# Patient Record
Sex: Female | Born: 1985 | Race: Black or African American | Hispanic: No | Marital: Single | Smoking: Never smoker
Health system: Southern US, Community
[De-identification: ages and names within clinical notes are randomized; demographics above are authoritative.]

## PROBLEM LIST (undated history)

## (undated) ENCOUNTER — Inpatient Hospital Stay (HOSPITAL_COMMUNITY): Payer: Self-pay

## (undated) DIAGNOSIS — N390 Urinary tract infection, site not specified: Secondary | ICD-10-CM

## (undated) DIAGNOSIS — B379 Candidiasis, unspecified: Secondary | ICD-10-CM

## (undated) DIAGNOSIS — B9689 Other specified bacterial agents as the cause of diseases classified elsewhere: Secondary | ICD-10-CM

## (undated) DIAGNOSIS — N760 Acute vaginitis: Secondary | ICD-10-CM

## (undated) DIAGNOSIS — A599 Trichomoniasis, unspecified: Secondary | ICD-10-CM

## (undated) DIAGNOSIS — R51 Headache: Secondary | ICD-10-CM

## (undated) DIAGNOSIS — A749 Chlamydial infection, unspecified: Secondary | ICD-10-CM

## (undated) DIAGNOSIS — A549 Gonococcal infection, unspecified: Secondary | ICD-10-CM

## (undated) HISTORY — PX: DILATION AND CURETTAGE OF UTERUS: SHX78

## (undated) HISTORY — PX: INDUCED ABORTION: SHX677

---
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
## 2005-04-19 ENCOUNTER — Inpatient Hospital Stay (HOSPITAL_COMMUNITY): Admission: AD | Admit: 2005-04-19 | Discharge: 2005-04-20 | Payer: Self-pay | Admitting: Obstetrics and Gynecology

## 2005-05-04 ENCOUNTER — Emergency Department (HOSPITAL_COMMUNITY): Admission: EM | Admit: 2005-05-04 | Discharge: 2005-05-04 | Payer: Self-pay | Admitting: Emergency Medicine

## 2005-05-22 ENCOUNTER — Inpatient Hospital Stay (HOSPITAL_COMMUNITY): Admission: AD | Admit: 2005-05-22 | Discharge: 2005-05-22 | Payer: Self-pay | Admitting: Obstetrics and Gynecology

## 2005-06-10 ENCOUNTER — Other Ambulatory Visit: Admission: RE | Admit: 2005-06-10 | Discharge: 2005-06-10 | Payer: Self-pay | Admitting: Obstetrics and Gynecology

## 2005-08-17 ENCOUNTER — Emergency Department (HOSPITAL_COMMUNITY): Admission: EM | Admit: 2005-08-17 | Discharge: 2005-08-17 | Payer: Self-pay | Admitting: Emergency Medicine

## 2005-08-23 ENCOUNTER — Emergency Department (HOSPITAL_COMMUNITY): Admission: EM | Admit: 2005-08-23 | Discharge: 2005-08-23 | Payer: Self-pay | Admitting: Emergency Medicine

## 2006-11-07 ENCOUNTER — Inpatient Hospital Stay (HOSPITAL_COMMUNITY): Admission: AD | Admit: 2006-11-07 | Discharge: 2006-11-07 | Payer: Self-pay | Admitting: Family Medicine

## 2007-01-16 ENCOUNTER — Inpatient Hospital Stay (HOSPITAL_COMMUNITY): Admission: AD | Admit: 2007-01-16 | Discharge: 2007-01-16 | Payer: Self-pay | Admitting: Gynecology

## 2007-06-18 ENCOUNTER — Inpatient Hospital Stay (HOSPITAL_COMMUNITY): Admission: AD | Admit: 2007-06-18 | Discharge: 2007-06-18 | Payer: Self-pay | Admitting: Obstetrics and Gynecology

## 2007-06-25 ENCOUNTER — Inpatient Hospital Stay (HOSPITAL_COMMUNITY): Admission: AD | Admit: 2007-06-25 | Discharge: 2007-06-26 | Payer: Self-pay | Admitting: Obstetrics & Gynecology

## 2007-06-30 ENCOUNTER — Inpatient Hospital Stay (HOSPITAL_COMMUNITY): Admission: AD | Admit: 2007-06-30 | Discharge: 2007-06-30 | Payer: Self-pay | Admitting: Obstetrics and Gynecology

## 2007-07-04 ENCOUNTER — Inpatient Hospital Stay (HOSPITAL_COMMUNITY): Admission: AD | Admit: 2007-07-04 | Discharge: 2007-07-06 | Payer: Self-pay | Admitting: Obstetrics and Gynecology

## 2007-08-08 ENCOUNTER — Emergency Department (HOSPITAL_COMMUNITY): Admission: EM | Admit: 2007-08-08 | Discharge: 2007-08-08 | Payer: Self-pay | Admitting: Emergency Medicine

## 2007-10-06 ENCOUNTER — Inpatient Hospital Stay (HOSPITAL_COMMUNITY): Admission: AD | Admit: 2007-10-06 | Discharge: 2007-10-06 | Payer: Self-pay | Admitting: Obstetrics & Gynecology

## 2007-11-17 ENCOUNTER — Inpatient Hospital Stay (HOSPITAL_COMMUNITY): Admission: AD | Admit: 2007-11-17 | Discharge: 2007-11-17 | Payer: Self-pay | Admitting: Obstetrics & Gynecology

## 2008-05-18 ENCOUNTER — Inpatient Hospital Stay (HOSPITAL_COMMUNITY): Admission: AD | Admit: 2008-05-18 | Discharge: 2008-05-18 | Payer: Self-pay | Admitting: Obstetrics & Gynecology

## 2009-01-13 ENCOUNTER — Ambulatory Visit: Payer: Self-pay | Admitting: Physician Assistant

## 2009-01-13 ENCOUNTER — Inpatient Hospital Stay (HOSPITAL_COMMUNITY): Admission: AD | Admit: 2009-01-13 | Discharge: 2009-01-13 | Payer: Self-pay | Admitting: Obstetrics & Gynecology

## 2009-01-15 ENCOUNTER — Inpatient Hospital Stay (HOSPITAL_COMMUNITY): Admission: AD | Admit: 2009-01-15 | Discharge: 2009-01-15 | Payer: Self-pay | Admitting: Obstetrics & Gynecology

## 2009-04-06 ENCOUNTER — Inpatient Hospital Stay (HOSPITAL_COMMUNITY): Admission: AD | Admit: 2009-04-06 | Discharge: 2009-04-06 | Payer: Self-pay | Admitting: Family Medicine

## 2009-07-01 ENCOUNTER — Emergency Department (HOSPITAL_COMMUNITY): Admission: EM | Admit: 2009-07-01 | Discharge: 2009-07-01 | Payer: Self-pay | Admitting: Emergency Medicine

## 2009-08-07 ENCOUNTER — Inpatient Hospital Stay (HOSPITAL_COMMUNITY): Admission: AD | Admit: 2009-08-07 | Discharge: 2009-08-07 | Payer: Self-pay | Admitting: Obstetrics & Gynecology

## 2009-08-19 ENCOUNTER — Inpatient Hospital Stay (HOSPITAL_COMMUNITY): Admission: AD | Admit: 2009-08-19 | Discharge: 2009-08-19 | Payer: Self-pay | Admitting: Obstetrics & Gynecology

## 2009-11-27 ENCOUNTER — Emergency Department (HOSPITAL_COMMUNITY): Admission: EM | Admit: 2009-11-27 | Discharge: 2009-11-27 | Payer: Self-pay | Admitting: Family Medicine

## 2010-01-15 ENCOUNTER — Inpatient Hospital Stay (HOSPITAL_COMMUNITY): Admission: AD | Admit: 2010-01-15 | Discharge: 2010-01-15 | Payer: Self-pay | Admitting: Family Medicine

## 2010-04-22 ENCOUNTER — Emergency Department (HOSPITAL_COMMUNITY): Admission: EM | Admit: 2010-04-22 | Discharge: 2010-04-22 | Payer: Self-pay | Admitting: Emergency Medicine

## 2010-06-26 ENCOUNTER — Emergency Department (HOSPITAL_COMMUNITY): Admission: EM | Admit: 2010-06-26 | Discharge: 2010-06-26 | Payer: Self-pay | Admitting: Emergency Medicine

## 2010-08-08 ENCOUNTER — Inpatient Hospital Stay (HOSPITAL_COMMUNITY)
Admission: AD | Admit: 2010-08-08 | Discharge: 2010-08-08 | Payer: Self-pay | Source: Home / Self Care | Attending: Obstetrics and Gynecology | Admitting: Obstetrics and Gynecology

## 2010-09-25 ENCOUNTER — Inpatient Hospital Stay (INDEPENDENT_AMBULATORY_CARE_PROVIDER_SITE_OTHER)
Admission: RE | Admit: 2010-09-25 | Discharge: 2010-09-25 | Disposition: A | Discharge status: Home / Self Care | Payer: Self-pay | Source: Ambulatory Visit | Attending: Family Medicine | Admitting: Family Medicine

## 2010-09-25 LAB — WET PREP, GENITAL
Trich, Wet Prep: NONE SEEN
Yeast Wet Prep HPF POC: NONE SEEN

## 2010-09-25 LAB — POCT URINALYSIS DIPSTICK
Hgb urine dipstick: NEGATIVE
Ketones, ur: NEGATIVE mg/dL
Nitrite: NEGATIVE
Protein, ur: 30 mg/dL — AB
Specific Gravity, Urine: 1.02 (ref 1.005–1.030)
Urine Glucose, Fasting: NEGATIVE mg/dL
Urobilinogen, UA: 2 mg/dL — ABNORMAL HIGH (ref 0.0–1.0)
pH: 7 (ref 5.0–8.0)

## 2010-09-25 LAB — POCT PREGNANCY, URINE: Preg Test, Ur: NEGATIVE

## 2010-09-26 LAB — URINE CULTURE
Colony Count: 5000
Culture  Setup Time: 201202141838

## 2010-09-26 LAB — GC/CHLAMYDIA PROBE AMP, GENITAL
Chlamydia, DNA Probe: NEGATIVE
GC Probe Amp, Genital: NEGATIVE

## 2010-10-22 LAB — URINALYSIS, ROUTINE W REFLEX MICROSCOPIC
Bilirubin Urine: NEGATIVE
Glucose, UA: NEGATIVE mg/dL
Hgb urine dipstick: NEGATIVE
Ketones, ur: NEGATIVE mg/dL
Nitrite: NEGATIVE
Protein, ur: NEGATIVE mg/dL
Specific Gravity, Urine: 1.025 (ref 1.005–1.030)
Urobilinogen, UA: 0.2 mg/dL (ref 0.0–1.0)
pH: 6 (ref 5.0–8.0)

## 2010-10-22 LAB — POCT PREGNANCY, URINE: Preg Test, Ur: NEGATIVE

## 2010-10-23 LAB — POCT URINALYSIS DIPSTICK
Bilirubin Urine: NEGATIVE
Glucose, UA: NEGATIVE mg/dL
Hgb urine dipstick: NEGATIVE
Ketones, ur: NEGATIVE mg/dL
Nitrite: NEGATIVE
Protein, ur: NEGATIVE mg/dL
Specific Gravity, Urine: 1.015 (ref 1.005–1.030)
Urobilinogen, UA: 0.2 mg/dL (ref 0.0–1.0)
pH: 6 (ref 5.0–8.0)

## 2010-10-23 LAB — WET PREP, GENITAL
Trich, Wet Prep: NONE SEEN
Yeast Wet Prep HPF POC: NONE SEEN

## 2010-10-23 LAB — GC/CHLAMYDIA PROBE AMP, GENITAL
Chlamydia, DNA Probe: NEGATIVE
GC Probe Amp, Genital: NEGATIVE

## 2010-10-23 LAB — POCT PREGNANCY, URINE: Preg Test, Ur: NEGATIVE

## 2010-10-25 LAB — URINE CULTURE
Colony Count: 30000
Culture  Setup Time: 201109112212

## 2010-10-25 LAB — POCT PREGNANCY, URINE: Preg Test, Ur: NEGATIVE

## 2010-10-25 LAB — GC/CHLAMYDIA PROBE AMP, GENITAL
Chlamydia, DNA Probe: NEGATIVE
GC Probe Amp, Genital: NEGATIVE

## 2010-10-25 LAB — POCT URINALYSIS DIPSTICK
Bilirubin Urine: NEGATIVE
Glucose, UA: NEGATIVE mg/dL
Hgb urine dipstick: NEGATIVE
Ketones, ur: NEGATIVE mg/dL
Nitrite: NEGATIVE
Protein, ur: NEGATIVE mg/dL
Specific Gravity, Urine: 1.02 (ref 1.005–1.030)
Urobilinogen, UA: 1 mg/dL (ref 0.0–1.0)
pH: 6 (ref 5.0–8.0)

## 2010-10-25 LAB — WET PREP, GENITAL: Trich, Wet Prep: NONE SEEN

## 2010-10-28 LAB — URINALYSIS, ROUTINE W REFLEX MICROSCOPIC
Bilirubin Urine: NEGATIVE
Glucose, UA: NEGATIVE mg/dL
Hgb urine dipstick: NEGATIVE
Ketones, ur: NEGATIVE mg/dL
Nitrite: NEGATIVE
Protein, ur: NEGATIVE mg/dL
Specific Gravity, Urine: <1.005 — ABNORMAL LOW (ref 1.005–1.030)
Urobilinogen, UA: 0.2 mg/dL (ref 0.0–1.0)
pH: 5 (ref 5.0–8.0)

## 2010-10-28 LAB — HERPES SIMPLEX VIRUS CULTURE: Culture: NOT DETECTED

## 2010-10-28 LAB — URINE CULTURE

## 2010-10-28 LAB — WET PREP, GENITAL
Trich, Wet Prep: NONE SEEN
Yeast Wet Prep HPF POC: NONE SEEN

## 2010-10-28 LAB — URINE MICROSCOPIC-ADD ON

## 2010-10-28 LAB — GC/CHLAMYDIA PROBE AMP, GENITAL
Chlamydia, DNA Probe: POSITIVE — AB
GC Probe Amp, Genital: NEGATIVE

## 2010-10-29 LAB — URINE MICROSCOPIC-ADD ON

## 2010-10-29 LAB — URINALYSIS, ROUTINE W REFLEX MICROSCOPIC
Bilirubin Urine: NEGATIVE
Glucose, UA: NEGATIVE mg/dL
Ketones, ur: NEGATIVE mg/dL
Nitrite: NEGATIVE
Protein, ur: NEGATIVE mg/dL
Specific Gravity, Urine: 1.02 (ref 1.005–1.030)
Urobilinogen, UA: 0.2 mg/dL (ref 0.0–1.0)
pH: 6 (ref 5.0–8.0)

## 2010-10-29 LAB — GC/CHLAMYDIA PROBE AMP, GENITAL
Chlamydia, DNA Probe: NEGATIVE
GC Probe Amp, Genital: NEGATIVE

## 2010-10-29 LAB — WET PREP, GENITAL
Trich, Wet Prep: NONE SEEN
Yeast Wet Prep HPF POC: NONE SEEN

## 2010-10-29 LAB — POCT PREGNANCY, URINE: Preg Test, Ur: NEGATIVE

## 2010-10-30 LAB — POCT PREGNANCY, URINE: Preg Test, Ur: NEGATIVE

## 2010-10-30 LAB — GC/CHLAMYDIA PROBE AMP, GENITAL
Chlamydia, DNA Probe: NEGATIVE
GC Probe Amp, Genital: NEGATIVE

## 2010-10-30 LAB — POCT URINALYSIS DIP (DEVICE)
Bilirubin Urine: NEGATIVE
Glucose, UA: NEGATIVE mg/dL
Ketones, ur: NEGATIVE mg/dL
Nitrite: NEGATIVE
Protein, ur: NEGATIVE mg/dL
Specific Gravity, Urine: <=1.005 (ref 1.005–1.030)
Urobilinogen, UA: 0.2 mg/dL (ref 0.0–1.0)
pH: 5.5 (ref 5.0–8.0)

## 2010-11-12 LAB — URINALYSIS, ROUTINE W REFLEX MICROSCOPIC
Bilirubin Urine: NEGATIVE
Glucose, UA: NEGATIVE mg/dL
Hgb urine dipstick: NEGATIVE
Ketones, ur: NEGATIVE mg/dL
Nitrite: NEGATIVE
Protein, ur: NEGATIVE mg/dL
Specific Gravity, Urine: >1.03 — ABNORMAL HIGH (ref 1.005–1.030)
Urobilinogen, UA: 0.2 mg/dL (ref 0.0–1.0)
pH: 6 (ref 5.0–8.0)

## 2010-11-12 LAB — COMPREHENSIVE METABOLIC PANEL
ALT: 16 U/L (ref 0–35)
AST: 20 U/L (ref 0–37)
Albumin: 4 g/dL (ref 3.5–5.2)
Alkaline Phosphatase: 35 U/L — ABNORMAL LOW (ref 39–117)
BUN: 10 mg/dL (ref 6–23)
CO2: 26 mEq/L (ref 19–32)
Calcium: 8.9 mg/dL (ref 8.4–10.5)
Chloride: 105 mEq/L (ref 96–112)
Creatinine, Ser: 0.81 mg/dL (ref 0.4–1.2)
GFR calc Af Amer: >60 mL/min (ref >60)
GFR calc non Af Amer: >60 mL/min (ref >60)
Glucose, Bld: 82 mg/dL (ref 70–99)
Potassium: 4.1 mEq/L (ref 3.5–5.1)
Sodium: 135 mEq/L (ref 135–145)
Total Bilirubin: 1 mg/dL (ref 0.3–1.2)
Total Protein: 7.3 g/dL (ref 6.0–8.3)

## 2010-11-12 LAB — CBC
HCT: 38.6 % (ref 36.0–46.0)
Hemoglobin: 12.2 g/dL (ref 12.0–15.0)
MCHC: 31.7 g/dL (ref 30.0–36.0)
MCV: 79.6 fL (ref 78.0–100.0)
Platelets: 305 10*3/uL (ref 150–400)
RBC: 4.85 MIL/uL (ref 3.87–5.11)
RDW: 14.8 % (ref 11.5–15.5)
WBC: 7.9 10*3/uL (ref 4.0–10.5)

## 2010-11-12 LAB — POCT PREGNANCY, URINE: Preg Test, Ur: POSITIVE

## 2010-11-14 LAB — POCT URINALYSIS DIP (DEVICE)
Bilirubin Urine: NEGATIVE
Glucose, UA: NEGATIVE mg/dL
Hgb urine dipstick: NEGATIVE
Ketones, ur: NEGATIVE mg/dL
Nitrite: NEGATIVE
Protein, ur: NEGATIVE mg/dL
Specific Gravity, Urine: 1.02 (ref 1.005–1.030)
Urobilinogen, UA: 1 mg/dL (ref 0.0–1.0)
pH: 5.5 (ref 5.0–8.0)

## 2010-11-14 LAB — GC/CHLAMYDIA PROBE AMP, GENITAL
Chlamydia, DNA Probe: NEGATIVE
GC Probe Amp, Genital: NEGATIVE

## 2010-11-14 LAB — WET PREP, GENITAL
Clue Cells Wet Prep HPF POC: NONE SEEN
Trich, Wet Prep: NONE SEEN
Yeast Wet Prep HPF POC: NONE SEEN

## 2010-11-14 LAB — POCT PREGNANCY, URINE: Preg Test, Ur: NEGATIVE

## 2010-11-17 LAB — CBC
HCT: 37.7 % (ref 36.0–46.0)
Hemoglobin: 12.1 g/dL (ref 12.0–15.0)
MCHC: 32 g/dL (ref 30.0–36.0)
MCV: 77.3 fL — ABNORMAL LOW (ref 78.0–100.0)
Platelets: 289 10*3/uL (ref 150–400)
RBC: 4.88 MIL/uL (ref 3.87–5.11)
RDW: 16.4 % — ABNORMAL HIGH (ref 11.5–15.5)
WBC: 8 10*3/uL (ref 4.0–10.5)

## 2010-11-17 LAB — WET PREP, GENITAL
Trich, Wet Prep: NONE SEEN
Yeast Wet Prep HPF POC: NONE SEEN

## 2010-11-17 LAB — URINALYSIS, ROUTINE W REFLEX MICROSCOPIC
Bilirubin Urine: NEGATIVE
Glucose, UA: NEGATIVE mg/dL
Ketones, ur: 15 mg/dL — AB
Nitrite: NEGATIVE
Protein, ur: NEGATIVE mg/dL
Specific Gravity, Urine: >1.03 — ABNORMAL HIGH (ref 1.005–1.030)
Urobilinogen, UA: 2 mg/dL — ABNORMAL HIGH (ref 0.0–1.0)
pH: 6 (ref 5.0–8.0)

## 2010-11-17 LAB — ABO/RH: ABO/RH(D): A POS

## 2010-11-17 LAB — POCT PREGNANCY, URINE: Preg Test, Ur: POSITIVE

## 2010-11-17 LAB — GC/CHLAMYDIA PROBE AMP, GENITAL
Chlamydia, DNA Probe: NEGATIVE
GC Probe Amp, Genital: NEGATIVE

## 2010-11-17 LAB — URINE MICROSCOPIC-ADD ON

## 2010-11-17 LAB — HCG, QUANTITATIVE, PREGNANCY: hCG, Beta Chain, Quant, S: 52968 m[IU]/mL — ABNORMAL HIGH (ref <5)

## 2010-11-19 LAB — URINE MICROSCOPIC-ADD ON: RBC / HPF: NONE SEEN RBC/hpf (ref <3)

## 2010-11-19 LAB — URINALYSIS, ROUTINE W REFLEX MICROSCOPIC
Bilirubin Urine: NEGATIVE
Glucose, UA: NEGATIVE mg/dL
Hgb urine dipstick: NEGATIVE
Ketones, ur: NEGATIVE mg/dL
Nitrite: NEGATIVE
Protein, ur: NEGATIVE mg/dL
Specific Gravity, Urine: 1.015 (ref 1.005–1.030)
Urobilinogen, UA: 0.2 mg/dL (ref 0.0–1.0)
pH: 6 (ref 5.0–8.0)

## 2010-11-19 LAB — WET PREP, GENITAL
Clue Cells Wet Prep HPF POC: NONE SEEN
Trich, Wet Prep: NONE SEEN
Yeast Wet Prep HPF POC: NONE SEEN

## 2010-11-19 LAB — GC/CHLAMYDIA PROBE AMP, GENITAL
Chlamydia, DNA Probe: POSITIVE — AB
GC Probe Amp, Genital: POSITIVE — AB

## 2010-12-01 ENCOUNTER — Inpatient Hospital Stay (HOSPITAL_COMMUNITY)
Admission: RE | Admit: 2010-12-01 | Discharge: 2010-12-01 | Disposition: A | Discharge status: Home / Self Care | Payer: Self-pay | Source: Ambulatory Visit | Attending: Obstetrics and Gynecology | Admitting: Obstetrics and Gynecology

## 2010-12-01 DIAGNOSIS — A499 Bacterial infection, unspecified: Secondary | ICD-10-CM | POA: Insufficient documentation

## 2010-12-01 DIAGNOSIS — B9689 Other specified bacterial agents as the cause of diseases classified elsewhere: Secondary | ICD-10-CM | POA: Insufficient documentation

## 2010-12-01 DIAGNOSIS — R109 Unspecified abdominal pain: Secondary | ICD-10-CM

## 2010-12-01 DIAGNOSIS — N390 Urinary tract infection, site not specified: Secondary | ICD-10-CM | POA: Insufficient documentation

## 2010-12-01 DIAGNOSIS — N760 Acute vaginitis: Secondary | ICD-10-CM | POA: Insufficient documentation

## 2010-12-01 LAB — WET PREP, GENITAL
Trich, Wet Prep: NONE SEEN
Yeast Wet Prep HPF POC: NONE SEEN

## 2010-12-01 LAB — URINALYSIS, ROUTINE W REFLEX MICROSCOPIC
Bilirubin Urine: NEGATIVE
Glucose, UA: NEGATIVE mg/dL
Hgb urine dipstick: NEGATIVE
Ketones, ur: 15 mg/dL — AB
Nitrite: NEGATIVE
Protein, ur: NEGATIVE mg/dL
Specific Gravity, Urine: 1.025 (ref 1.005–1.030)
Urobilinogen, UA: 0.2 mg/dL (ref 0.0–1.0)
pH: 6 (ref 5.0–8.0)

## 2010-12-01 LAB — URINE MICROSCOPIC-ADD ON

## 2010-12-01 LAB — POCT PREGNANCY, URINE: Preg Test, Ur: NEGATIVE

## 2010-12-02 LAB — URINE CULTURE
Colony Count: 45000
Culture  Setup Time: 201204211141

## 2010-12-03 LAB — GC/CHLAMYDIA PROBE AMP, GENITAL
Chlamydia, DNA Probe: NEGATIVE
GC Probe Amp, Genital: NEGATIVE

## 2011-01-23 ENCOUNTER — Inpatient Hospital Stay (INDEPENDENT_AMBULATORY_CARE_PROVIDER_SITE_OTHER)
Admission: RE | Admit: 2011-01-23 | Discharge: 2011-01-23 | Disposition: A | Discharge status: Home / Self Care | Payer: Self-pay | Source: Ambulatory Visit | Attending: Family Medicine | Admitting: Family Medicine

## 2011-01-23 DIAGNOSIS — L03319 Cellulitis of trunk, unspecified: Secondary | ICD-10-CM

## 2011-01-23 DIAGNOSIS — L02219 Cutaneous abscess of trunk, unspecified: Secondary | ICD-10-CM

## 2011-01-23 LAB — POCT URINALYSIS DIP (DEVICE)
Bilirubin Urine: NEGATIVE
Glucose, UA: NEGATIVE mg/dL
Hgb urine dipstick: NEGATIVE
Ketones, ur: NEGATIVE mg/dL
Nitrite: NEGATIVE
Protein, ur: NEGATIVE mg/dL
Specific Gravity, Urine: 1.02 (ref 1.005–1.030)
Urobilinogen, UA: 1 mg/dL (ref 0.0–1.0)
pH: 7.5 (ref 5.0–8.0)

## 2011-01-23 LAB — POCT PREGNANCY, URINE: Preg Test, Ur: NEGATIVE

## 2011-01-26 LAB — CULTURE, ROUTINE-ABSCESS

## 2011-02-03 ENCOUNTER — Inpatient Hospital Stay (INDEPENDENT_AMBULATORY_CARE_PROVIDER_SITE_OTHER)
Admission: RE | Admit: 2011-02-03 | Discharge: 2011-02-03 | Disposition: A | Discharge status: Home / Self Care | Payer: Self-pay | Source: Ambulatory Visit | Attending: Emergency Medicine | Admitting: Emergency Medicine

## 2011-02-03 DIAGNOSIS — L03319 Cellulitis of trunk, unspecified: Secondary | ICD-10-CM

## 2011-02-03 DIAGNOSIS — L02219 Cutaneous abscess of trunk, unspecified: Secondary | ICD-10-CM

## 2011-02-03 DIAGNOSIS — N390 Urinary tract infection, site not specified: Secondary | ICD-10-CM

## 2011-02-03 LAB — POCT PREGNANCY, URINE: Preg Test, Ur: NEGATIVE

## 2011-02-03 LAB — POCT URINALYSIS DIP (DEVICE)
Glucose, UA: NEGATIVE mg/dL
Ketones, ur: NEGATIVE mg/dL
Nitrite: POSITIVE — AB
Protein, ur: 30 mg/dL — AB
Specific Gravity, Urine: >=1.03 (ref 1.005–1.030)
Urobilinogen, UA: 1 mg/dL (ref 0.0–1.0)
pH: 5.5 (ref 5.0–8.0)

## 2011-03-08 ENCOUNTER — Inpatient Hospital Stay (HOSPITAL_COMMUNITY)
Admission: AD | Admit: 2011-03-08 | Discharge: 2011-03-08 | Disposition: A | Discharge status: Home / Self Care | Payer: Self-pay | Source: Ambulatory Visit | Attending: Obstetrics & Gynecology | Admitting: Obstetrics & Gynecology

## 2011-03-08 ENCOUNTER — Inpatient Hospital Stay (HOSPITAL_COMMUNITY): Payer: Self-pay

## 2011-03-08 ENCOUNTER — Encounter (HOSPITAL_COMMUNITY): Payer: Self-pay | Admitting: *Deleted

## 2011-03-08 ENCOUNTER — Inpatient Hospital Stay (INDEPENDENT_AMBULATORY_CARE_PROVIDER_SITE_OTHER)
Admission: RE | Admit: 2011-03-08 | Discharge: 2011-03-08 | Disposition: A | Discharge status: Home / Self Care | Payer: Self-pay | Source: Ambulatory Visit | Attending: Family Medicine | Admitting: Family Medicine

## 2011-03-08 DIAGNOSIS — R1032 Left lower quadrant pain: Secondary | ICD-10-CM

## 2011-03-08 DIAGNOSIS — O9989 Other specified diseases and conditions complicating pregnancy, childbirth and the puerperium: Secondary | ICD-10-CM | POA: Insufficient documentation

## 2011-03-08 DIAGNOSIS — N949 Unspecified condition associated with female genital organs and menstrual cycle: Secondary | ICD-10-CM

## 2011-03-08 LAB — POCT URINALYSIS DIP (DEVICE)
Glucose, UA: NEGATIVE mg/dL
Nitrite: NEGATIVE
Protein, ur: 30 mg/dL — AB
Specific Gravity, Urine: >=1.03 (ref 1.005–1.030)
Urobilinogen, UA: 2 mg/dL — ABNORMAL HIGH (ref 0.0–1.0)
pH: 6 (ref 5.0–8.0)

## 2011-03-08 LAB — WET PREP, GENITAL
Trich, Wet Prep: NONE SEEN
Yeast Wet Prep HPF POC: NONE SEEN

## 2011-03-08 LAB — POCT PREGNANCY, URINE: Preg Test, Ur: NEGATIVE

## 2011-03-08 NOTE — Progress Notes (Signed)
Vag discharge x 1-2 weeks, no burning or itching.  Took OTC BV treatment - had intense burning & itching, then started bleeding.  Seen at [facility] today, dx'd with UTI, given prescriptions for Bactrim & Flagyl, sent here for U/S.

## 2011-03-08 NOTE — ED Notes (Signed)
Pt states she has no appetite & has not eaten in 2 days.  Denies N&V.  Has not had BM since Monday.

## 2011-03-08 NOTE — Progress Notes (Signed)
Pt states, " I was just seen a Urgent care and they sent me here for an [place]. They said they called ahead."

## 2011-03-08 NOTE — ED Provider Notes (Signed)
History   Pt was sent from [facility] for pelvic US. She was seen for vag dc and was diagnosed with a UTI and BV. However, on exam she was tender to palpation in left adnexa. There was some concern of ovarian torsion so she presents for evaluation. She denies pain at this time and states the pain is mainly when she had her exam.  Chief Complaint  Patient presents with  . Abdominal Pain   HPI  OB History    Grav Para Term Preterm Abortions TAB SAB Ect Mult Living   3 1 1  2 1 1   1       Past Medical History  Diagnosis Date  . Asthma     Past Surgical History  Procedure Date  . No past surgeries     No family history on file.  History  Substance Use Topics  . Smoking status: Never Smoker   . Smokeless tobacco: Not on file  . Alcohol Use: Yes     occasional alcohol    Allergies:  Allergies  Allergen Reactions  . Latex Itching    Burning   . Tylenol (Acetaminophen) Hives    Prescriptions prior to admission  Medication Sig Dispense Refill  . cephALEXin (KEFLEX) 500 MG capsule Take 500 mg by mouth 2 (two) times daily.        Marland Kitchen OVER THE COUNTER MEDICATION Place 1 suppository vaginally once. Used one time       . Tioconazole (MONISTAT 1 VA) Place 1 suppository vaginally once. Used once 2 weeks ago for yeast infection         Review of Systems  Constitutional: Negative for fever.  Cardiovascular: Negative for chest pain and palpitations.  Gastrointestinal: Positive for abdominal pain. Negative for nausea, vomiting, diarrhea and constipation.  Genitourinary: Negative for dysuria, urgency, frequency, hematuria and flank pain.  Neurological: Negative for dizziness and headaches.  Psychiatric/Behavioral: Negative for depression and suicidal ideas.   Physical Exam   Blood pressure 112/69, pulse 83, temperature 99.1 F (37.3 C), temperature source Oral, height 5' 1.5" (1.562 m), weight 149 lb 5 oz (67.728 kg), last menstrual period 03/05/2011.  Physical Exam    Constitutional: She is oriented to person, place, and time. She appears well-developed and well-nourished. No distress.  HENT:  Head: Normocephalic and atraumatic.  Eyes: EOM are normal. Pupils are equal, round, and reactive to light.  GI: Soft. She exhibits no distension and no mass. There is no tenderness. There is no rebound and no guarding.  Neurological: She is alert and oriented to person, place, and time.  Skin: Skin is warm and dry. She is not diaphoretic.  Psychiatric: She has a normal mood and affect. Her behavior is normal. Judgment and thought content normal.    MAU Course  Procedures  Results for orders placed during the hospital encounter of 03/08/11 (from the past 24 hour(s))  POCT URINALYSIS DIP (DEVICE)     Status: Abnormal   Collection Time   03/08/11  2:02 PM      Component Value Range   Glucose, UA NEGATIVE  NEGATIVE (mg/dL)   Bilirubin Urine MODERATE (*) NEGATIVE    Ketones, ur TRACE (*) NEGATIVE (mg/dL)   Specific Gravity, Urine >=1.030  1.005 - 1.030    Hgb urine dipstick LARGE (*) NEGATIVE    pH 6.0  5.0 - 8.0    Protein, ur 30 (*) NEGATIVE (mg/dL)   Urobilinogen, UA 2.0 (*) 0.0 - 1.0 (mg/dL)   Nitrite NEGATIVE  NEGATIVE    Leukocytes, UA MODERATE (*) NEGATIVE   POCT PREGNANCY, URINE     Status: Normal   Collection Time   03/08/11  2:10 PM      Component Value Range   Preg Test, Ur NEGATIVE    WET PREP, GENITAL     Status: Abnormal   Collection Time   03/08/11  2:34 PM      Component Value Range   Yeast, Wet Prep NONE SEEN  NONE SEEN    Trich, Wet Prep NONE SEEN  NONE SEEN    Clue Cells, Wet Prep FEW (*) NONE SEEN    WBC, Wet Prep HPF POC TOO NUMEROUS TO COUNT (*) NONE SEEN    US Transvaginal Non-ob  03/08/2011  *RADIOLOGY REPORT*  Clinical Data: Pelvic pain  TRANSABDOMINAL AND TRANSVAGINAL ULTRASOUND OF PELVIS  Technique:  Both transabdominal and transvaginal ultrasound examinations of the pelvis were performed.  Transabdominal technique was  performed for global imaging of the pelvis including uterus, ovaries, adnexal regions, and pelvic cul-de-sac.  It was necessary to proceed with endovaginal exam following the transabdominal exam to visualize the endometrium.  Comparison:  04/06/2009  Findings: Uterus:  7.5 x 3.7 x 4.4 cm.  No fibroids or other uterine masses identified.  Endometrium:  Measures 5.4 mm.  Normal in appearance.  Right ovary:  Measures 3.1 x 2.  2 x 210 0 cm.  Normal appearance/no adnexal mass.  Left ovary:  Measures 2.6 x 1.7 x 1.5 cm.  Normal appearance/no adnexal mass.  Other findings:  No free fluid.  IMPRESSION: Normal study.  No evidence of pelvic mass or other significant abnormality.  Original Report Authenticated By: Rosealee Albee, M.D.   US Pelvis Complete  03/08/2011  *RADIOLOGY REPORT*  Clinical Data: Pelvic pain  TRANSABDOMINAL AND TRANSVAGINAL ULTRASOUND OF PELVIS  Technique:  Both transabdominal and transvaginal ultrasound examinations of the pelvis were performed.  Transabdominal technique was performed for global imaging of the pelvis including uterus, ovaries, adnexal regions, and pelvic cul-de-sac.  It was necessary to proceed with endovaginal exam following the transabdominal exam to visualize the endometrium.  Comparison:  04/06/2009  Findings: Uterus:  7.5 x 3.7 x 4.4 cm.  No fibroids or other uterine masses identified.  Endometrium:  Measures 5.4 mm.  Normal in appearance.  Right ovary:  Measures 3.1 x 2.  2 x 210 0 cm.  Normal appearance/no adnexal mass.  Left ovary:  Measures 2.6 x 1.7 x 1.5 cm.  Normal appearance/no adnexal mass.  Other findings:  No free fluid.  IMPRESSION: Normal study.  No evidence of pelvic mass or other significant abnormality.  Original Report Authenticated By: Rosealee Albee, M.D.    Assessment and Plan  UTI/BV: pt was already given Rx for Flagyl and Bactrim. She will take these as directed. She will f/u with her PCP. Discussed diet, activity, risks, and precautions.  Clinton Gallant. Lachina Salsberry III, DrHSc, MPAS, PA-C  03/08/2011, 6:57 PM   Henrietta Hoover, PA 03/08/11 1901

## 2011-03-09 LAB — URINE CULTURE
Colony Count: 75000
Culture  Setup Time: 201207271518

## 2011-03-09 LAB — GC/CHLAMYDIA PROBE AMP, GENITAL
Chlamydia, DNA Probe: NEGATIVE
GC Probe Amp, Genital: NEGATIVE

## 2011-04-07 ENCOUNTER — Encounter (HOSPITAL_COMMUNITY): Payer: Self-pay | Admitting: *Deleted

## 2011-04-07 ENCOUNTER — Inpatient Hospital Stay (HOSPITAL_COMMUNITY)
Admission: AD | Admit: 2011-04-07 | Discharge: 2011-04-07 | Disposition: A | Discharge status: Home / Self Care | Payer: Self-pay | Source: Ambulatory Visit | Attending: Obstetrics and Gynecology | Admitting: Obstetrics and Gynecology

## 2011-04-07 DIAGNOSIS — O219 Vomiting of pregnancy, unspecified: Secondary | ICD-10-CM | POA: Insufficient documentation

## 2011-04-07 DIAGNOSIS — Z3201 Encounter for pregnancy test, result positive: Secondary | ICD-10-CM | POA: Insufficient documentation

## 2011-04-07 LAB — URINALYSIS, ROUTINE W REFLEX MICROSCOPIC
Bilirubin Urine: NEGATIVE
Glucose, UA: NEGATIVE mg/dL
Hgb urine dipstick: NEGATIVE
Ketones, ur: NEGATIVE mg/dL
Leukocytes, UA: NEGATIVE
Nitrite: NEGATIVE
Protein, ur: NEGATIVE mg/dL
Specific Gravity, Urine: 1.025 (ref 1.005–1.030)
Urobilinogen, UA: 4 mg/dL — ABNORMAL HIGH (ref 0.0–1.0)
pH: 6 (ref 5.0–8.0)

## 2011-04-07 LAB — POCT PREGNANCY, URINE: Preg Test, Ur: POSITIVE

## 2011-04-07 LAB — WET PREP, GENITAL
Trich, Wet Prep: NONE SEEN
Yeast Wet Prep HPF POC: NONE SEEN

## 2011-04-07 MED ORDER — ONDANSETRON 4 MG PO TBDP: 4.0000 mg | ORAL_TABLET | Freq: Three times a day (TID) | ORAL | Status: AC | PRN

## 2011-04-07 MED ORDER — PRENATAL RX 60-1 MG PO TABS: 1.0000 | ORAL_TABLET | Freq: Every day | ORAL | Status: DC

## 2011-04-07 NOTE — Progress Notes (Signed)
Pt complains of nausea x 2 weeks with only 2 epsiodes of vomiting in 2 weeks-states had a neg UPT at [facility]

## 2011-04-07 NOTE — ED Provider Notes (Signed)
History     Chief Complaint  Patient presents with  . Dysmenorrhea  . Nausea   HPI  Pt is seen for confirmation of pregnancy.  Lmp is 03/05/2011 ,had unprotected sex 8/3, and 8/10. Had negative home preg test this am. Noted nausea and chose to come in for confirmation of suspected pregnancy. Denies bleeding or pain. +nausea.  Pertinent Gynecological History: Menses: usually lasting 3 to 5 days Bleeding:  Contraception: none DES exposure: denies Blood transfusions: none Sexually transmitted diseases: unknown Previous GYN Procedures: DNC  Last mammogram:  Date:  Last pap:  Date:    Past Medical History  Diagnosis Date  . Asthma     Past Surgical History  Procedure Date  . No past surgeries     No family history on file.  History  Substance Use Topics  . Smoking status: Never Smoker   . Smokeless tobacco: Not on file  . Alcohol Use: Yes     occasional alcohol    Allergies:  Allergies  Allergen Reactions  . Latex Itching    Burning   . Tylenol (Acetaminophen) Hives    Prescriptions prior to admission  Medication Sig Dispense Refill  . cephALEXin (KEFLEX) 500 MG capsule Take 500 mg by mouth 2 (two) times daily.        . MetroNIDAZOLE (FLAGYL PO) Take 2 tablets by mouth 2 (two) times daily. For seven days       . OVER THE COUNTER MEDICATION Place 1 suppository vaginally once. Used one time for bacterial infection      . Sulfamethoxazole-Trimethoprim (BACTRIM PO) Take 1 tablet by mouth 2 (two) times daily.        . Tioconazole (MONISTAT 1 VA) Place 1 suppository vaginally once. Used once 2 weeks ago for yeast infection         ROSnegative for gyn concerns at present Physical Exam Physical Examination: Mental status - alert, oriented to person, place, and time Abdomen - soft, nontender, nondistended, no masses or organomegaly Pelvic - normal external genitalia, vulva, vagina, cervix, uterus and adnexa ,         VAGINA: normal appearing vagina with normal color  and discharge, no lesions,               CERVIX      normal appearing cervix without discharge or lesions, cervical motion tenderness absent, multiparous os ,        UTERUS: uterus is normal size, shape, consistency and nontender, no enlargement ,          ADNEXA: normal adnexa in size, nontender and no masses    Blood pressure 119/83, pulse 66, temperature 98.6 F (37 C), temperature source Oral, resp. rate 16, height 5\' 2"  (1.575 m), weight 68.221 kg (150 lb 6.4 oz), last menstrual period 03/05/2011.  Physical Exam as above  MAU Course  Procedures gc/ chl  And wet prep collected.  MDM   Assessment and Plan  Early pregnancy without problems, EGA [redacted]wk 5 days [facility]  December 10, 2011 Plan 1. Confirm pregnancy, give pt note for Medicaid.         2.  Rx Prenatal vits          3. Rx Zofran  Khallid Pasillas V 04/07/2011, 9:52 PM

## 2011-04-07 NOTE — Progress Notes (Signed)
Pt LMP 03/05/2011, UPT at home was faintly pos.  Pt having nausea and sore breast.  Pt G3 P1.

## 2011-04-08 LAB — GC/CHLAMYDIA PROBE AMP, GENITAL
Chlamydia, DNA Probe: NEGATIVE
GC Probe Amp, Genital: NEGATIVE

## 2011-05-03 LAB — CBC
HCT: 36.8
Hemoglobin: 11.7 — ABNORMAL LOW
MCHC: 31.9
MCV: 75.7 — ABNORMAL LOW
Platelets: 322
RBC: 4.86
RDW: 14.6
WBC: 7.7

## 2011-05-03 LAB — GC/CHLAMYDIA PROBE AMP, GENITAL
Chlamydia, DNA Probe: NEGATIVE
GC Probe Amp, Genital: NEGATIVE

## 2011-05-03 LAB — WET PREP, GENITAL
Clue Cells Wet Prep HPF POC: NONE SEEN
Trich, Wet Prep: NONE SEEN
Yeast Wet Prep HPF POC: NONE SEEN

## 2011-05-03 LAB — POCT PREGNANCY, URINE
Operator id: 27524
Preg Test, Ur: NEGATIVE

## 2011-05-07 LAB — WET PREP, GENITAL
Clue Cells Wet Prep HPF POC: NONE SEEN
Trich, Wet Prep: NONE SEEN
Yeast Wet Prep HPF POC: NONE SEEN

## 2011-05-07 LAB — URINALYSIS, ROUTINE W REFLEX MICROSCOPIC
Bilirubin Urine: NEGATIVE
Glucose, UA: NEGATIVE
Ketones, ur: NEGATIVE
Leukocytes, UA: NEGATIVE
Nitrite: NEGATIVE
Protein, ur: NEGATIVE
Specific Gravity, Urine: 1.015
Urobilinogen, UA: 0.2
pH: 7

## 2011-05-07 LAB — HEMOGLOBIN AND HEMATOCRIT, BLOOD
HCT: 37.4
Hemoglobin: 12.2

## 2011-05-07 LAB — HCG, SERUM, QUALITATIVE: Preg, Serum: NEGATIVE

## 2011-05-07 LAB — POCT PREGNANCY, URINE
Operator id: 12056
Preg Test, Ur: NEGATIVE

## 2011-05-07 LAB — URINE MICROSCOPIC-ADD ON

## 2011-05-07 LAB — GC/CHLAMYDIA PROBE AMP, GENITAL
Chlamydia, DNA Probe: NEGATIVE
GC Probe Amp, Genital: NEGATIVE

## 2011-05-13 LAB — WET PREP, GENITAL
Trich, Wet Prep: NONE SEEN
Yeast Wet Prep HPF POC: NONE SEEN

## 2011-05-13 LAB — URINALYSIS, ROUTINE W REFLEX MICROSCOPIC
Bilirubin Urine: NEGATIVE
Glucose, UA: NEGATIVE
Ketones, ur: NEGATIVE
Nitrite: NEGATIVE
Protein, ur: NEGATIVE
Specific Gravity, Urine: 1.025
Urobilinogen, UA: 1
pH: 6

## 2011-05-13 LAB — CBC
HCT: 40.8
Hemoglobin: 12.9
MCHC: 31.6
MCV: 76.9 — ABNORMAL LOW
Platelets: 325
RBC: 5.31 — ABNORMAL HIGH
RDW: 14.8
WBC: 7

## 2011-05-13 LAB — URINE MICROSCOPIC-ADD ON

## 2011-05-13 LAB — POCT PREGNANCY, URINE: Preg Test, Ur: NEGATIVE

## 2011-05-13 LAB — GC/CHLAMYDIA PROBE AMP, GENITAL
Chlamydia, DNA Probe: NEGATIVE
GC Probe Amp, Genital: NEGATIVE

## 2011-05-21 LAB — CBC
HCT: 28.1 — ABNORMAL LOW
HCT: 34.8 — ABNORMAL LOW
Hemoglobin: 11.3 — ABNORMAL LOW
Hemoglobin: 9.2 — ABNORMAL LOW
MCHC: 32.5
MCHC: 32.8
MCV: 76.7 — ABNORMAL LOW
MCV: 76.7 — ABNORMAL LOW
Platelets: 246
Platelets: 292
RBC: 3.66 — ABNORMAL LOW
RBC: 4.53
RDW: 15.6 — ABNORMAL HIGH
RDW: 16 — ABNORMAL HIGH
WBC: 11.6 — ABNORMAL HIGH
WBC: 17.5 — ABNORMAL HIGH

## 2011-05-21 LAB — RPR: RPR Ser Ql: NONREACTIVE

## 2011-05-26 ENCOUNTER — Inpatient Hospital Stay (INDEPENDENT_AMBULATORY_CARE_PROVIDER_SITE_OTHER)
Admission: RE | Admit: 2011-05-26 | Discharge: 2011-05-26 | Disposition: A | Discharge status: Home / Self Care | Payer: Self-pay | Source: Ambulatory Visit | Attending: Family Medicine | Admitting: Family Medicine

## 2011-05-26 DIAGNOSIS — K089 Disorder of teeth and supporting structures, unspecified: Secondary | ICD-10-CM

## 2011-05-26 LAB — POCT PREGNANCY, URINE: Preg Test, Ur: NEGATIVE

## 2011-05-30 LAB — URINALYSIS, ROUTINE W REFLEX MICROSCOPIC
Glucose, UA: NEGATIVE
Hgb urine dipstick: NEGATIVE
Ketones, ur: >80 — AB
Nitrite: NEGATIVE
Protein, ur: 30 — AB
Specific Gravity, Urine: >1.03 — ABNORMAL HIGH
Urobilinogen, UA: 1
pH: 6

## 2011-05-30 LAB — URINE MICROSCOPIC-ADD ON

## 2011-06-07 ENCOUNTER — Inpatient Hospital Stay (HOSPITAL_COMMUNITY): Payer: Self-pay

## 2011-06-07 ENCOUNTER — Inpatient Hospital Stay (HOSPITAL_COMMUNITY)
Admission: AD | Admit: 2011-06-07 | Discharge: 2011-06-07 | Disposition: A | Discharge status: Home / Self Care | Payer: Self-pay | Source: Ambulatory Visit | Attending: Obstetrics & Gynecology | Admitting: Obstetrics & Gynecology

## 2011-06-07 ENCOUNTER — Encounter (HOSPITAL_COMMUNITY): Payer: Self-pay

## 2011-06-07 DIAGNOSIS — Z331 Pregnant state, incidental: Secondary | ICD-10-CM

## 2011-06-07 DIAGNOSIS — Z3202 Encounter for pregnancy test, result negative: Secondary | ICD-10-CM | POA: Insufficient documentation

## 2011-06-07 DIAGNOSIS — O26899 Other specified pregnancy related conditions, unspecified trimester: Secondary | ICD-10-CM

## 2011-06-07 DIAGNOSIS — R109 Unspecified abdominal pain: Secondary | ICD-10-CM

## 2011-06-07 DIAGNOSIS — R300 Dysuria: Secondary | ICD-10-CM | POA: Insufficient documentation

## 2011-06-07 DIAGNOSIS — B9689 Other specified bacterial agents as the cause of diseases classified elsewhere: Secondary | ICD-10-CM

## 2011-06-07 DIAGNOSIS — A499 Bacterial infection, unspecified: Secondary | ICD-10-CM

## 2011-06-07 DIAGNOSIS — N760 Acute vaginitis: Secondary | ICD-10-CM

## 2011-06-07 LAB — URINALYSIS, ROUTINE W REFLEX MICROSCOPIC
Bilirubin Urine: NEGATIVE
Glucose, UA: NEGATIVE mg/dL
Hgb urine dipstick: NEGATIVE
Ketones, ur: NEGATIVE mg/dL
Nitrite: NEGATIVE
Protein, ur: NEGATIVE mg/dL
Specific Gravity, Urine: <1.005 — ABNORMAL LOW (ref 1.005–1.030)
Urobilinogen, UA: 0.2 mg/dL (ref 0.0–1.0)
pH: 5.5 (ref 5.0–8.0)

## 2011-06-07 LAB — URINE MICROSCOPIC-ADD ON

## 2011-06-07 LAB — HCG, QUANTITATIVE, PREGNANCY: hCG, Beta Chain, Quant, S: 191 m[IU]/mL — ABNORMAL HIGH (ref <5)

## 2011-06-07 LAB — POCT PREGNANCY, URINE: Preg Test, Ur: POSITIVE

## 2011-06-07 LAB — WET PREP, GENITAL
Trich, Wet Prep: NONE SEEN
Yeast Wet Prep HPF POC: NONE SEEN

## 2011-06-07 MED ORDER — METRONIDAZOLE 500 MG PO TABS: 500.0000 mg | ORAL_TABLET | Freq: Two times a day (BID) | ORAL | Status: AC

## 2011-06-07 NOTE — Progress Notes (Signed)
Had abortion Sept. 12th, positive pregnancy test today, negative two weeks ago at Urgent Care, urinary frequency

## 2011-06-07 NOTE — ED Provider Notes (Signed)
History     Chief Complaint  Patient presents with  . Dysuria  . Possible Pregnancy   The history is provided by the patient.    Pt is s/p TAB 9/12 and had a negative UPT 1 1/2 weeks ago with unprotected sex 9/21 and 9/22, 10/12.  She has had frequency of urination for 1 1/2 weeks and had a negative UPT at [facility].  She was seen at that time for a tooth ache and was given lidocaine.  She takes Aleve.  She also has a vaginal discharge with an odor.  She used Monistat 1 without relief of symptoms.  She has not had any itching or burning.  She denies abdominal pain or cramping.  She had RCM prior to her TAB.  Past Medical History  Diagnosis Date  . Asthma     Past Surgical History  Procedure Date  . No past surgeries     No family history on file.  History  Substance Use Topics  . Smoking status: Never Smoker   . Smokeless tobacco: Not on file  . Alcohol Use: Yes     occasional alcohol    Allergies:  Allergies  Allergen Reactions  . Latex Itching    Burning   . Tylenol (Acetaminophen) Hives    Prescriptions prior to admission  Medication Sig Dispense Refill  . cephALEXin (KEFLEX) 500 MG capsule Take 500 mg by mouth 2 (two) times daily.        . MetroNIDAZOLE (FLAGYL PO) Take 2 tablets by mouth 2 (two) times daily. For seven days       . OVER THE COUNTER MEDICATION Place 1 suppository vaginally once. Used one time for bacterial infection      . Prenatal Vit-Fe Fumarate-FA (PRENATAL MULTIVITAMIN) 60-1 MG tablet Take 1 tablet by mouth daily.  30 tablet  6  . Sulfamethoxazole-Trimethoprim (BACTRIM PO) Take 1 tablet by mouth 2 (two) times daily.          Review of Systems  Constitutional: Negative for fever and chills.  Respiratory: Negative for cough.   Gastrointestinal: Negative for nausea, vomiting and abdominal pain.  Genitourinary: Positive for frequency. Negative for dysuria and urgency.   Physical Exam   Blood pressure 125/80, pulse 72, temperature  97.1 F (36.2 C), temperature source Oral, resp. rate 16, height 5\' 1"  (1.549 m), weight 154 lb (69.854 kg), last menstrual period 03/05/2011, unknown if currently breastfeeding.  Physical Exam  Constitutional: She is oriented to person, place, and time. She appears well-developed and well-nourished.  HENT:  Head: Normocephalic.  Eyes: Pupils are equal, round, and reactive to light.  Neck: Normal range of motion. Neck supple.  Cardiovascular: Normal rate.   Respiratory: Effort normal.  GI: Soft. She exhibits no distension and no mass. There is no tenderness. There is no rebound and no guarding.  Genitourinary:       Small amount of foul smelling frothy vaginal discharge in vault; cervix clean NT; uterus NSSC NT adnexal without palpable enlargement - slightly tender right adnexa without rebound or guarding  Musculoskeletal: Normal range of motion.  Neurological: She is alert and oriented to person, place, and time.  Skin: Skin is warm and dry.  Psychiatric: She has a normal mood and affect.    MAU Course  Procedures UPT-positive HCG-191 Wet prep- mod amount of clue cells; few WBCs GC/chlamydia- (neg/neg) U/A- neg except for small leukocytes Ultrasound: no GS/YS/FP/embryo; area of focal thickening in fundus(1.5x.9cm)suspicious for retained products.  Small  right CLC   Assessment and Plan  Pregnancy s/pTAB 9/12 Ectopic precautions given BV- Flagyl 500mg  BID for 7 days F/u 10/28 for repeat HCG - sooner if increase in pain or bleeding  LINEBERRY,SUSAN 06/07/2011, 1:18 PM

## 2011-06-08 LAB — GC/CHLAMYDIA PROBE AMP, GENITAL
Chlamydia, DNA Probe: NEGATIVE
GC Probe Amp, Genital: NEGATIVE

## 2011-06-09 ENCOUNTER — Inpatient Hospital Stay (HOSPITAL_COMMUNITY)
Admission: AD | Admit: 2011-06-09 | Discharge: 2011-06-09 | Disposition: A | Discharge status: Home / Self Care | Payer: Self-pay | Source: Ambulatory Visit | Attending: Obstetrics & Gynecology | Admitting: Obstetrics & Gynecology

## 2011-06-09 DIAGNOSIS — O99891 Other specified diseases and conditions complicating pregnancy: Secondary | ICD-10-CM | POA: Insufficient documentation

## 2011-06-09 DIAGNOSIS — Z3480 Encounter for supervision of other normal pregnancy, unspecified trimester: Secondary | ICD-10-CM

## 2011-06-09 LAB — HCG, QUANTITATIVE, PREGNANCY: hCG, Beta Chain, Quant, S: 538 m[IU]/mL — ABNORMAL HIGH (ref <5)

## 2011-06-09 NOTE — ED Provider Notes (Signed)
Pt not here for pain or bleeding.  Agree with above note.  Shelly Gibbs H. 06/09/2011 8:34 PM

## 2011-06-09 NOTE — ED Provider Notes (Signed)
History   Chief Complaint:  Labs Only   Shelly Gibbs is  25 y.o. [id number] Patient's last menstrual period was 03/05/2011.Marland Kitchen Patient is here for follow up of quantitative HCG and ongoing surveillance of pregnancy status.   She had an elective termination in April 24, 2011, neg UPT at Urgent Care 2 weeks ago followed by pos UPT 06/07/11  She came to MAU 2 days ago for pos UPT and reported unprotected IC late September and October 12.    Since her last visit, the patient is without new complaint.   The patient reports bleeding as none now.   General ROS:  negative  Her previous Quantitative HCG values are: HCG, QUANTITATIVE, PREGNANCY   Collection Time   06/07/11  1:01 PM      Component Value Range   hCG, Beta Chain, Quant, S 191 (*) <5 (mIU/mL)   Results for orders placed during the hospital encounter of 06/09/11 (from the past 24 hour(s))  HCG, QUANTITATIVE, PREGNANCY     Status: Abnormal   Collection Time   06/09/11 11:22 AM      Component Value Range   hCG, Beta Chain, Quant, S 538 (*) <5 (mIU/mL)    Physical Exam   Blood pressure 120/79, pulse 76, temperature 98.6 F (37 C), temperature source Oral, resp. rate 18, height 5\' 2"  (1.575 m), weight 69.4 kg (153 lb), last menstrual period 03/05/2011.  Focused Gynecological Exam: examination not indicated  Assessment: 1. Early pregnancy, appropriately rising quants  Plan: 1. Start PNC 2. F/U in MAU for dating [place] in 2 weeks  Keitha Kolk 06/09/2011, 11:03 AM

## 2011-06-09 NOTE — ED Notes (Signed)
V.Smith,CNM  Discussed lab results and  F/u POC. P t verbalized understanding.

## 2011-06-09 NOTE — Progress Notes (Signed)
Pt was told to come back today for f/u labs.Had TAB in Sept. Hormone level still elevated. Unsure if this is new pregnancy.

## 2011-06-20 ENCOUNTER — Inpatient Hospital Stay (HOSPITAL_COMMUNITY): Payer: Self-pay

## 2011-06-20 ENCOUNTER — Encounter (HOSPITAL_COMMUNITY): Payer: Self-pay

## 2011-06-20 ENCOUNTER — Inpatient Hospital Stay (HOSPITAL_COMMUNITY)
Admission: AD | Admit: 2011-06-20 | Discharge: 2011-06-20 | Disposition: A | Discharge status: Home / Self Care | Payer: Self-pay | Source: Ambulatory Visit | Attending: Obstetrics & Gynecology | Admitting: Obstetrics & Gynecology

## 2011-06-20 DIAGNOSIS — R109 Unspecified abdominal pain: Secondary | ICD-10-CM | POA: Insufficient documentation

## 2011-06-20 DIAGNOSIS — O99891 Other specified diseases and conditions complicating pregnancy: Secondary | ICD-10-CM | POA: Insufficient documentation

## 2011-06-20 DIAGNOSIS — O26899 Other specified pregnancy related conditions, unspecified trimester: Secondary | ICD-10-CM

## 2011-06-20 DIAGNOSIS — K1370 Unspecified lesions of oral mucosa: Secondary | ICD-10-CM | POA: Insufficient documentation

## 2011-06-20 DIAGNOSIS — K029 Dental caries, unspecified: Secondary | ICD-10-CM

## 2011-06-20 HISTORY — DX: Chlamydial infection, unspecified: A74.9

## 2011-06-20 LAB — URINALYSIS, ROUTINE W REFLEX MICROSCOPIC
Bilirubin Urine: NEGATIVE
Glucose, UA: NEGATIVE mg/dL
Ketones, ur: NEGATIVE mg/dL
Nitrite: NEGATIVE
Protein, ur: NEGATIVE mg/dL
Specific Gravity, Urine: >1.03 — ABNORMAL HIGH (ref 1.005–1.030)
Urobilinogen, UA: 2 mg/dL — ABNORMAL HIGH (ref 0.0–1.0)
pH: 6 (ref 5.0–8.0)

## 2011-06-20 LAB — URINE MICROSCOPIC-ADD ON

## 2011-06-20 MED ORDER — ONDANSETRON 4 MG PO TBDP
4.0000 mg | ORAL_TABLET | Freq: Once | ORAL | Status: AC
  Administered 2011-06-20: 4 mg via ORAL
  Filled 2011-06-20: qty 1

## 2011-06-20 NOTE — Progress Notes (Signed)
Patient states that she terminated a pregnancy on 9-12. Did not have a period and found out she was pregnant in October. Now having a foul smelling brown discharge and lower abdominal pain. States the left side of her face and mouth started hurting when she found out about the pregnancy and she is unable to sleep due to the pain. Patient wants to have an ultrasound to find out how far she is because she plans to terminate this pregnancy.

## 2011-06-20 NOTE — ED Provider Notes (Signed)
Agree with above note.  Shelly Gibbs H. 06/20/2011 4:03 PM

## 2011-06-20 NOTE — ED Provider Notes (Signed)
History   Pt presents today c/o lower abd pain and mouth pain. She states the pain has increased over the past several days. She is planning to terminate the preg. She also states she has been having left sided mouth and jaw pain that began when she discovered she was preg. She reports some spotting yesterday but none today. She denies fever or any other sx at this time.  Chief Complaint  Patient presents with  . Vaginal Discharge   HPI  OB History    Grav Para Term Preterm Abortions TAB SAB Ect Mult Living   6 1 1  0 3 2 1  0 0 1      Past Medical History  Diagnosis Date  . Asthma   . Chlamydia     Past Surgical History  Procedure Date  . No past surgeries   . Induced abortion     x 2    Family History  Problem Relation Age of Onset  . Diabetes Maternal Aunt     History  Substance Use Topics  . Smoking status: Never Smoker   . Smokeless tobacco: Not on file  . Alcohol Use: Yes     occasional alcohol    Allergies:  Allergies  Allergen Reactions  . Latex Itching    Burning   . Tylenol (Acetaminophen) Hives    Prescriptions prior to admission  Medication Sig Dispense Refill  . metroNIDAZOLE (FLAGYL) 500 MG tablet Take 500 mg by mouth every 12 (twelve) hours. Pt started 06/09/11 ended 06/16/11. To take for 7 days       . prenatal vitamin w/FE, FA (PRENATAL 1 + 1) 27-1 MG TABS Take 1 tablet by mouth daily. Pt has not started yet       . DISCONTD: Prenatal Vit-Fe Fumarate-FA (PRENATAL MULTIVITAMIN) 60-1 MG tablet Take 1 tablet by mouth daily.  30 tablet  6    Review of Systems  Constitutional: Negative for fever.  Cardiovascular: Negative for chest pain.  Gastrointestinal: Positive for nausea and abdominal pain. Negative for vomiting, diarrhea and constipation.  Genitourinary: Negative for dysuria, urgency, frequency and hematuria.  Neurological: Negative for dizziness and headaches.  Psychiatric/Behavioral: Negative for depression and suicidal ideas.    Physical Exam   Blood pressure 114/80, pulse 93, temperature 99.3 F (37.4 C), resp. rate 16, height 5' 2.5" (1.588 m), weight 149 lb (67.586 kg), SpO2 99.00%.  Physical Exam  Nursing note and vitals reviewed. Constitutional: She is oriented to person, place, and time. She appears well-developed and well-nourished. No distress.  HENT:  Head: Normocephalic and atraumatic.  Mouth/Throat: No oral lesions. Dental caries present. No dental abscesses or uvula swelling.    Eyes: EOM are normal. Pupils are equal, round, and reactive to light.  GI: Soft. She exhibits no distension. There is no tenderness. There is no rebound and no guarding.  Neurological: She is alert and oriented to person, place, and time.  Skin: Skin is warm and dry. She is not diaphoretic.  Psychiatric: She has a normal mood and affect. Her behavior is normal. Thought content normal.    MAU Course  Procedures  Results for orders placed during the hospital encounter of 06/20/11 (from the past 24 hour(s))  URINALYSIS, ROUTINE W REFLEX MICROSCOPIC     Status: Abnormal   Collection Time   06/20/11  9:26 AM      Component Value Range   Color, Urine YELLOW  YELLOW    Appearance CLEAR  CLEAR    Specific Gravity,  Urine >1.030 (*) 1.005 - 1.030    pH 6.0  5.0 - 8.0    Glucose, UA NEGATIVE  NEGATIVE (mg/dL)   Hgb urine dipstick SMALL (*) NEGATIVE    Bilirubin Urine NEGATIVE  NEGATIVE    Ketones, ur NEGATIVE  NEGATIVE (mg/dL)   Protein, ur NEGATIVE  NEGATIVE (mg/dL)   Urobilinogen, UA 2.0 (*) 0.0 - 1.0 (mg/dL)   Nitrite NEGATIVE  NEGATIVE    Leukocytes, UA TRACE (*) NEGATIVE   URINE MICROSCOPIC-ADD ON     Status: Abnormal   Collection Time   06/20/11  9:26 AM      Component Value Range   Squamous Epithelial / LPF MANY (*) RARE    WBC, UA 3-6  <3 (WBC/hpf)   RBC / HPF 0-2  <3 (RBC/hpf)   Bacteria, UA RARE  RARE    Urine-Other MUCOUS PRESENT     US shows single living IUP at 5.6wks with an EDC of 02/14/12.  Urine  sent for culture.  Assessment and Plan  Abd pain in preg: discussed with pt at length. She has a living IUP. Discussed diet, activity, risks, and precautions.  Mouth pain: advised pt to f/u with dentist ASAP. Discussed diet, activity, risks, and precautions.  Clinton Gallant. Ayyub Krall III, DrHSc, MPAS, PA-C   Shelly Gibbs 06/20/2011, 10:33 AM   Shelly Hoover, PA 06/20/11 1109

## 2011-06-21 ENCOUNTER — Telehealth (HOSPITAL_COMMUNITY): Payer: Self-pay | Admitting: *Deleted

## 2011-06-24 ENCOUNTER — Ambulatory Visit (HOSPITAL_COMMUNITY): Payer: Self-pay

## 2011-11-01 ENCOUNTER — Inpatient Hospital Stay (HOSPITAL_COMMUNITY)
Admission: AD | Admit: 2011-11-01 | Discharge: 2011-11-01 | Disposition: A | Discharge status: Home / Self Care | Payer: Self-pay | Source: Ambulatory Visit | Attending: Obstetrics and Gynecology | Admitting: Obstetrics and Gynecology

## 2011-11-01 ENCOUNTER — Encounter (HOSPITAL_COMMUNITY): Payer: Self-pay

## 2011-11-01 DIAGNOSIS — O210 Mild hyperemesis gravidarum: Secondary | ICD-10-CM | POA: Insufficient documentation

## 2011-11-01 DIAGNOSIS — N926 Irregular menstruation, unspecified: Secondary | ICD-10-CM

## 2011-11-01 DIAGNOSIS — O219 Vomiting of pregnancy, unspecified: Secondary | ICD-10-CM

## 2011-11-01 LAB — POCT PREGNANCY, URINE: Preg Test, Ur: POSITIVE — AB

## 2011-11-01 MED ORDER — PROMETHAZINE HCL 12.5 MG PO TABS
12.5000 mg | ORAL_TABLET | Freq: Four times a day (QID) | ORAL | Status: DC | PRN
Start: 2011-11-01 — End: 2011-12-30

## 2011-11-01 NOTE — MAU Provider Note (Signed)
  History     CSN: [id number]  Arrival date and time: 11/01/11 1147   First Provider Initiated Contact with Patient 11/01/11 1228      Chief Complaint  Patient presents with  . Nausea   HPI Shelly Gibbs is 26 y.o. [id number] [redacted]w[redacted]d weeks presenting with missed period.  Took a home pregnancy test that was faintly positive.  Denies vaginal bleeding or discharge or abdominal pain.  LMP 2/16 ?  Spotting at first and flow on the 19th.  Not using contraception.  Unplanned but wanted pregnancy.  Plans care at [facility]. They delivered her last child in 2008.   Needs confirmation letter.  Mild nausea and without vomiting.    Past Medical History  Diagnosis Date  . Asthma   . Chlamydia     Past Surgical History  Procedure Date  . Induced abortion     x 2  . Dilation and curettage of uterus     Family History  Problem Relation Age of Onset  . Diabetes Maternal Aunt   . Anesthesia problems Neg Hx     History  Substance Use Topics  . Smoking status: Never Smoker   . Smokeless tobacco: Never Used  . Alcohol Use: Yes     occasional alcohol    Allergies:  Allergies  Allergen Reactions  . Latex Itching    Burning   . Tylenol (Acetaminophen) Hives    No prescriptions prior to admission    Review of Systems  Constitutional: Negative.   Gastrointestinal: Positive for nausea. Negative for vomiting and abdominal pain.  Genitourinary:       Negative for vaginal bleeding and discharge   Physical Exam   Blood pressure 124/91, pulse 94, temperature 98.2 F (36.8 C), temperature source Oral, resp. rate 16, height 5\' 2"  (1.575 m), weight 68.765 kg (151 lb 9.6 oz), last menstrual period 08/28/2011, unknown if currently breastfeeding.  Physical Exam  Constitutional: She is oriented to person, place, and time. She appears well-developed and well-nourished. No distress.  HENT:  Head: Normocephalic.  Neck: Normal range of motion.  Cardiovascular: Normal rate.     Respiratory: Effort normal.  Neurological: She is alert and oriented to person, place, and time.  Skin: Skin is warm and dry.  Psychiatric: She has a normal mood and affect. Her behavior is normal. Judgment and thought content normal.     Results for orders placed during the hospital encounter of 11/01/11 (from the past 24 hour(s))  POCT PREGNANCY, URINE     Status: Abnormal   Collection Time   11/01/11 12:26 PM      Component Value Range   Preg Test, Ur POSITIVE (*) NEGATIVE    MAU Course  Procedures  MDM  Assessment and Plan  A:  Missed period      + UPT       Mild nausea in pregnancy  P:  Keep plans for prenatal care with [facility]      Prenatal vitamins, OTC, daily     Rx for Phenergan 12.5mg      Verification letter as requested by the patient   Abdirizak Richison,EVE M 11/01/2011, 12:30 PM

## 2011-11-01 NOTE — MAU Note (Signed)
Pt states LMP-09/28/2011 estimated. Had +home upt over 1 week ago. Denies vaginal d/c changes. Denies pain at present.

## 2011-11-01 NOTE — MAU Note (Signed)
Patient reports having a period in January between 1/16 and 1/19, had brown bleeding in February, had positive pregnancy test at home light lines, nausea.

## 2011-11-01 NOTE — Discharge Instructions (Signed)
Morning Sickness Morning sickness is when you feel sick to your stomach (nauseous) during pregnancy. You may feel sick to your stomach and throw up (vomit). You may feel sick in the morning, but you can feel this way any time of day. Some women feel very sick to their stomach and cannot stop throwing up (hyperemesis gravidarum). HOME CARE  Take multivitamins as told by your doctor. Taking multivitamins before getting pregnant can stop or lessen the harshness of morning sickness.   Eat dry toast or unsalted crackers before getting out of bed.   Eat 5 to 6 small meals a day.   Eat dry and bland foods like rice and baked potatoes.   Do not drink liquids with meals. Drink between meals.   Do not eat greasy, fatty, or spicy foods.   Have someone cook for you if the smell of food causes you to feel sick or throw up.   Do not take vitamins with iron, or as told by your doctor.   Eat protein when you need a snack (nuts, yogurt, cheese).   Eat unsweetened gelatins for dessert.   Wear a bracelet used for sea sickness (acupressure wristband).   Go to a doctor that puts thin needles into certain body points (acupuncture) to improve how you feel.   Do not smoke.   Use a humidifier to keep the air in your house free of odors.  GET HELP RIGHT AWAY IF:   You feel very sick to your stomach and cannot stop throwing up.   You pass out (faint).   You have a fever.   You need medicine to feel better.   You feel dizzy or lightheaded.   You are losing weight.   You need help knowing what to eat and what not to eat.  MAKE SURE YOU:   Understand these instructions.   Will watch your condition.   Will get help right away if you are not doing well or get worse.  Document Released: 09/05/2004 Document Revised: 07/18/2011 Document Reviewed: 10/26/2009 [facility] Patient Information 2012 [place], [place].ABCs of Pregnancy A Antepartum care is very important. Be sure you see your doctor and get  prenatal care as soon as you think you are pregnant. At this time, you will be tested for infection, genetic abnormalities and potential problems with you and the pregnancy. This is the time to discuss diet, exercise, work, medications, labor, pain medication during labor and the possibility of a cesarean delivery. Ask any questions that may concern you. It is important to see your doctor regularly throughout your pregnancy. Avoid exposure to toxic substances and chemicals - such as cleaning solvents, lead and mercury, some insecticides, and paint. Pregnant women should avoid exposure to paint fumes, and fumes that cause you to feel ill, dizzy or faint. When possible, it is a good idea to have a pre-pregnancy consultation with your caregiver to begin some important recommendations your caregiver suggests such as, taking folic acid, exercising, quitting smoking, avoiding alcoholic beverages, etc. B Breastfeeding is the healthiest choice for both you and your baby. It has many nutritional benefits for the baby and health benefits for the mother. It also creates a very tight and loving bond between the baby and mother. Talk to your doctor, your family and friends, and your employer about how you choose to feed your baby and how they can support you in your decision. Not all birth defects can be prevented, but a woman can take actions that may increase her  chance of having a healthy baby. Many birth defects happen very early in pregnancy, sometimes before a woman even knows she is pregnant. Birth defects or abnormalities of any child in your or the father's family should be discussed with your caregiver. Get a good support bra as your breast size changes. Wear it especially when you exercise and when nursing.  C Celebrate the news of your pregnancy with the your spouse/father and family. Childbirth classes are helpful to take for you and the spouse/father because it helps to understand what happens during the  pregnancy, labor and delivery. Cesarean delivery should be discussed with your doctor so you are prepared for that possibility. The pros and cons of circumcision if it is a boy, should be discussed with your pediatrician. Cigarette smoking during pregnancy can result in low birth weight babies. It has been associated with infertility, miscarriages, tubal pregnancies, infant death (mortality) and poor health (morbidity) in childhood. Additionally, cigarette smoking may cause long-term learning disabilities. If you smoke, you should try to quit before getting pregnant and not smoke during the pregnancy. Secondary smoke may also harm a mother and her developing baby. It is a good idea to ask people to stop smoking around you during your pregnancy and after the baby is born. Extra calcium is necessary when you are pregnant and is found in your prenatal vitamin, in dairy products, green leafy vegetables and in calcium supplements. D A healthy diet according to your current weight and height, along with vitamins and mineral supplements should be discussed with your caregiver. Domestic abuse or violence should be made known to your doctor right away to get the situation corrected. Drink more water when you exercise to keep hydrated. Discomfort of your back and legs usually develops and progresses from the middle of the second trimester through to delivery of the baby. This is because of the enlarging baby and uterus, which may also affect your balance. Do not take illegal drugs. Illegal drugs can seriously harm the baby and you. Drink extra fluids (water is best) throughout pregnancy to help your body keep up with the increases in your blood volume. Drink at least 6 to 8 glasses of water, fruit juice, or milk each day. A good way to know you are drinking enough fluid is when your urine looks almost like clear water or is very light yellow.  E Eat healthy to get the nutrients you and your unborn baby need. Your meals  should include the five basic food groups. Exercise (30 minutes of light to moderate exercise a day) is important and encouraged during pregnancy, if there are no medical problems or problems with the pregnancy. Exercise that causes discomfort or dizziness should be stopped and reported to your caregiver. Emotions during pregnancy can change from being ecstatic to depression and should be understood by you, your partner and your family. F Fetal screening with ultrasound, amniocentesis and monitoring during pregnancy and labor is common and sometimes necessary. Take 400 micrograms of folic acid daily both before, when possible, and during the first few months of pregnancy to reduce the risk of birth defects of the brain and spine. All women who could possibly become pregnant should take a vitamin with folic acid, every day. It is also important to eat a healthy diet with fortified foods (enriched grain products, including cereals, rice, breads, and pastas) and foods with natural sources of folate (orange juice, green leafy vegetables, beans, peanuts, broccoli, asparagus, peas, and lentils). The father should be  involved with all aspects of the pregnancy including, the prenatal care, childbirth classes, labor, delivery, and postpartum time. Fathers may also have emotional concerns about being a father, financial needs, and raising a family. G Genetic testing should be done appropriately. It is important to know your family and the father's history. If there have been problems with pregnancies or birth defects in your family, report these to your doctor. Also, genetic counselors can talk with you about the information you might need in making decisions about having a family. You can call a major medical center in your area for help in finding a board-certified genetic counselor. Genetic testing and counseling should be done before pregnancy when possible, especially if there is a history of problems in the mother's  or father's family. Certain ethnic backgrounds are more at risk for genetic defects. H Get familiar with the hospital where you will be having your baby. Get to know how long it takes to get there, the labor and delivery area, and the hospital procedures. Be sure your medical insurance is accepted there. Get your home ready for the baby including, clothes, the baby's room (when possible), furniture and car seat. Hand washing is important throughout the day, especially after handling raw meat and poultry, changing the baby's diaper or using the bathroom. This can help prevent the spread of many bacteria and viruses that cause infection. Your hair may become dry and thinner, but will return to normal a few weeks after the baby is born. Heartburn is a common problem that can be treated by taking antacids recommended by your caregiver, eating smaller meals 5 or 6 times a day, not drinking liquids when eating, drinking between meals and raising the head of your bed 2 to 3 inches. I Insurance to cover you, the baby, doctor and hospital should be reviewed so that you will be prepared to pay any costs not covered by your insurance plan. If you do not have medical insurance, there are usually clinics and services available for you in your community. Take 30 milligrams of iron during your pregnancy as prescribed by your doctor to reduce the risk of low red blood cells (anemia) later in pregnancy. All women of childbearing age should eat a diet rich in iron. J There should be a joint effort for the mother, father and any other children to adapt to the pregnancy financially, emotionally, and psychologically during the pregnancy. Join a support group for moms-to-be. Or, join a class on parenting or childbirth. Have the family participate when possible. K Know your limits. Let your caregiver know if you experience any of the following:   Pain of any kind.   Strong cramps.   You develop a lot of weight in a short  period of time (5 pounds in 3 to 5 days).   Vaginal bleeding, leaking of amniotic fluid.   Headache, vision problems.   Dizziness, fainting, shortness of breath.   Chest pain.   Fever of 102 F (38.9 C) or higher.   Gush of clear fluid from your vagina.   Painful urination.   Domestic violence.   Irregular heartbeat (palpitations).   Rapid beating of the heart (tachycardia).   Constant feeling sick to your stomach (nauseous) and vomiting.   Trouble walking, fluid retention (edema).   Muscle weakness.   If your baby has decreased activity.   Persistent diarrhea.   Abnormal vaginal discharge.   Uterine contractions at 20-minute intervals.   Back pain that travels down your  leg.  L Learn and practice that what you eat and drink should be in moderation and healthy for you and your baby. Legal drugs such as alcohol and caffeine are important issues for pregnant women. There is no safe amount of alcohol a woman can drink while pregnant. Fetal alcohol syndrome, a disorder characterized by growth retardation, facial abnormalities, and central nervous system dysfunction, is caused by a woman's use of alcohol during pregnancy. Caffeine, found in tea, coffee, soft drinks and chocolate, should also be limited. Be sure to read labels when trying to cut down on caffeine during pregnancy. More than 200 foods, beverages, and over-the-counter medications contain caffeine and have a high salt content! There are coffees and teas that do not contain caffeine. M Medical conditions such as diabetes, epilepsy, and high blood pressure should be treated and kept under control before pregnancy when possible, but especially during pregnancy. Ask your caregiver about any medications that may need to be changed or adjusted during pregnancy. If you are currently taking any medications, ask your caregiver if it is safe to take them while you are pregnant or before getting pregnant when possible. Also, be  sure to discuss any herbs or vitamins you are taking. They are medicines, too! Discuss with your doctor all medications, prescribed and over-the-counter, that you are taking. During your prenatal visit, discuss the medications your doctor may give you during labor and delivery. N Never be afraid to ask your doctor or caregiver questions about your health, the progress of the pregnancy, family problems, stressful situations, and recommendation for a pediatrician, if you do not have one. It is better to take all precautions and discuss any questions or concerns you may have during your office visits. It is a good idea to write down your questions before you visit the doctor. O Over-the-counter cough and cold remedies may contain alcohol or other ingredients that should be avoided during pregnancy. Ask your caregiver about prescription, herbs or over-the-counter medications that you are taking or may consider taking while pregnant.  P Physical activity during pregnancy can benefit both you and your baby by lessening discomfort and fatigue, providing a sense of well-being, and increasing the likelihood of early recovery after delivery. Light to moderate exercise during pregnancy strengthens the belly (abdominal) and back muscles. This helps improve posture. Practicing yoga, walking, swimming, and cycling on a stationary bicycle are usually safe exercises for pregnant women. Avoid scuba diving, exercise at high altitudes (over 3000 feet), skiing, horseback riding, contact sports, etc. Always check with your doctor before beginning any kind of exercise, especially during pregnancy and especially if you did not exercise before getting pregnant. Q Queasiness, stomach upset and morning sickness are common during pregnancy. Eating a couple of crackers or dry toast before getting out of bed. Foods that you normally love may make you feel sick to your stomach. You may need to substitute other nutritious foods. Eating 5  or 6 small meals a day instead of 3 large ones may make you feel better. Do not drink with your meals, drink between meals. Questions that you have should be written down and asked during your prenatal visits. R Read about and make plans to baby-proof your home. There are important tips for making your home a safer environment for your baby. Review the tips and make your home safer for you and your baby. Read food labels regarding calories, salt and fat content in the food. S Saunas, hot tubs, and steam rooms should be  avoided while you are pregnant. Excessive high heat may be harmful during your pregnancy. Your caregiver will screen and examine you for sexually transmitted diseases and genetic disorders during your prenatal visits. Learn the signs of labor. Sexual relations while pregnant is safe unless there is a medical or pregnancy problem and your caregiver advises against it. T Traveling long distances should be avoided especially in the third trimester of your pregnancy. If you do have to travel out of state, be sure to take a copy of your medical records and medical insurance plan with you. You should not travel long distances without seeing your doctor first. Most airlines will not allow you to travel after 36 weeks of pregnancy. Toxoplasmosis is an infection caused by a parasite that can seriously harm an unborn baby. Avoid eating undercooked meat and handling cat litter. Be sure to wear gloves when gardening. Tingling of the hands and fingers is not unusual and is due to fluid retention. This will go away after the baby is born. U Womb (uterus) size increases during the first trimester. Your kidneys will begin to function more efficiently. This may cause you to feel the need to urinate more often. You may also leak urine when sneezing, coughing or laughing. This is due to the growing uterus pressing against your bladder, which lies directly in front of and slightly under the uterus during the  first few months of pregnancy. If you experience burning along with frequency of urination or bloody urine, be sure to tell your doctor. The size of your uterus in the third trimester may cause a problem with your balance. It is advisable to maintain good posture and avoid wearing high heels during this time. An ultrasound of your baby may be necessary during your pregnancy and is safe for you and your baby. V Vaccinations are an important concern for pregnant women. Get needed vaccines before pregnancy. Center for Disease Control ([employer]) has clear guidelines for the use of vaccines during pregnancy. Review the list, be sure to discuss it with your doctor. Prenatal vitamins are helpful and healthy for you and the baby. Do not take extra vitamins except what is recommended. Taking too much of certain vitamins can cause overdose problems. Continuous vomiting should be reported to your caregiver. Varicose veins may appear especially if there is a family history of varicose veins. They should subside after the delivery of the baby. Support hose helps if there is leg discomfort. W Being overweight or underweight during pregnancy may cause problems. Try to get within 15 pounds of your ideal weight before pregnancy. Remember, pregnancy is not a time to be dieting! Do not stop eating or start skipping meals as your weight increases. Both you and your baby need the calories and nutrition you receive from a healthy diet. Be sure to consult with your doctor about your diet. There is a formula and diet plan available depending on whether you are overweight or underweight. Your caregiver or nutritionist can help and advise you if necessary. X Avoid X-rays. If you must have dental work or diagnostic tests, tell your dentist or physician that you are pregnant so that extra care can be taken. X-rays should only be taken when the risks of not taking them outweigh the risk of taking them. If needed, only the minimum  amount of radiation should be used. When X-rays are necessary, protective lead shields should be used to cover areas of the body that are not being X-rayed. Y Your baby loves  you. Breastfeeding your baby creates a loving and very close bond between the two of you. Give your baby a healthy environment to live in while you are pregnant. Infants and children require constant care and guidance. Their health and safety should be carefully watched at all times. After the baby is born, rest or take a nap when the baby is sleeping. Z Get your ZZZs. Be sure to get plenty of rest. Resting on your side as often as possible, especially on your left side is advised. It provides the best circulation to your baby and helps reduce swelling. Try taking a nap for 30 to 45 minutes in the afternoon when possible. After the baby is born rest or take a nap when the baby is sleeping. Try elevating your feet for that amount of time when possible. It helps the circulation in your legs and helps reduce swelling.  Most information courtesy of the CDC. Document Released: 07/29/2005 Document Revised: 07/18/2011 Document Reviewed: 04/12/2009 [facility] Patient Information 2012 [place], [place].

## 2011-11-02 NOTE — MAU Provider Note (Signed)
Agree with above note.  Shelly Gibbs 11/02/2011 7:10 AM

## 2011-11-18 ENCOUNTER — Inpatient Hospital Stay (HOSPITAL_COMMUNITY)
Admission: AD | Admit: 2011-11-18 | Discharge: 2011-11-18 | Disposition: A | Discharge status: Home / Self Care | Payer: Self-pay | Source: Ambulatory Visit | Attending: Obstetrics and Gynecology | Admitting: Obstetrics and Gynecology

## 2011-11-18 ENCOUNTER — Encounter (HOSPITAL_COMMUNITY): Payer: Self-pay | Admitting: *Deleted

## 2011-11-18 DIAGNOSIS — A499 Bacterial infection, unspecified: Secondary | ICD-10-CM | POA: Insufficient documentation

## 2011-11-18 DIAGNOSIS — N949 Unspecified condition associated with female genital organs and menstrual cycle: Secondary | ICD-10-CM | POA: Insufficient documentation

## 2011-11-18 DIAGNOSIS — N760 Acute vaginitis: Secondary | ICD-10-CM

## 2011-11-18 DIAGNOSIS — O2390 Unspecified genitourinary tract infection in pregnancy, unspecified trimester: Secondary | ICD-10-CM | POA: Insufficient documentation

## 2011-11-18 DIAGNOSIS — B9689 Other specified bacterial agents as the cause of diseases classified elsewhere: Secondary | ICD-10-CM

## 2011-11-18 LAB — WET PREP, GENITAL
Trich, Wet Prep: NONE SEEN
Yeast Wet Prep HPF POC: NONE SEEN

## 2011-11-18 LAB — URINALYSIS, ROUTINE W REFLEX MICROSCOPIC
Bilirubin Urine: NEGATIVE
Glucose, UA: NEGATIVE mg/dL
Hgb urine dipstick: NEGATIVE
Ketones, ur: NEGATIVE mg/dL
Nitrite: NEGATIVE
Protein, ur: NEGATIVE mg/dL
Specific Gravity, Urine: 1.015 (ref 1.005–1.030)
Urobilinogen, UA: 1 mg/dL (ref 0.0–1.0)
pH: 6.5 (ref 5.0–8.0)

## 2011-11-18 LAB — URINE MICROSCOPIC-ADD ON

## 2011-11-18 MED ORDER — PROMETHAZINE HCL 25 MG PO TABS: 25.0000 mg | ORAL_TABLET | Freq: Four times a day (QID) | ORAL | Status: DC | PRN

## 2011-11-18 MED ORDER — ONDANSETRON 8 MG PO TBDP
8.0000 mg | ORAL_TABLET | Freq: Once | ORAL | Status: AC
  Administered 2011-11-18: 8 mg via ORAL
  Filled 2011-11-18: qty 1

## 2011-11-18 MED ORDER — METOCLOPRAMIDE HCL 10 MG PO TABS: 10.0000 mg | ORAL_TABLET | Freq: Four times a day (QID) | ORAL | Status: DC

## 2011-11-18 MED ORDER — METRONIDAZOLE 500 MG PO TABS: 500.0000 mg | ORAL_TABLET | Freq: Two times a day (BID) | ORAL | Status: AC

## 2011-11-18 NOTE — MAU Note (Signed)
Patient is here with white, thick, odorous vaginal discharge. She states that she is awaiting her medicaid to start prenatal care at [facility]. She denies bleeding and pain

## 2011-11-18 NOTE — MAU Provider Note (Signed)
History     CSN: [id number]  Arrival date and time: 11/18/11 1058   First Provider Initiated Contact with Patient 11/18/11 1146      Chief Complaint  Patient presents with  . Vaginal Discharge   HPI 26 y.o. [id number] at [redacted]w[redacted]d with thick, white, odorous vaginal discharge. Awaiting medicaid to begin [facility] with [facility]. No bleeding or pain.    Past Medical History  Diagnosis Date  . Asthma   . Chlamydia     Past Surgical History  Procedure Date  . Induced abortion     x 2  . Dilation and curettage of uterus     Family History  Problem Relation Age of Onset  . Diabetes Maternal Aunt   . Anesthesia problems Neg Hx     History  Substance Use Topics  . Smoking status: Never Smoker   . Smokeless tobacco: Never Used  . Alcohol Use: Yes     occasional alcohol    Allergies:  Allergies  Allergen Reactions  . Latex Itching    Burning   . Tylenol (Acetaminophen) Hives    No prescriptions prior to admission    Review of Systems  Constitutional: Negative.   Respiratory: Negative.   Cardiovascular: Negative.   Gastrointestinal: Negative for nausea, vomiting, abdominal pain, diarrhea and constipation.  Genitourinary: Negative for dysuria, urgency, frequency, hematuria and flank pain.       Negative for vaginal bleeding, Positive for vaginal discharge  Musculoskeletal: Negative.   Neurological: Negative.   Psychiatric/Behavioral: Negative.    Physical Exam   Blood pressure 111/68, pulse 85, temperature 97 F (36.1 C), temperature source Oral, resp. rate 18, height 5\' 2"  (1.575 m), weight 154 lb (69.854 kg), last menstrual period 09/28/2011, unknown if currently breastfeeding.  Physical Exam  Nursing note and vitals reviewed. Constitutional: She is oriented to person, place, and time. She appears well-developed and well-nourished. No distress.  HENT:  Head: Normocephalic and atraumatic.  Cardiovascular: Normal rate, regular rhythm and normal heart  sounds.   Respiratory: Effort normal and breath sounds normal. No respiratory distress.  GI: Soft. Bowel sounds are normal. She exhibits no distension and no mass. There is no tenderness. There is no rebound and no guarding.  Genitourinary: There is no rash or lesion on the right labia. There is no rash or lesion on the left labia. Uterus is not deviated, not enlarged, not fixed and not tender. Cervix exhibits no motion tenderness, no discharge and no friability. Right adnexum displays no mass, no tenderness and no fullness. Left adnexum displays no mass, no tenderness and no fullness. No erythema, tenderness or bleeding around the vagina. Vaginal discharge found.  Neurological: She is alert and oriented to person, place, and time.  Skin: Skin is warm and dry.  Psychiatric: She has a normal mood and affect.    MAU Course  Procedures Results for orders placed during the hospital encounter of 11/18/11 (from the past 72 hour(s))  URINALYSIS, ROUTINE W REFLEX MICROSCOPIC     Status: Abnormal   Collection Time   11/18/11 11:10 AM      Component Value Range Comment   Color, Urine YELLOW  YELLOW     APPearance CLEAR  CLEAR     Specific Gravity, Urine 1.015  1.005 - 1.030     pH 6.5  5.0 - 8.0     Glucose, UA NEGATIVE  NEGATIVE (mg/dL)    Hgb urine dipstick NEGATIVE  NEGATIVE     Bilirubin Urine NEGATIVE  NEGATIVE     Ketones, ur NEGATIVE  NEGATIVE (mg/dL)    Protein, ur NEGATIVE  NEGATIVE (mg/dL)    Urobilinogen, UA 1.0  0.0 - 1.0 (mg/dL)    Nitrite NEGATIVE  NEGATIVE     Leukocytes, UA TRACE (*) NEGATIVE    URINE MICROSCOPIC-ADD ON     Status: Abnormal   Collection Time   11/18/11 11:10 AM      Component Value Range Comment   Squamous Epithelial / LPF FEW (*) RARE     WBC, UA 0-2  <3 (WBC/hpf)   GC/CHLAMYDIA PROBE AMP, GENITAL     Status: Normal   Collection Time   11/18/11 11:49 AM      Component Value Range Comment   GC Probe Amp, Genital NEGATIVE  NEGATIVE     Chlamydia, DNA Probe  NEGATIVE  NEGATIVE    WET PREP, GENITAL     Status: Abnormal   Collection Time   11/18/11 11:49 AM      Component Value Range Comment   Yeast Wet Prep HPF POC NONE SEEN  NONE SEEN     Trich, Wet Prep NONE SEEN  NONE SEEN     Clue Cells Wet Prep HPF POC FEW (*) NONE SEEN     WBC, Wet Prep HPF POC MANY (*) NONE SEEN  MANY BACTERIA SEEN     Assessment and Plan  25 y.o. [id number] at [redacted]w[redacted]d  BV - rx flagyl F/U for prenatal care as soon as possible  Amron Guerrette 11/18/2011, 11:53 AM

## 2011-11-19 LAB — GC/CHLAMYDIA PROBE AMP, GENITAL
Chlamydia, DNA Probe: NEGATIVE
GC Probe Amp, Genital: NEGATIVE

## 2011-11-20 ENCOUNTER — Encounter (HOSPITAL_COMMUNITY): Payer: Self-pay | Admitting: Advanced Practice Midwife

## 2011-11-20 NOTE — MAU Provider Note (Signed)
Agree with above note.  Shelly Gibbs 11/20/2011 8:39 AM   

## 2011-12-24 ENCOUNTER — Encounter (HOSPITAL_COMMUNITY): Payer: Self-pay | Admitting: Emergency Medicine

## 2011-12-24 ENCOUNTER — Emergency Department (INDEPENDENT_AMBULATORY_CARE_PROVIDER_SITE_OTHER)
Admission: EM | Admit: 2011-12-24 | Discharge: 2011-12-24 | Disposition: A | Discharge status: Home / Self Care | Payer: Self-pay | Source: Home / Self Care | Attending: Emergency Medicine | Admitting: Emergency Medicine

## 2011-12-24 DIAGNOSIS — L0291 Cutaneous abscess, unspecified: Secondary | ICD-10-CM

## 2011-12-24 MED ORDER — TRAMADOL HCL 50 MG PO TABS
100.0000 mg | ORAL_TABLET | Freq: Three times a day (TID) | ORAL | Status: AC | PRN
Start: 2011-12-24 — End: 2012-01-03

## 2011-12-24 MED ORDER — SULFAMETHOXAZOLE-TMP DS 800-160 MG PO TABS: 2.0000 | ORAL_TABLET | Freq: Two times a day (BID) | ORAL | Status: AC

## 2011-12-24 NOTE — ED Notes (Signed)
Bump under rt arm for 1 week. Has had prior incident to same

## 2011-12-24 NOTE — ED Provider Notes (Signed)
Chief Complaint  Patient presents with  . Mass    History of Present Illness:   Shelly Gibbs is a 26 year old female who has had a one-week history of painful boil in her right axilla. This has not drained any pus. She denies any fever or chills. She has had no prior history of abscesses or boils. No history of MRSA or diabetes.  Review of Systems:  Other than noted above, the patient denies any of the following symptoms: Systemic:  No fever, chills or sweats. Skin:  No rash or itching.  PMFSH:  Past medical history, family history, social history, meds, and allergies were reviewed.  No history of diabetes or prior history of abscesses or MRSA.  Physical Exam:   Vital signs:  BP 131/88  Pulse 77  Temp(Src) 99 F (37.2 C) (Oral)  Resp 18  SpO2 100%  LMP 09/28/2011  Breastfeeding? Unknown Skin:  There was a 3 cm, elongated, tender abscess in the right axilla. This was fluctuant but not draining any pus. otherwise normal.  No rash.  Procedure:  Verbal informed consent was obtained.  The patient was informed of the risks and benefits of the procedure and understands and accepts.  Identity of the patient was verified verbally and by wristband.   The abscess area described above was prepped with Betadine and alcohol and anesthetized with 3 mL of 2% Xylocaine with epinephrine.  Using a #11 scalpel blade, a singe straight incision was made into the area of fluctulence, yielding a large amount of prurulent drainage.  Routine cultures were obtained.  Blunt dissection was used to break up loculations and the resulting wound cavity was packed with 1/4 inch Iodoform gauze.  A sterile pressure dressing was applied.  Assessment:  The encounter diagnosis was Abscess.  Plan:   1.  The following meds were prescribed:   New Prescriptions   SULFAMETHOXAZOLE-TRIMETHOPRIM (BACTRIM DS) 800-160 MG PER TABLET    Take 2 tablets by mouth 2 (two) times daily.   TRAMADOL (ULTRAM) 50 MG TABLET    Take 2 tablets (100  mg total) by mouth every 8 (eight) hours as needed for pain.   2.  The patient was instructed in symptomatic care and handouts were given. 3.  The patient was instructed to leave the dressing in place and return again in 48 hours for packing removal.   Reuben Likes, MD 12/24/11 2229

## 2011-12-24 NOTE — Discharge Instructions (Signed)

## 2011-12-26 ENCOUNTER — Emergency Department (INDEPENDENT_AMBULATORY_CARE_PROVIDER_SITE_OTHER)
Admission: EM | Admit: 2011-12-26 | Discharge: 2011-12-26 | Disposition: A | Discharge status: Home / Self Care | Payer: Self-pay | Source: Home / Self Care | Attending: Emergency Medicine | Admitting: Emergency Medicine

## 2011-12-26 ENCOUNTER — Encounter (HOSPITAL_COMMUNITY): Payer: Self-pay | Admitting: *Deleted

## 2011-12-26 DIAGNOSIS — L02411 Cutaneous abscess of right axilla: Secondary | ICD-10-CM

## 2011-12-26 DIAGNOSIS — IMO0002 Reserved for concepts with insufficient information to code with codable children: Secondary | ICD-10-CM

## 2011-12-26 MED ORDER — HYDROCODONE-IBUPROFEN 7.5-200 MG PO TABS: 1.0000 | ORAL_TABLET | Freq: Four times a day (QID) | ORAL | Status: DC | PRN

## 2011-12-26 NOTE — ED Provider Notes (Signed)
Chief Complaint  Patient presents with  . Wound Check    History of Present Illness:   Shelly Gibbs is a 26 year old female who comes in today for recheck of abscess and removal of packing. She has left a dressing in place is been taking the antibiotics. It still slightly tender to touch. She denies any fever or chills.  Review of Systems:  Other than noted above, the patient denies any of the following symptoms: Systemic:  No fever, chills, sweats, weight loss, or fatigue. ENT:  No nasal congestion, rhinorrhea, sore throat, swelling of lips, tongue or throat. Resp:  No cough, wheezing, or shortness of breath. Skin:  No rash, itching, nodules, or suspicious lesions.  PMFSH:  Past medical history, family history, social history, meds, and allergies were reviewed.  Physical Exam:   Vital signs:  BP 127/82  Pulse 77  Temp(Src) 98.7 F (37.1 C) (Oral)  Resp 18  SpO2 100%  LMP 09/28/2011 Gen:  Alert, oriented, in no distress. Skin:  The dressing was in place and was removed. The packing was then removed and a small amount of pus was drained out. Thereafter the wound cavity appeared clear and clean. There was a little bit of surrounding induration. No other skin lesions noted.  Course in Urgent Care Center:   Was cleansed and redressed. The packing was left out. She was instructed in wound care and should finish up her antibiotics.  Assessment:  The encounter diagnosis was Abscess of right axilla.  Plan:   1.  The following meds were prescribed:   New Prescriptions   HYDROCODONE-IBUPROFEN (VICOPROFEN) 7.5-200 MG PER TABLET    Take 1 tablet by mouth every 6 (six) hours as needed for pain.   2.  The patient was instructed in symptomatic care and handouts were given. 3.  The patient was told to return if becoming worse in any way, if no better in 3 or 4 days, and given some red flag symptoms that would indicate earlier return.     Reuben Likes, MD 12/26/11 2059

## 2011-12-26 NOTE — Discharge Instructions (Signed)

## 2011-12-26 NOTE — ED Notes (Signed)
Pt  Had  Io  And  D  2  Days  Ago  r  Armpit  Had packing  Here  Today  For  Wound  Check packing  Removal       She reports taking  rx    AND  IT FEELS  SOMEWHAT  BETTER

## 2011-12-29 LAB — CULTURE, ROUTINE-ABSCESS

## 2011-12-30 ENCOUNTER — Encounter (HOSPITAL_COMMUNITY): Payer: Self-pay | Admitting: *Deleted

## 2011-12-30 ENCOUNTER — Inpatient Hospital Stay (HOSPITAL_COMMUNITY)
Admission: AD | Admit: 2011-12-30 | Discharge: 2011-12-30 | Disposition: A | Discharge status: Hospice | Payer: Self-pay | Source: Ambulatory Visit | Attending: Obstetrics & Gynecology | Admitting: Obstetrics & Gynecology

## 2011-12-30 DIAGNOSIS — B373 Candidiasis of vulva and vagina: Secondary | ICD-10-CM

## 2011-12-30 DIAGNOSIS — A499 Bacterial infection, unspecified: Secondary | ICD-10-CM | POA: Insufficient documentation

## 2011-12-30 DIAGNOSIS — B3731 Acute candidiasis of vulva and vagina: Secondary | ICD-10-CM | POA: Insufficient documentation

## 2011-12-30 DIAGNOSIS — L293 Anogenital pruritus, unspecified: Secondary | ICD-10-CM | POA: Insufficient documentation

## 2011-12-30 DIAGNOSIS — B9689 Other specified bacterial agents as the cause of diseases classified elsewhere: Secondary | ICD-10-CM | POA: Insufficient documentation

## 2011-12-30 DIAGNOSIS — N760 Acute vaginitis: Secondary | ICD-10-CM | POA: Insufficient documentation

## 2011-12-30 LAB — WET PREP, GENITAL
Trich, Wet Prep: NONE SEEN
Yeast Wet Prep HPF POC: NONE SEEN

## 2011-12-30 MED ORDER — METRONIDAZOLE 500 MG PO TABS: 500.0000 mg | ORAL_TABLET | Freq: Two times a day (BID) | ORAL | Status: AC

## 2011-12-30 MED ORDER — CLOTRIMAZOLE 1 % EX CREA: TOPICAL_CREAM | Freq: Two times a day (BID) | CUTANEOUS | Status: DC

## 2011-12-30 MED ORDER — FLUCONAZOLE 150 MG PO TABS: 150.0000 mg | ORAL_TABLET | Freq: Once | ORAL | Status: AC

## 2011-12-30 MED ORDER — FLUCONAZOLE 150 MG PO TABS
150.0000 mg | ORAL_TABLET | ORAL | Status: AC
  Administered 2011-12-30: 150 mg via ORAL
  Filled 2011-12-30: qty 1

## 2011-12-30 NOTE — Discharge Instructions (Signed)
Candida Infection, Adult A candida infection (also called yeast, fungus and Monilia infection) is an overgrowth of yeast that can occur anywhere on the body. A yeast infection commonly occurs in warm, moist body areas. Usually, the infection remains localized but can spread to become a systemic infection. A yeast infection may be a sign of a more severe disease such as diabetes, leukemia, or AIDS. A yeast infection can occur in both men and women. In women, Candida vaginitis is a vaginal infection. It is one of the most common causes of vaginitis. Men usually do not have symptoms or know they have an infection until other problems develop. Men may find out they have a yeast infection because their sex partner has a yeast infection. Uncircumcised men are more likely to get a yeast infection than circumcised men. This is because the uncircumcised glans is not exposed to air and does not remain as dry as that of a circumcised glans. Older adults may develop yeast infections around dentures. CAUSES  Women  Antibiotics.   Steroid medication taken for a long time.   Being overweight (obese).   Diabetes.   Poor immune condition.   Certain serious medical conditions.   Immune suppressive medications for organ transplant patients.   Chemotherapy.   Pregnancy.   Menstration.   Stress and fatigue.   Intravenous drug use.   Oral contraceptives.   Wearing tight-fitting clothes in the crotch area.   Catching it from a sex partner who has a yeast infection.   Spermicide.   Intravenous, urinary, or other catheters.  Men  Catching it from a sex partner who has a yeast infection.   Having oral or anal sex with a person who has the infection.   Spermicide.   Diabetes.   Antibiotics.   Poor immune system.   Medications that suppress the immune system.   Intravenous drug use.   Intravenous, urinary, or other catheters.  SYMPTOMS  Women  Thick, white vaginal discharge.    Vaginal itching.   Redness and swelling in and around the vagina.   Irritation of the lips of the vagina and perineum.   Blisters on the vaginal lips and perineum.   Painful sexual intercourse.   Low blood sugar (hypoglycemia).   Painful urination.   Bladder infections.   Intestinal problems such as constipation, indigestion, bad breath, bloating, increase in gas, diarrhea, or loose stools.  Men  Men may develop intestinal problems such as constipation, indigestion, bad breath, bloating, increase in gas, diarrhea, or loose stools.   Dry, cracked skin on the penis with itching or discomfort.   Jock itch.   Dry, flaky skin.   Athlete's foot.   Hypoglycemia.  DIAGNOSIS  Women  A history and an exam are performed.   The discharge may be examined under a microscope.   A culture may be taken of the discharge.  Men  A history and an exam are performed.   Any discharge from the penis or areas of cracked skin will be looked at under the microscope and cultured.   Stool samples may be cultured.  TREATMENT  Women  Vaginal antifungal suppositories and creams.   Medicated creams to decrease irritation and itching on the outside of the vagina.   Warm compresses to the perineal area to decrease swelling and discomfort.   Oral antifungal medications.   Medicated vaginal suppositories or cream for repeated or recurrent infections.   Wash and dry the irritation areas before applying the cream.     Eating yogurt with lactobacillus may help with prevention and treatment.   Sometimes painting the vagina with gentian violet solution may help if creams and suppositories do not work.  Men  Antifungal creams and oral antifungal medications.   Sometimes treatment must continue for 30 days after the symptoms go away to prevent recurrence.  HOME CARE INSTRUCTIONS  Women  Use cotton underwear and avoid tight-fitting clothing.   Avoid colored, scented toilet paper and  deodorant tampons or pads.   Do not douche.   Keep your diabetes under control.   Finish all the prescribed medications.   Keep your skin clean and dry.   Consume milk or yogurt with lactobacillus active culture regularly. If you get frequent yeast infections and think that is what the infection is, there are over-the-counter medications that you can get. If the infection does not show healing in 3 days, talk to your caregiver.   Tell your sex partner you have a yeast infection. Your partner may need treatment also, especially if your infection does not clear up or recurs.  Men  Keep your skin clean and dry.   Keep your diabetes under control.   Finish all prescribed medications.   Tell your sex partner that you have a yeast infection so they can be treated if necessary.  SEEK MEDICAL CARE IF:   Your symptoms do not clear up or worsen in one week after treatment.   You have an oral temperature above 102 F (38.9 C).   You have trouble swallowing or eating for a prolonged time.   You develop blisters on and around your vagina.   You develop vaginal bleeding and it is not your menstrual period.   You develop abdominal pain.   You develop intestinal problems as mentioned above.   You get weak or lightheaded.   You have painful or increased urination.   You have pain during sexual intercourse.  MAKE SURE YOU:   Understand these instructions.   Will watch your condition.   Will get help right away if you are not doing well or get worse.  Document Released: 09/05/2004 Document Revised: 07/18/2011 Document Reviewed: 12/18/2009 Exit[employer] Patient Information 2012 [employer].Bacterial Vaginosis Bacterial vaginosis (BV) is a vaginal infection where the normal balance of bacteria in the vagina is disrupted. The normal balance is then replaced by an overgrowth of certain bacteria. There are several different kinds of bacteria that can cause BV. BV is the most common  vaginal infection in women of childbearing age. CAUSES   The cause of BV is not fully understood. BV develops when there is an increase or imbalance of harmful bacteria.   Some activities or behaviors can upset the normal balance of bacteria in the vagina and put women at increased risk including:   Having a new sex partner or multiple sex partners.   Douching.   Using an intrauterine device (IUD) for contraception.   It is not clear what role sexual activity plays in the development of BV. However, women that have never had sexual intercourse are rarely infected with BV.  Women do not get BV from toilet seats, bedding, swimming pools or from touching objects around them.  SYMPTOMS   Grey vaginal discharge.   A fish-like odor with discharge, especially after sexual intercourse.   Itching or burning of the vagina and vulva.   Burning or pain with urination.   Some women have no signs or symptoms at all.  DIAGNOSIS  Your caregiver must examine   the vagina for signs of BV. Your caregiver will perform lab tests and look at the sample of vaginal fluid through a microscope. They will look for bacteria and abnormal cells (clue cells), a pH test higher than 4.5, and a positive amine test all associated with BV.  RISKS AND COMPLICATIONS   Pelvic inflammatory disease (PID).   Infections following gynecology surgery.   Developing HIV.   Developing herpes virus.  TREATMENT  Sometimes BV will clear up without treatment. However, all women with symptoms of BV should be treated to avoid complications, especially if gynecology surgery is planned. Female partners generally do not need to be treated. However, BV may spread between female sex partners so treatment is helpful in preventing a recurrence of BV.   BV may be treated with antibiotics. The antibiotics come in either pill or vaginal cream forms. Either can be used with nonpregnant or pregnant women, but the recommended dosages differ.  These antibiotics are not harmful to the baby.   BV can recur after treatment. If this happens, a second round of antibiotics will often be prescribed.   Treatment is important for pregnant women. If not treated, BV can cause a premature delivery, especially for a pregnant woman who had a premature birth in the past. All pregnant women who have symptoms of BV should be checked and treated.   For chronic reoccurrence of BV, treatment with a type of prescribed gel vaginally twice a week is helpful.  HOME CARE INSTRUCTIONS   Finish all medication as directed by your caregiver.   Do not have sex until treatment is completed.   Tell your sexual partner that you have a vaginal infection. They should see their caregiver and be treated if they have problems, such as a mild rash or itching.   Practice safe sex. Use condoms. Only have 1 sex partner.  PREVENTION  Basic prevention steps can help reduce the risk of upsetting the natural balance of bacteria in the vagina and developing BV:  Do not have sexual intercourse (be abstinent).   Do not douche.   Use all of the medicine prescribed for treatment of BV, even if the signs and symptoms go away.   Tell your sex partner if you have BV. That way, they can be treated, if needed, to prevent reoccurrence.  SEEK MEDICAL CARE IF:   Your symptoms are not improving after 3 days of treatment.   You have increased discharge, pain, or fever.  MAKE SURE YOU:   Understand these instructions.   Will watch your condition.   Will get help right away if you are not doing well or get worse.  FOR MORE INFORMATION  Division of STD Prevention (DSTDP), Centers for Disease Control and Prevention: www.cdc.gov/std American Social Health Association (ASHA): www.ashastd.org  Document Released: 07/29/2005 Document Revised: 07/18/2011 Document Reviewed: 01/19/2009 Exit[employer] Patient Information 2012 [employer]. 

## 2011-12-30 NOTE — MAU Note (Signed)
C/O terrible vaginal itch for the past four days.  States she is taking  amoxicillin and bactrim for a boil under her arm and post abortion prophylaxis.

## 2011-12-30 NOTE — MAU Note (Signed)
Patient states she was recently given an antibiotic for a boil under her arm. Now has a vaginal itch which she thinks may be a yeast infection. Patient terminated a pregnancy on 5-2 but has not had her follow up.

## 2011-12-30 NOTE — MAU Provider Note (Signed)
Attestation of Attending Supervision of Advanced Practitioner: Evaluation and management procedures were performed by the [facility] Fellow/PA/CNM/NP under my supervision and collaboration. Chart reviewed, and agree with management and plan.  Jaynie Collins, M.D. 12/30/2011 8:25 PM

## 2011-12-30 NOTE — MAU Provider Note (Signed)
History     CSN: [id number]  Arrival date and time: 12/30/11 1548   First Provider Initiated Contact with Patient 12/30/11 1708      No chief complaint on file.  HPI Pt presents to MAU with vaginal itching/burning x2 days and is having some light vaginal bleeding following pregnancy termination earlier this month.  She has been taking abx following termination and for boil under her arm.  She reports little discharge with the infection but reports she has frequent yeast and bacterial infections, especially each month after menses.  She denies urinary symptoms, h/a, dizziness, n/v, or fever/chills.    OB History    Grav Para Term Preterm Abortions TAB SAB Ect Mult Living   7 1 1  0 5 4 1  0 0 1      Past Medical History  Diagnosis Date  . Asthma   . Chlamydia     Past Surgical History  Procedure Date  . Induced abortion     x 2  . Dilation and curettage of uterus     Family History  Problem Relation Age of Onset  . Diabetes Maternal Aunt   . Anesthesia problems Neg Hx     History  Substance Use Topics  . Smoking status: Never Smoker   . Smokeless tobacco: Never Used  . Alcohol Use: Yes     occasional alcohol    Allergies:  Allergies  Allergen Reactions  . Latex Itching    Burning   . Tylenol (Acetaminophen) Hives    Prescriptions prior to admission  Medication Sig Dispense Refill  . amoxicillin (AMOXIL) 500 MG capsule Take 500 mg by mouth 2 (two) times daily.      Marland Kitchen sulfamethoxazole-trimethoprim (BACTRIM DS) 800-160 MG per tablet Take 2 tablets by mouth 2 (two) times daily.  40 tablet  0  . traMADol (ULTRAM) 50 MG tablet Take 2 tablets (100 mg total) by mouth every 8 (eight) hours as needed for pain.  30 tablet  0    Review of Systems  Constitutional: Negative for fever, chills and malaise/fatigue.  Eyes: Negative for blurred vision.  Respiratory: Negative for cough and shortness of breath.   Cardiovascular: Negative for chest pain.  Gastrointestinal:  Negative for heartburn, nausea and vomiting.  Genitourinary: Negative for dysuria, urgency and frequency.  Musculoskeletal: Negative.   Neurological: Negative for dizziness and headaches.  Psychiatric/Behavioral: Negative for depression.   Physical Exam   Blood pressure 120/85, pulse 69, temperature 98.5 F (36.9 C), temperature source Oral, resp. rate 16, height 5' 1.5" (1.562 m), weight 68.947 kg (152 lb), last menstrual period 09/28/2011, SpO2 100.00%.  Physical Exam  Nursing note and vitals reviewed. Constitutional: She is oriented to person, place, and time. She appears well-developed and well-nourished.  Neck: Normal range of motion.  Cardiovascular: Normal rate.   Respiratory: Effort normal.  GI: Soft.  Genitourinary:       Pelvic exam: Cervix pink, visually closed, without lesion, scant clear discharge, vaginal walls and external genitalia erythemetous with significant tenderness to touch externally    Musculoskeletal: Normal range of motion.  Neurological: She is alert and oriented to person, place, and time.  Skin: Skin is warm and dry.  Psychiatric: She has a normal mood and affect. Her behavior is normal. Judgment and thought content normal.    MAU Course  Procedures U/A, wet prep, GC/Chlamydia Results for orders placed during the hospital encounter of 12/30/11 (from the past 24 hour(s))  WET PREP, GENITAL  Status: Abnormal   Collection Time   12/30/11  5:10 PM      Component Value Range   Yeast Wet Prep HPF POC NONE SEEN  NONE SEEN    Trich, Wet Prep NONE SEEN  NONE SEEN    Clue Cells Wet Prep HPF POC MODERATE (*) NONE SEEN    WBC, Wet Prep HPF POC MANY (*) NONE SEEN    Assessment and Plan  Candida vaginitis Bacterial vaginosis  Diflucan 150 mg PO x1 dose in MAU D/C home Diflucan 150 mg PO for second dose in 2 days Flagyl 500 mg BID x7 days to start after pt finishes other abx Lotrimin 1% cream for topical use Recommend Pro B probiotic Message sent to  Gyn clinic for f/u for frequent candidal infection/BV Return to MAU as needed  LEFTWICH-KIRBY, Kalianne Fetting 12/30/2011, 5:29 PM

## 2011-12-31 LAB — GC/CHLAMYDIA PROBE AMP, GENITAL
Chlamydia, DNA Probe: NEGATIVE
GC Probe Amp, Genital: NEGATIVE

## 2012-01-22 ENCOUNTER — Encounter: Payer: Self-pay | Admitting: Obstetrics and Gynecology

## 2012-02-27 ENCOUNTER — Encounter (HOSPITAL_COMMUNITY): Payer: Self-pay | Admitting: *Deleted

## 2012-02-27 ENCOUNTER — Inpatient Hospital Stay (HOSPITAL_COMMUNITY)
Admission: AD | Admit: 2012-02-27 | Discharge: 2012-02-27 | Disposition: A | Discharge status: Home / Self Care | Payer: Self-pay | Source: Ambulatory Visit | Attending: Family Medicine | Admitting: Family Medicine

## 2012-02-27 DIAGNOSIS — A499 Bacterial infection, unspecified: Secondary | ICD-10-CM

## 2012-02-27 DIAGNOSIS — B3731 Acute candidiasis of vulva and vagina: Secondary | ICD-10-CM | POA: Insufficient documentation

## 2012-02-27 DIAGNOSIS — N949 Unspecified condition associated with female genital organs and menstrual cycle: Secondary | ICD-10-CM | POA: Insufficient documentation

## 2012-02-27 DIAGNOSIS — B373 Candidiasis of vulva and vagina: Secondary | ICD-10-CM | POA: Insufficient documentation

## 2012-02-27 DIAGNOSIS — J301 Allergic rhinitis due to pollen: Secondary | ICD-10-CM | POA: Insufficient documentation

## 2012-02-27 DIAGNOSIS — N760 Acute vaginitis: Secondary | ICD-10-CM

## 2012-02-27 DIAGNOSIS — B9689 Other specified bacterial agents as the cause of diseases classified elsewhere: Secondary | ICD-10-CM

## 2012-02-27 HISTORY — DX: Candidiasis, unspecified: B37.9

## 2012-02-27 HISTORY — DX: Urinary tract infection, site not specified: N39.0

## 2012-02-27 LAB — URINALYSIS, ROUTINE W REFLEX MICROSCOPIC
Bilirubin Urine: NEGATIVE
Glucose, UA: NEGATIVE mg/dL
Hgb urine dipstick: NEGATIVE
Ketones, ur: NEGATIVE mg/dL
Leukocytes, UA: NEGATIVE
Nitrite: NEGATIVE
Protein, ur: NEGATIVE mg/dL
Specific Gravity, Urine: 1.015 (ref 1.005–1.030)
Urobilinogen, UA: 0.2 mg/dL (ref 0.0–1.0)
pH: 6 (ref 5.0–8.0)

## 2012-02-27 LAB — POCT PREGNANCY, URINE: Preg Test, Ur: NEGATIVE

## 2012-02-27 MED ORDER — METRONIDAZOLE 500 MG PO TABS: 500.0000 mg | ORAL_TABLET | Freq: Two times a day (BID) | ORAL | Status: AC

## 2012-02-27 MED ORDER — FLUCONAZOLE 150 MG PO TABS: 150.0000 mg | ORAL_TABLET | Freq: Once | ORAL | Status: AC

## 2012-02-27 NOTE — MAU Note (Signed)
C/o yelowish,smelly vaginal discharge for about a week and a half; c/o lower back pain for past week; c/o severe sore throat with a "baby" cough; was treated for BV and then had a yeast infection; she treated yeast infection with Monistat over- the- counter; has some nausea and loss of appetite;

## 2012-02-27 NOTE — MAU Provider Note (Signed)
History    CSN: [id number]  Arrival date and time: 02/27/12 0907   First Provider Initiated Contact with Patient 02/27/12 [phone]   Chief Complaint  Patient presents with  . Vaginal Discharge   HPI Pt presents with complaints of yellow, malodorous, vaginal discharge. Denies itching or burning. Denies abdominal pain/cramping and vaginal bleeding. No new sexual partners. She was here in May and treated for BV and candida, negative GC/Chlamydia at the time. Reports she completed the treatment successfully and had a short period of time without symptoms. The malodorous discharge returned 1-2 weeks ago. Pt states she takes Pro-B daily. Recently changed to a different scented laundry detergent.  Pt also complains of 1 week history or sore throat and nasal congestion. Denies sick contacts and fever.   OB History    Grav Para Term Preterm Abortions TAB SAB Ect Mult Living   7 1 1  0 5 4 1  0 0 1      Past Medical History  Diagnosis Date  . Asthma   . Chlamydia   . UTI (lower urinary tract infection)   . Yeast infection     Past Surgical History  Procedure Date  . Induced abortion     x 2  . Dilation and curettage of uterus     Family History  Problem Relation Age of Onset  . Diabetes Maternal Aunt   . Anesthesia problems Neg Hx   . Other Neg Hx     History  Substance Use Topics  . Smoking status: Never Smoker   . Smokeless tobacco: Never Used  . Alcohol Use: Yes     occasional alcohol    Allergies:  Allergies  Allergen Reactions  . Latex Itching    Burning   . Tylenol (Acetaminophen) Hives    Prescriptions prior to admission  Medication Sig Dispense Refill  . amoxicillin (AMOXIL) 500 MG capsule Take 500 mg by mouth 2 (two) times daily.      . clotrimazole (LOTRIMIN) 1 % cream Apply topically 2 (two) times daily.  30 g  0    Review of Systems  Constitutional: Negative for fever and malaise/fatigue.  HENT: Positive for congestion and sore throat. Negative for ear  pain.   Respiratory: Negative for cough, sputum production and shortness of breath.   Gastrointestinal: Negative for nausea, vomiting and abdominal pain.  Neurological: Negative for headaches.   Physical Exam   Blood pressure 126/84, pulse 75, temperature 97.4 F (36.3 C), temperature source Oral, resp. rate 16, height 5\' 2"  (1.575 m), weight 69.4 kg (153 lb), last menstrual period 02/15/2012.  Physical Exam  Constitutional: She is oriented to person, place, and time. She appears well-developed and well-nourished. No distress.  HENT:  Head: Normocephalic and atraumatic.       Ears: cerumen observed in canals, no tenderness on exam. Left canal without erythema, TM clear. Right canal without erythema, TM clear, fluid present behind.  Mouth: Buccal mucosa pink and moist. Posterior pharynx erythematous without exudate. Tonsils pink and moist.  Cardiovascular: Normal rate and regular rhythm.   Respiratory: Effort normal and breath sounds normal.  GI: Soft. There is no tenderness.  Genitourinary: No erythema, tenderness or bleeding around the vagina. Injury: malodorous, white. Vaginal discharge found.       Labia without lesions.  Adnexa: no masses, tenderness, or fullness. Cervix: no CMT, friability, or discharge. Uterus: small, movable, without tenderness or masses  Neurological: She is alert and oriented to person, place, and time.  Skin: Skin  is warm and dry.  Psychiatric: She has a normal mood and affect. Her behavior is normal.   MAU Course  Procedures  MDM Pt with h/o recurrent BV and candida infections, presents with similar symptom pattern.  Ordered wet prep. - Wet prep lost in transit, will treat based on positive whiff and patient s/s hx. Negative urine pregnancy.  Sore throat: postnasal drip secondary to seasonal allergies.  Results for orders placed during the hospital encounter of 02/27/12 (from the past 24 hour(s))  URINALYSIS, ROUTINE W REFLEX MICROSCOPIC      Status: Normal   Collection Time   02/27/12  9:10 AM      Component Value Range   Color, Urine YELLOW  YELLOW   APPearance CLEAR  CLEAR   Specific Gravity, Urine 1.015  1.005 - 1.030   pH 6.0  5.0 - 8.0   Glucose, UA NEGATIVE  NEGATIVE mg/dL   Hgb urine dipstick NEGATIVE  NEGATIVE   Bilirubin Urine NEGATIVE  NEGATIVE   Ketones, ur NEGATIVE  NEGATIVE mg/dL   Protein, ur NEGATIVE  NEGATIVE mg/dL   Urobilinogen, UA 0.2  0.0 - 1.0 mg/dL   Nitrite NEGATIVE  NEGATIVE   Leukocytes, UA NEGATIVE  NEGATIVE  POCT PREGNANCY, URINE     Status: Normal   Collection Time   02/27/12 10:14 AM      Component Value Range   Preg Test, Ur NEGATIVE  NEGATIVE   Assessment and Plan  A: 1. Bacterial Vaginosis. candida  2. Seasonal allergies  P: 1. Flagyl 500 mg twice daily for 7 days.  Pt counseling: discussed daily probiotics and yogurt, non-scented laundry detergent, and changing panty-liners.   2. Diflucan 150 mg one time   3. Recommended Claritin or other OTC second generation antihistamine for allergy symptoms if bothersome.  [place] 02/27/2012, 9:42 AM   I have seen/examined this patient and agree with the above assessment and plan. Katerra Ingman E.

## 2012-02-27 NOTE — MAU Provider Note (Signed)
Chart reviewed and agree with management and plan.  

## 2012-05-07 ENCOUNTER — Encounter (HOSPITAL_COMMUNITY): Payer: Self-pay | Admitting: *Deleted

## 2012-05-07 ENCOUNTER — Inpatient Hospital Stay (HOSPITAL_COMMUNITY)
Admission: AD | Admit: 2012-05-07 | Discharge: 2012-05-07 | Disposition: A | Discharge status: Home / Self Care | Payer: Self-pay | Source: Ambulatory Visit | Attending: Obstetrics & Gynecology | Admitting: Obstetrics & Gynecology

## 2012-05-07 DIAGNOSIS — N949 Unspecified condition associated with female genital organs and menstrual cycle: Secondary | ICD-10-CM | POA: Insufficient documentation

## 2012-05-07 DIAGNOSIS — B9689 Other specified bacterial agents as the cause of diseases classified elsewhere: Secondary | ICD-10-CM | POA: Insufficient documentation

## 2012-05-07 DIAGNOSIS — A499 Bacterial infection, unspecified: Secondary | ICD-10-CM | POA: Insufficient documentation

## 2012-05-07 DIAGNOSIS — L02429 Furuncle of limb, unspecified: Secondary | ICD-10-CM | POA: Insufficient documentation

## 2012-05-07 DIAGNOSIS — N760 Acute vaginitis: Secondary | ICD-10-CM | POA: Insufficient documentation

## 2012-05-07 HISTORY — DX: Headache: R51

## 2012-05-07 LAB — URINALYSIS, ROUTINE W REFLEX MICROSCOPIC
Bilirubin Urine: NEGATIVE
Glucose, UA: NEGATIVE mg/dL
Hgb urine dipstick: NEGATIVE
Ketones, ur: NEGATIVE mg/dL
Nitrite: NEGATIVE
Protein, ur: NEGATIVE mg/dL
Specific Gravity, Urine: 1.02 (ref 1.005–1.030)
Urobilinogen, UA: 0.2 mg/dL (ref 0.0–1.0)
pH: 6 (ref 5.0–8.0)

## 2012-05-07 LAB — URINE MICROSCOPIC-ADD ON

## 2012-05-07 LAB — WET PREP, GENITAL
Trich, Wet Prep: NONE SEEN
Yeast Wet Prep HPF POC: NONE SEEN

## 2012-05-07 LAB — POCT PREGNANCY, URINE: Preg Test, Ur: NEGATIVE

## 2012-05-07 MED ORDER — CLINDAMYCIN HCL 300 MG PO CAPS: 300.0000 mg | ORAL_CAPSULE | Freq: Two times a day (BID) | ORAL | Status: DC

## 2012-05-07 NOTE — MAU Provider Note (Signed)
History     CSN: [id number]  Arrival date and time: 05/07/12 4098   First Provider Initiated Contact with Patient 05/07/12 [phone]      Chief Complaint  Patient presents with  . Recurrent Skin Infections  . Possible Pregnancy  . Vaginal Discharge   HPI 26 y.o. [phone] with recurrent lump in right arm, gets better with antibiotics then comes back, has taken Bactrim for it in the past. Also c/o vaginal discharge and odor, happens after every period. Patient's last menstrual period was 04/09/2012.    Past Medical History  Diagnosis Date  . Asthma   . Chlamydia   . UTI (lower urinary tract infection)   . Yeast infection   . Headache     Past Surgical History  Procedure Date  . Induced abortion     x 2  . Dilation and curettage of uterus     Family History  Problem Relation Age of Onset  . Diabetes Maternal Aunt   . Anesthesia problems Neg Hx   . Other Neg Hx   . Hearing loss Neg Hx     History  Substance Use Topics  . Smoking status: Never Smoker   . Smokeless tobacco: Never Used  . Alcohol Use: Yes     occasional alcohol    Allergies:  Allergies  Allergen Reactions  . Latex Itching    Burning   . Tylenol (Acetaminophen) Hives    No prescriptions prior to admission    Review of Systems  Constitutional: Negative.   Respiratory: Negative.   Cardiovascular: Negative.   Gastrointestinal: Negative for nausea, vomiting, abdominal pain, diarrhea and constipation.  Genitourinary: Negative for dysuria, urgency, frequency, hematuria and flank pain.       Positive for vaginal discharge   Musculoskeletal: Negative.   Neurological: Negative.   Psychiatric/Behavioral: Negative.    Physical Exam   Blood pressure 116/78, pulse 66, temperature 97 F (36.1 C), temperature source Oral, resp. rate 16, height 5' 2.5" (1.588 m), weight 156 lb (70.761 kg), last menstrual period 04/09/2012.  Physical Exam  Constitutional: She is oriented to person, place, and time.  She appears well-developed and well-nourished. No distress.  HENT:  Head: Normocephalic and atraumatic.  Cardiovascular: Normal rate, regular rhythm and normal heart sounds.   Respiratory: Effort normal and breath sounds normal. No respiratory distress.  GI: Soft. Bowel sounds are normal. She exhibits no distension and no mass. There is no tenderness. There is no rebound and no guarding.  Genitourinary: There is no rash or lesion on the right labia. There is no rash or lesion on the left labia. Uterus is not deviated, not enlarged, not fixed and not tender. Cervix exhibits no motion tenderness, no discharge and no friability. Right adnexum displays no mass, no tenderness and no fullness. Left adnexum displays no mass, no tenderness and no fullness. No erythema, tenderness or bleeding around the vagina. Vaginal discharge (light brown, malodorous) found.  Neurological: She is alert and oriented to person, place, and time.  Skin: Skin is warm and dry.     Psychiatric: She has a normal mood and affect.    MAU Course  Procedures  Results for orders placed during the hospital encounter of 05/07/12 (from the past 24 hour(s))  URINALYSIS, ROUTINE W REFLEX MICROSCOPIC     Status: Abnormal   Collection Time   05/07/12  8:42 AM      Component Value Range   Color, Urine YELLOW  YELLOW   APPearance HAZY (*) CLEAR  Specific Gravity, Urine 1.020  1.005 - 1.030   pH 6.0  5.0 - 8.0   Glucose, UA NEGATIVE  NEGATIVE mg/dL   Hgb urine dipstick NEGATIVE  NEGATIVE   Bilirubin Urine NEGATIVE  NEGATIVE   Ketones, ur NEGATIVE  NEGATIVE mg/dL   Protein, ur NEGATIVE  NEGATIVE mg/dL   Urobilinogen, UA 0.2  0.0 - 1.0 mg/dL   Nitrite NEGATIVE  NEGATIVE   Leukocytes, UA TRACE (*) NEGATIVE  URINE MICROSCOPIC-ADD ON     Status: Abnormal   Collection Time   05/07/12  8:42 AM      Component Value Range   Squamous Epithelial / LPF FEW (*) RARE   WBC, UA 0-2  <3 WBC/hpf   Bacteria, UA FEW (*) RARE  POCT  PREGNANCY, URINE     Status: Normal   Collection Time   05/07/12  8:51 AM      Component Value Range   Preg Test, Ur NEGATIVE  NEGATIVE  WET PREP, GENITAL     Status: Abnormal   Collection Time   05/07/12  9:20 AM      Component Value Range   Yeast Wet Prep HPF POC NONE SEEN  NONE SEEN   Trich, Wet Prep NONE SEEN  NONE SEEN   Clue Cells Wet Prep HPF POC FEW (*) NONE SEEN   WBC, Wet Prep HPF POC TOO NUMEROUS TO COUNT (*) NONE SEEN     Assessment and Plan   1. BV (bacterial vaginosis)   2. Furuncle of axilla      Medication List     As of 05/07/2012  1:32 PM    START taking these medications         clindamycin 300 MG capsule   Commonly known as: CLEOCIN   Take 1 capsule (300 mg total) by mouth 2 (two) times daily.          Where to get your medications    These are the prescriptions that you need to pick up. We sent them to a specific pharmacy, so you will need to go there to get them.   [id number] - [place] ([place]), [place] - [address]    [address] [place] ([place]) [place] [address]    Phone: [phone]        clindamycin 300 MG capsule           Follow up at Urgent Care if furuncle does not resolve  Dalissa Lovin 05/07/2012, 1:31 PM

## 2012-05-07 NOTE — MAU Note (Signed)
Had boil drained months ago ?  June at Urgent Care, was on antibiotics.  Has come back,  Keeps going up and down.  Currently down, but can still feel it there.  Has slight odor, brownish color.  Usually cycle is middle of month, had 2 last month ( 2nd and 29th) no period yet this month.

## 2012-05-07 NOTE — MAU Provider Note (Signed)
Attestation of Attending Supervision of Advanced Practitioner (CNM/NP): Evaluation and management procedures were performed by the Advanced Practitioner under my supervision and collaboration.  I have reviewed the Advanced Practitioner's note and chart, and I agree with the management and plan.  HARRAWAY-SMITH, Dreana Britz 7:43 PM     

## 2012-05-08 LAB — GC/CHLAMYDIA PROBE AMP, GENITAL
Chlamydia, DNA Probe: NEGATIVE
GC Probe Amp, Genital: NEGATIVE

## 2012-05-14 ENCOUNTER — Other Ambulatory Visit: Payer: Self-pay | Admitting: Advanced Practice Midwife

## 2012-05-14 DIAGNOSIS — N760 Acute vaginitis: Secondary | ICD-10-CM

## 2012-05-14 DIAGNOSIS — B9689 Other specified bacterial agents as the cause of diseases classified elsewhere: Secondary | ICD-10-CM

## 2012-05-14 MED ORDER — METRONIDAZOLE 500 MG PO TABS: 500.0000 mg | ORAL_TABLET | Freq: Two times a day (BID) | ORAL | Status: DC

## 2012-05-14 NOTE — Progress Notes (Signed)
  Pt called with concerns. Pt seen in MAU last week with BV and "boil" in right axilla. Pt states she has almost finished clindamycin and vaginal discharge is only a little better, still has odor, and boil had gotten a little better and is now getting worse/hurting more. Recommended f/u with PCP for eval of boil as this has been a chronic issue. Rx sent in for flagyl 500 mg bid x 7 days.

## 2012-06-30 ENCOUNTER — Encounter (HOSPITAL_COMMUNITY): Payer: Self-pay

## 2012-06-30 ENCOUNTER — Inpatient Hospital Stay (HOSPITAL_COMMUNITY)
Admission: AD | Admit: 2012-06-30 | Discharge: 2012-06-30 | Disposition: A | Discharge status: Home / Self Care | Payer: Self-pay | Source: Ambulatory Visit | Attending: Obstetrics & Gynecology | Admitting: Obstetrics & Gynecology

## 2012-06-30 DIAGNOSIS — B9689 Other specified bacterial agents as the cause of diseases classified elsewhere: Secondary | ICD-10-CM

## 2012-06-30 DIAGNOSIS — A499 Bacterial infection, unspecified: Secondary | ICD-10-CM | POA: Insufficient documentation

## 2012-06-30 DIAGNOSIS — N760 Acute vaginitis: Secondary | ICD-10-CM

## 2012-06-30 DIAGNOSIS — N949 Unspecified condition associated with female genital organs and menstrual cycle: Secondary | ICD-10-CM | POA: Insufficient documentation

## 2012-06-30 LAB — URINE MICROSCOPIC-ADD ON

## 2012-06-30 LAB — URINALYSIS, ROUTINE W REFLEX MICROSCOPIC
Bilirubin Urine: NEGATIVE
Glucose, UA: NEGATIVE mg/dL
Hgb urine dipstick: NEGATIVE
Ketones, ur: NEGATIVE mg/dL
Nitrite: NEGATIVE
Protein, ur: NEGATIVE mg/dL
Specific Gravity, Urine: 1.01 (ref 1.005–1.030)
Urobilinogen, UA: 0.2 mg/dL (ref 0.0–1.0)
pH: 7 (ref 5.0–8.0)

## 2012-06-30 LAB — WET PREP, GENITAL
Trich, Wet Prep: NONE SEEN
Yeast Wet Prep HPF POC: NONE SEEN

## 2012-06-30 LAB — POCT PREGNANCY, URINE: Preg Test, Ur: NEGATIVE

## 2012-06-30 MED ORDER — FLUCONAZOLE 150 MG PO TABS: 150.0000 mg | ORAL_TABLET | Freq: Once | ORAL | Status: DC

## 2012-06-30 MED ORDER — METRONIDAZOLE 500 MG PO TABS: 500.0000 mg | ORAL_TABLET | Freq: Two times a day (BID) | ORAL | Status: DC

## 2012-06-30 NOTE — MAU Note (Signed)
Discharge instructions were reviewed by Corky Downs PA Student.

## 2012-06-30 NOTE — MAU Provider Note (Signed)
History    CSN: [id number]  Arrival date and time: 06/30/12 1023   None    Chief Complaint  Patient presents with  . Vaginal Discharge  . Back Pain   HPI Patient is a 26 y.o. AAF presenting with vaginal discharge and irritation for 10 days. The discharge is watery and has a strong odor. No OTC treatment attempted. She states she gets yeast infections every month following her menses and after taking abx. She has 1 sexual partner and uses female condoms for birth control. She has a latex allergy and gets irritated after using condoms. She has been treated for BV several times in the past year. Denies N/V, abdominal pain. No recent abx use.   Past Medical History  Diagnosis Date  . Asthma   . Chlamydia   . UTI (lower urinary tract infection)   . Yeast infection   . Headache    Past Surgical History  Procedure Date  . Induced abortion     x 2  . Dilation and curettage of uterus    Family History  Problem Relation Age of Onset  . Diabetes Maternal Aunt   . Anesthesia problems Neg Hx   . Other Neg Hx   . Hearing loss Neg Hx    History  Substance Use Topics  . Smoking status: Never Smoker   . Smokeless tobacco: Never Used  . Alcohol Use: Yes     Comment: occasional alcohol   Allergies:  Allergies  Allergen Reactions  . Latex Itching    Burning   . Tylenol (Acetaminophen) Hives   No prescriptions prior to admission   Review of Systems  Constitutional: Negative for fever and chills.  Gastrointestinal: Negative for nausea, vomiting and abdominal pain.   Physical Exam   Blood pressure 112/83, pulse 83, temperature 98.8 F (37.1 C), temperature source Oral, resp. rate 18, last menstrual period 06/06/2012.  Physical Exam  Constitutional: She appears well-developed. No distress.  Cardiovascular: Normal rate, regular rhythm and normal heart sounds.   Respiratory: Effort normal and breath sounds normal. No respiratory distress.  GI: Soft. Bowel sounds are normal.  She exhibits no distension and no mass. There is no tenderness. There is no rebound and no guarding.  Genitourinary: Uterus is not enlarged and not tender. Cervix exhibits no motion tenderness and no discharge. Right adnexum displays no tenderness. Left adnexum displays no tenderness. Vaginal discharge found.  Vaginal discharge was frothy and malodorous Skin warm and dry.  Alert and oriented X 3.    Results for orders placed during the hospital encounter of 06/30/12 (from the past 24 hour(s))  URINALYSIS, ROUTINE W REFLEX MICROSCOPIC     Status: Abnormal   Collection Time   06/30/12 10:30 AM      Component Value Range   Color, Urine YELLOW  YELLOW   APPearance CLEAR  CLEAR   Specific Gravity, Urine 1.010  1.005 - 1.030   pH 7.0  5.0 - 8.0   Glucose, UA NEGATIVE  NEGATIVE mg/dL   Hgb urine dipstick NEGATIVE  NEGATIVE   Bilirubin Urine NEGATIVE  NEGATIVE   Ketones, ur NEGATIVE  NEGATIVE mg/dL   Protein, ur NEGATIVE  NEGATIVE mg/dL   Urobilinogen, UA 0.2  0.0 - 1.0 mg/dL   Nitrite NEGATIVE  NEGATIVE   Leukocytes, UA SMALL (*) NEGATIVE  URINE MICROSCOPIC-ADD ON     Status: Abnormal   Collection Time   06/30/12 10:30 AM      Component Value Range   Squamous  Epithelial / LPF FEW (*) RARE   WBC, UA 3-6  <3 WBC/hpf   RBC / HPF 0-2  <3 RBC/hpf   Bacteria, UA FEW (*) RARE  POCT PREGNANCY, URINE     Status: Normal   Collection Time   06/30/12 10:38 AM      Component Value Range   Preg Test, Ur NEGATIVE  NEGATIVE  WET PREP, GENITAL     Status: Abnormal   Collection Time   06/30/12 10:55 AM      Component Value Range   Yeast Wet Prep HPF POC NONE SEEN  NONE SEEN   Trich, Wet Prep NONE SEEN  NONE SEEN   Clue Cells Wet Prep HPF POC FEW (*) NONE SEEN   WBC, Wet Prep HPF POC FEW (*) NONE SEEN   MAU Course  Procedures   MDM  Assessment and Plan  A: Bacterial Vaginosis confirmed by wet prep     Vaginal discharge     GcChl sent to lab  P: Prescription for metronidazole 500 mg bid  x7 days. Instructed not to drink EtOH while taking flagyl and to refrain from sexual intercourse until treatment is complete. She has a history of yeast infections following abx use, provided prescription for diflucan 150 mg to take at completion of flagyl. Education provided on the difference between BV and yeast infection. Encouraged patient to use non-latex condoms consistently.  F/U with provider of choice if symptoms do not improve after completion of treatment.  Corky Downs 06/30/2012, 10:55 AM   I have reviewed the student's HPI, observed her physical exam, reviewed labs and reviewed plan of care and agree with her findings.

## 2012-06-30 NOTE — MAU Note (Signed)
Onset of vaginal discharge with an odor for about a week with back pain x 3 days, LMP 06/06/2012

## 2012-07-01 LAB — GC/CHLAMYDIA PROBE AMP, GENITAL
Chlamydia, DNA Probe: NEGATIVE
GC Probe Amp, Genital: NEGATIVE

## 2012-07-02 LAB — URINE CULTURE: Colony Count: >=100000

## 2012-07-03 ENCOUNTER — Telehealth: Payer: Self-pay | Admitting: Obstetrics and Gynecology

## 2012-07-03 NOTE — Telephone Encounter (Signed)
Urine culture 06/30/12 + for proteus: Per Dr. Marice Potter Rx Bactrim DS bid x 5d. Called pt and called in to [facility] pharmacy. She will pick up RX Shelly Gibbs, CNM 07/03/2012 6:59 PM

## 2012-07-13 ENCOUNTER — Other Ambulatory Visit: Payer: Self-pay | Admitting: Advanced Practice Midwife

## 2012-07-13 MED ORDER — FLUCONAZOLE 150 MG PO TABS: 150.0000 mg | ORAL_TABLET | Freq: Once | ORAL | Status: DC

## 2012-07-13 MED ORDER — SULFAMETHOXAZOLE-TRIMETHOPRIM 800-160 MG PO TABS: 1.0000 | ORAL_TABLET | Freq: Two times a day (BID) | ORAL | Status: DC

## 2012-07-13 NOTE — Progress Notes (Signed)
Pt called asking that rx for Bactrim for UTI be sent to pharmacy again. States she didn't pick it up and pharmacy doesn't have prescription now. Rx sent for Bactrim DS i po bid x 5 days.

## 2012-07-30 ENCOUNTER — Encounter (HOSPITAL_COMMUNITY): Payer: Self-pay

## 2012-07-30 ENCOUNTER — Inpatient Hospital Stay (HOSPITAL_COMMUNITY)
Admission: AD | Admit: 2012-07-30 | Discharge: 2012-07-30 | Disposition: A | Discharge status: Home / Self Care | Payer: Self-pay | Source: Ambulatory Visit | Attending: Obstetrics & Gynecology | Admitting: Obstetrics & Gynecology

## 2012-07-30 DIAGNOSIS — N949 Unspecified condition associated with female genital organs and menstrual cycle: Secondary | ICD-10-CM | POA: Insufficient documentation

## 2012-07-30 DIAGNOSIS — A499 Bacterial infection, unspecified: Secondary | ICD-10-CM | POA: Insufficient documentation

## 2012-07-30 DIAGNOSIS — B9689 Other specified bacterial agents as the cause of diseases classified elsewhere: Secondary | ICD-10-CM | POA: Insufficient documentation

## 2012-07-30 DIAGNOSIS — L0100 Impetigo, unspecified: Secondary | ICD-10-CM | POA: Diagnosis present

## 2012-07-30 DIAGNOSIS — N760 Acute vaginitis: Secondary | ICD-10-CM | POA: Insufficient documentation

## 2012-07-30 HISTORY — DX: Acute vaginitis: B96.89

## 2012-07-30 HISTORY — DX: Acute vaginitis: N76.0

## 2012-07-30 LAB — URINALYSIS, ROUTINE W REFLEX MICROSCOPIC
Bilirubin Urine: NEGATIVE
Glucose, UA: NEGATIVE mg/dL
Hgb urine dipstick: NEGATIVE
Ketones, ur: NEGATIVE mg/dL
Nitrite: NEGATIVE
Protein, ur: NEGATIVE mg/dL
Specific Gravity, Urine: 1.01 (ref 1.005–1.030)
Urobilinogen, UA: 0.2 mg/dL (ref 0.0–1.0)
pH: 6 (ref 5.0–8.0)

## 2012-07-30 LAB — WET PREP, GENITAL
Trich, Wet Prep: NONE SEEN
Yeast Wet Prep HPF POC: NONE SEEN

## 2012-07-30 LAB — URINE MICROSCOPIC-ADD ON

## 2012-07-30 LAB — POCT PREGNANCY, URINE: Preg Test, Ur: NEGATIVE

## 2012-07-30 MED ORDER — SULFAMETHOXAZOLE-TRIMETHOPRIM 800-160 MG PO TABS: 1.0000 | ORAL_TABLET | Freq: Two times a day (BID) | ORAL | Status: DC

## 2012-07-30 MED ORDER — CLINDAMYCIN HCL 300 MG PO CAPS: 300.0000 mg | ORAL_CAPSULE | Freq: Two times a day (BID) | ORAL | Status: DC

## 2012-07-30 NOTE — MAU Note (Signed)
Patient is in with c/o vaginal irritation and brown odorous discharge. Patient states that she completed antibiotics for UTI, BV and yeast treatment a week and a half. She is also concerned of a half pea size fluid filled blister on her left shin (concerned if its herpes). She denies any vaginal bleeding.

## 2012-07-30 NOTE — MAU Provider Note (Signed)
Chief Complaint: Vaginal Discharge   First Provider Initiated Contact with Patient 07/30/12 1121     SUBJECTIVE HPI: Shelly Gibbs is a 26 y.o. [id number] who presents to maternity admissions reporting vaginal discharge. She was treated 1.5 weeks ago for BV and UTI and completed her treatment but is still having discharge with odor.  She also has 2 blisters on her legs that are itching.  She denies vaginal bleeding, urinary symptoms, h/a, dizziness, n/v, or fever/chills.     Past Medical History  Diagnosis Date  . Asthma   . Chlamydia   . UTI (lower urinary tract infection)   . Yeast infection   . Headache   . BV (bacterial vaginosis)    Past Surgical History  Procedure Date  . Induced abortion     x 2  . Dilation and curettage of uterus    History   Social History  . Marital Status: Single    Spouse Name: N/A    Number of Children: N/A  . Years of Education: N/A   Occupational History  . Not on file.   Social History Main Topics  . Smoking status: Never Smoker   . Smokeless tobacco: Never Used  . Alcohol Use: Yes     Comment: occasional alcohol  . Drug Use: No  . Sexually Active: Yes    Birth Control/ Protection: Condom   Other Topics Concern  . Not on file   Social History Narrative  . No narrative on file   No current facility-administered medications on file prior to encounter.   Current Outpatient Prescriptions on File Prior to Encounter  Medication Sig Dispense Refill  . fluconazole (DIFLUCAN) 150 MG tablet Take 1 tablet (150 mg total) by mouth once.  1 tablet  0  . metroNIDAZOLE (FLAGYL) 500 MG tablet Take 1 tablet (500 mg total) by mouth 2 (two) times daily.  14 tablet  0  . sulfamethoxazole-trimethoprim (BACTRIM DS,SEPTRA DS) 800-160 MG per tablet Take 1 tablet by mouth 2 (two) times daily.  10 tablet  0   Allergies  Allergen Reactions  . Latex Itching    Burning   . Tylenol (Acetaminophen) Hives    ROS: Pertinent items in  HPI  OBJECTIVE Last menstrual period 07/04/2012. GENERAL: Well-developed, well-nourished female in no acute distress.  HEENT: Normocephalic HEART: normal rate RESP: normal effort ABDOMEN: Soft, non-tender EXTREMITIES: Nontender, no edema, Pt has one raised, fluid filled lesion ~0.5cm in size on right inner thigh with mild erythema surrounding it and one similar sized lesion on left shin that has a yellow crust over it  NEURO: Alert and oriented Pelvic exam: Cervix pink, visually closed, without lesion, moderate amount clear thin discharge with odor, vaginal walls and external genitalia normal Bimanual exam: Cervix 0/long/high, firm, anterior, neg CMT, uterus nontender, nonenlarged, adnexa without tenderness, enlargement, or mass  LAB RESULTS Results for orders placed during the hospital encounter of 07/30/12 (from the past 24 hour(s))  URINALYSIS, ROUTINE W REFLEX MICROSCOPIC     Status: Abnormal   Collection Time   07/30/12  9:52 AM      Component Value Range   Color, Urine YELLOW  YELLOW   APPearance CLEAR  CLEAR   Specific Gravity, Urine 1.010  1.005 - 1.030   pH 6.0  5.0 - 8.0   Glucose, UA NEGATIVE  NEGATIVE mg/dL   Hgb urine dipstick NEGATIVE  NEGATIVE   Bilirubin Urine NEGATIVE  NEGATIVE   Ketones, ur NEGATIVE  NEGATIVE mg/dL  Protein, ur NEGATIVE  NEGATIVE mg/dL   Urobilinogen, UA 0.2  0.0 - 1.0 mg/dL   Nitrite NEGATIVE  NEGATIVE   Leukocytes, UA TRACE (*) NEGATIVE  URINE MICROSCOPIC-ADD ON     Status: Normal   Collection Time   07/30/12  9:52 AM      Component Value Range   Squamous Epithelial / LPF RARE  RARE   WBC, UA 0-2  <3 WBC/hpf   RBC / HPF 0-2  <3 RBC/hpf   Bacteria, UA RARE  RARE  POCT PREGNANCY, URINE     Status: Normal   Collection Time   07/30/12  9:57 AM      Component Value Range   Preg Test, Ur NEGATIVE  NEGATIVE     ASSESSMENT 1. Bacterial vaginosis   2. Impetigo     PLAN Discharge home Flagyl 500 mg BID x7 days Bactrim DS BID x7  days F/U with gyn provider Return to MAU if symptoms worsen or persist    Medication List     As of 08/01/2012  1:59 PM    STOP taking these medications         fluconazole 150 MG tablet   Commonly known as: DIFLUCAN      metroNIDAZOLE 500 MG tablet   Commonly known as: FLAGYL      TAKE these medications         clindamycin 300 MG capsule   Commonly known as: CLEOCIN   Take 1 capsule (300 mg total) by mouth 2 (two) times daily.      NYQUIL COLD & FLU PO   Take 2 tablets by mouth at bedtime as needed.      sulfamethoxazole-trimethoprim 800-160 MG per tablet   Commonly known as: BACTRIM DS,SEPTRA DS   Take 1 tablet by mouth 2 (two) times daily.           Medication List     As of 07/30/2012 11:23 AM    ASK your doctor about these medications         fluconazole 150 MG tablet   Commonly known as: DIFLUCAN   Take 1 tablet (150 mg total) by mouth once.      metroNIDAZOLE 500 MG tablet   Commonly known as: FLAGYL   Take 1 tablet (500 mg total) by mouth 2 (two) times daily.      NYQUIL COLD & FLU PO   Take 2 tablets by mouth at bedtime as needed.      sulfamethoxazole-trimethoprim 800-160 MG per tablet   Commonly known as: BACTRIM DS,SEPTRA DS   Take 1 tablet by mouth 2 (two) times daily.         Sharen Counter Certified Nurse-Midwife 07/30/2012  11:23 AM

## 2012-07-30 NOTE — MAU Note (Signed)
Was treated for UTI and BV, also took  Med for yeast, still having discharge, kind of light tan. ? Lesions on lower extremities, first has itching - then a ? Blister appears, one is in blister like state, one is open.

## 2012-07-31 LAB — URINE CULTURE: Colony Count: 40000

## 2012-07-31 LAB — GC/CHLAMYDIA PROBE AMP
CT Probe RNA: NEGATIVE
GC Probe RNA: POSITIVE — AB

## 2012-08-03 DIAGNOSIS — A549 Gonococcal infection, unspecified: Secondary | ICD-10-CM

## 2012-08-03 HISTORY — DX: Gonococcal infection, unspecified: A54.9

## 2012-08-06 ENCOUNTER — Emergency Department (HOSPITAL_COMMUNITY)
Admission: EM | Admit: 2012-08-06 | Discharge: 2012-08-06 | Disposition: A | Discharge status: Home / Self Care | Payer: Self-pay | Attending: Emergency Medicine | Admitting: Emergency Medicine

## 2012-08-06 ENCOUNTER — Encounter (HOSPITAL_COMMUNITY): Payer: Self-pay

## 2012-08-06 DIAGNOSIS — L02412 Cutaneous abscess of left axilla: Secondary | ICD-10-CM

## 2012-08-06 DIAGNOSIS — Z8742 Personal history of other diseases of the female genital tract: Secondary | ICD-10-CM | POA: Insufficient documentation

## 2012-08-06 DIAGNOSIS — Z8679 Personal history of other diseases of the circulatory system: Secondary | ICD-10-CM | POA: Insufficient documentation

## 2012-08-06 DIAGNOSIS — Z872 Personal history of diseases of the skin and subcutaneous tissue: Secondary | ICD-10-CM | POA: Insufficient documentation

## 2012-08-06 DIAGNOSIS — Z8709 Personal history of other diseases of the respiratory system: Secondary | ICD-10-CM | POA: Insufficient documentation

## 2012-08-06 DIAGNOSIS — Z87440 Personal history of urinary (tract) infections: Secondary | ICD-10-CM | POA: Insufficient documentation

## 2012-08-06 DIAGNOSIS — IMO0002 Reserved for concepts with insufficient information to code with codable children: Secondary | ICD-10-CM | POA: Insufficient documentation

## 2012-08-06 MED ORDER — SULFAMETHOXAZOLE-TRIMETHOPRIM 800-160 MG PO TABS: 1.0000 | ORAL_TABLET | Freq: Two times a day (BID) | ORAL | Status: DC

## 2012-08-06 MED ORDER — CEPHALEXIN 500 MG PO CAPS: 500.0000 mg | ORAL_CAPSULE | Freq: Four times a day (QID) | ORAL | Status: DC

## 2012-08-06 MED ORDER — LIDOCAINE-EPINEPHRINE 2 %-1:100000 IJ SOLN
20.0000 mL | Freq: Once | INTRAMUSCULAR | Status: AC
  Administered 2012-08-06: 20 mL via INTRADERMAL

## 2012-08-06 MED ORDER — TRAMADOL HCL 50 MG PO TABS: 50.0000 mg | ORAL_TABLET | Freq: Four times a day (QID) | ORAL | Status: DC | PRN

## 2012-08-06 NOTE — ED Provider Notes (Signed)
History   This chart was scribed for Shelly Crumble, PA by Shelly Gibbs, ED Scribe. This patient was seen in room WTR8/WTR8 and the patient's care was started at 1510.   CSN: [id number]  Arrival date & time 08/06/12  1324   First MD Initiated Contact with Patient 08/06/12 1510      Chief Complaint  Patient presents with  . Recurrent Skin Infections     The history is provided by the patient. No language interpreter was used.    Shelly Gibbs is a 26 y.o. female with a h/o skin infections who presents to the Emergency Department complaining of a new, gradually worsening boil on the left armpit starting 2 weeks ago. She reports using hot compresses on the area with no relief. She denies fever, chills, headaches.   Pt reports trying hot compress with no relief and denies taking any OTC medication for the pain. Past Medical History  Diagnosis Date  . Asthma   . Chlamydia   . UTI (lower urinary tract infection)   . Yeast infection   . Headache   . BV (bacterial vaginosis)     Past Surgical History  Procedure Date  . Induced abortion     x 2  . Dilation and curettage of uterus     Family History  Problem Relation Age of Onset  . Diabetes Maternal Aunt   . Anesthesia problems Neg Hx   . Other Neg Hx   . Hearing loss Neg Hx     History  Substance Use Topics  . Smoking status: Never Smoker   . Smokeless tobacco: Never Used  . Alcohol Use: Yes     Comment: occasional alcohol    OB History    Grav Para Term Preterm Abortions TAB SAB Ect Mult Living   6 1 1  0 5 4 1  0 0 1      Review of Systems  Constitutional: Negative.  Negative for fever and chills.  HENT: Negative.   Respiratory: Negative.   Cardiovascular: Negative.   Gastrointestinal: Negative.   Musculoskeletal: Negative.   Skin: Positive for wound.  Neurological: Negative.  Negative for headaches.  Hematological: Negative.   Psychiatric/Behavioral: Negative.   All other systems reviewed and  are negative.    Allergies  Latex and Tylenol  Home Medications   Current Outpatient Rx  Name  Route  Sig  Dispense  Refill  . CLINDAMYCIN HCL 300 MG PO CAPS   Oral   Take 300 mg by mouth 2 (two) times daily. For 10 days.         . SULFAMETHOXAZOLE-TRIMETHOPRIM 800-160 MG PO TABS   Oral   Take 1 tablet by mouth 2 (two) times daily. For 7 days started on 12-19         . NYQUIL COLD & FLU PO   Oral   Take 2 tablets by mouth at bedtime as needed.           BP 129/87  Pulse 90  Temp 98.4 F (36.9 C) (Oral)  Resp 18  SpO2 100%  LMP 07/04/2012  Physical Exam  Nursing note and vitals reviewed. Constitutional: She is oriented to person, place, and time. She appears well-developed and well-nourished. No distress.  HENT:  Head: Normocephalic and atraumatic.  Eyes: EOM are normal.  Neck: Neck supple. No tracheal deviation present.  Cardiovascular: Normal rate, regular rhythm and normal heart sounds.   Pulmonary/Chest: Effort normal and breath sounds normal. No respiratory distress.  Musculoskeletal:  Normal range of motion.  Neurological: She is alert and oriented to person, place, and time.  Skin: Skin is warm and dry.       Tender, fluctuant 4 cm abscess to the left armpit with no surrounding erythema or cellulitis.   Psychiatric: She has a normal mood and affect. Her behavior is normal.    ED Course  Procedures (including critical care time)  INCISION AND DRAINAGE Performed by: Shelly Gibbs A Consent: Verbal consent obtained. Risks and benefits: risks, benefits and alternatives were discussed Type: abscess  Body area: left axilla  Anesthesia: local infiltration  Incision was made with a scalpel.  Local anesthetic: lidocaine 2% w/ epinephrine  Anesthetic total: 3 ml  Complexity: complex Blunt dissection to break up loculations  Drainage: purulent  Drainage amount: large  Packing material: 1/4 in iodoform gauze  Patient tolerance: Patient  tolerated the procedure well with no immediate complications.     DIAGNOSTIC STUDIES: Oxygen Saturation is 100% on room air, normal by my interpretation.    COORDINATION OF CARE:  3:16 PM Discussed treatment plan which includes I & D with pt at bedside and pt agreed to plan.    Labs Reviewed - No data to display No results found.   1. Abscess of axilla, left       MDM  Pt with left axilla recurrent cellulitis. I&D performed. Tolerated well. No fever. No malaise. D/c home with antibiotics for recurrent infection and follow up 2 days.    I personally performed the services described in this documentation, which was scribed in my presence. The recorded information has been reviewed and is accurate.    Shelly Mussel, PA 08/06/12 1545

## 2012-08-06 NOTE — ED Notes (Signed)
Pt c/o boil x2wks with odor, no drainage

## 2012-08-07 NOTE — ED Provider Notes (Signed)
Medical screening examination/treatment/procedure(s) were performed by non-physician practitioner and as supervising physician I was immediately available for consultation/collaboration.   Tifanny Dollens M Hanne Kegg, MD 08/07/12 1531 

## 2012-08-20 ENCOUNTER — Inpatient Hospital Stay (HOSPITAL_COMMUNITY): Payer: Self-pay

## 2012-08-20 ENCOUNTER — Inpatient Hospital Stay (HOSPITAL_COMMUNITY)
Admission: AD | Admit: 2012-08-20 | Discharge: 2012-08-20 | Disposition: A | Discharge status: Home / Self Care | Payer: Self-pay | Source: Ambulatory Visit | Attending: Obstetrics & Gynecology | Admitting: Obstetrics & Gynecology

## 2012-08-20 ENCOUNTER — Encounter (HOSPITAL_COMMUNITY): Payer: Self-pay | Admitting: *Deleted

## 2012-08-20 DIAGNOSIS — O2390 Unspecified genitourinary tract infection in pregnancy, unspecified trimester: Secondary | ICD-10-CM | POA: Insufficient documentation

## 2012-08-20 DIAGNOSIS — A499 Bacterial infection, unspecified: Secondary | ICD-10-CM | POA: Insufficient documentation

## 2012-08-20 DIAGNOSIS — N760 Acute vaginitis: Secondary | ICD-10-CM

## 2012-08-20 DIAGNOSIS — B9689 Other specified bacterial agents as the cause of diseases classified elsewhere: Secondary | ICD-10-CM

## 2012-08-20 DIAGNOSIS — O219 Vomiting of pregnancy, unspecified: Secondary | ICD-10-CM

## 2012-08-20 DIAGNOSIS — O210 Mild hyperemesis gravidarum: Secondary | ICD-10-CM | POA: Insufficient documentation

## 2012-08-20 HISTORY — DX: Gonococcal infection, unspecified: A54.9

## 2012-08-20 LAB — URINALYSIS, ROUTINE W REFLEX MICROSCOPIC
Bilirubin Urine: NEGATIVE
Glucose, UA: NEGATIVE mg/dL
Hgb urine dipstick: NEGATIVE
Ketones, ur: NEGATIVE mg/dL
Leukocytes, UA: NEGATIVE
Nitrite: NEGATIVE
Protein, ur: NEGATIVE mg/dL
Specific Gravity, Urine: 1.015 (ref 1.005–1.030)
Urobilinogen, UA: 0.2 mg/dL (ref 0.0–1.0)
pH: 7 (ref 5.0–8.0)

## 2012-08-20 LAB — CBC
HCT: 38.1 % (ref 36.0–46.0)
Hemoglobin: 12.7 g/dL (ref 12.0–15.0)
MCH: 24.7 pg — ABNORMAL LOW (ref 26.0–34.0)
MCHC: 33.3 g/dL (ref 30.0–36.0)
MCV: 74.1 fL — ABNORMAL LOW (ref 78.0–100.0)
Platelets: 326 10*3/uL (ref 150–400)
RBC: 5.14 MIL/uL — ABNORMAL HIGH (ref 3.87–5.11)
RDW: 14.2 % (ref 11.5–15.5)
WBC: 7.6 10*3/uL (ref 4.0–10.5)

## 2012-08-20 LAB — WET PREP, GENITAL
Trich, Wet Prep: NONE SEEN
Yeast Wet Prep HPF POC: NONE SEEN

## 2012-08-20 LAB — POCT PREGNANCY, URINE: Preg Test, Ur: POSITIVE — AB

## 2012-08-20 LAB — HCG, QUANTITATIVE, PREGNANCY: hCG, Beta Chain, Quant, S: 56986 m[IU]/mL — ABNORMAL HIGH (ref <5)

## 2012-08-20 MED ORDER — METRONIDAZOLE 500 MG PO TABS: 500.0000 mg | ORAL_TABLET | Freq: Two times a day (BID) | ORAL | Status: DC

## 2012-08-20 MED ORDER — ONDANSETRON 8 MG PO TBDP
8.0000 mg | ORAL_TABLET | Freq: Once | ORAL | Status: AC
  Administered 2012-08-20: 8 mg via ORAL
  Filled 2012-08-20: qty 1

## 2012-08-20 MED ORDER — ONDANSETRON 8 MG PO TBDP: 8.0000 mg | ORAL_TABLET | Freq: Three times a day (TID) | ORAL | Status: DC | PRN

## 2012-08-20 MED ORDER — PROMETHAZINE HCL 25 MG PO TABS: 25.0000 mg | ORAL_TABLET | Freq: Four times a day (QID) | ORAL | Status: DC | PRN

## 2012-08-20 NOTE — MAU Provider Note (Signed)
History     CSN: [id number]  Arrival date and time: 08/20/12 1138   First Provider Initiated Contact with Patient 08/20/12 1309      Chief Complaint  Patient presents with  . Vaginal Bleeding  . Nausea   HPI  Pt is [redacted]w[redacted]d pregnant with onset vomiting for 4 days and spitting for 2 to 3 weeks.  She has been spotting since Sat and vomiting since Monday.  She can tolerate foods for about 10 or 15 minutes before vomiting.  She was treated for GC on 12/23 and BV but couldn't keep medication down for the BV.  She thinks she still has BV.  She has some cramping on right lower quadrant that comes and goes.  She has not had a bowel movement since last week.  Past Medical History  Diagnosis Date  . Asthma   . Chlamydia   . UTI (lower urinary tract infection)   . Yeast infection   . Headache   . BV (bacterial vaginosis)   . Gonorrhea 08-03-2012    Past Surgical History  Procedure Date  . Induced abortion     x 2  . Dilation and curettage of uterus     Family History  Problem Relation Age of Onset  . Diabetes Maternal Aunt   . Anesthesia problems Neg Hx   . Other Neg Hx   . Hearing loss Neg Hx     History  Substance Use Topics  . Smoking status: Never Smoker   . Smokeless tobacco: Never Used  . Alcohol Use: Yes     Comment: occasional alcohol    Allergies:  Allergies  Allergen Reactions  . Latex Itching    Burning   . Tylenol (Acetaminophen) Hives    Prescriptions prior to admission  Medication Sig Dispense Refill  . cephALEXin (KEFLEX) 500 MG capsule Take 1 capsule (500 mg total) by mouth 4 (four) times daily.  28 capsule  0  . clindamycin (CLEOCIN) 300 MG capsule Take 300 mg by mouth 2 (two) times daily. For 10 days.      Marland Kitchen DM-Doxylamine-Acetaminophen (NYQUIL COLD & FLU PO) Take 2 tablets by mouth at bedtime as needed.      . sulfamethoxazole-trimethoprim (BACTRIM DS,SEPTRA DS) 800-160 MG per tablet Take 1 tablet by mouth 2 (two) times daily. For 7 days started on  12-19      . sulfamethoxazole-trimethoprim (SEPTRA DS) 800-160 MG per tablet Take 1 tablet by mouth 2 (two) times daily.  14 tablet  0  . traMADol (ULTRAM) 50 MG tablet Take 1 tablet (50 mg total) by mouth every 6 (six) hours as needed for pain.  15 tablet  0    Review of Systems  Constitutional: Negative for fever and chills.  Gastrointestinal: Positive for nausea, abdominal pain and constipation. Negative for diarrhea.  Genitourinary: Negative for dysuria and urgency.  Neurological: Negative for headaches.   Physical Exam   Blood pressure 120/75, pulse 78, temperature 98.8 F (37.1 C), temperature source Oral, resp. rate 18, height 5\' 2"  (1.575 m), weight 159 lb (72.122 kg), last menstrual period 07/04/2012.  Physical Exam  Nursing note and vitals reviewed. Constitutional: She is oriented to person, place, and time. She appears well-developed and well-nourished.  HENT:  Head: Normocephalic.  Eyes: Pupils are equal, round, and reactive to light.  Neck: Normal range of motion. Neck supple.  Cardiovascular: Normal rate.   Respiratory: Effort normal.  GI: Soft. She exhibits no distension. There is no tenderness. There is  no rebound and no guarding.  Genitourinary:       Small amount of malodorous pink tinged discharge in vault; cervix parous, NT, utuers NSSC minimally tender;adnexa without palpable enlargement or tenderness  Musculoskeletal: Normal range of motion.  Neurological: She is alert and oriented to person, place, and time.  Skin: Skin is warm.  Psychiatric: She has a normal mood and affect.    MAU Course  Procedures   Results for orders placed during the hospital encounter of 08/20/12 (from the past 24 hour(s))  URINALYSIS, ROUTINE W REFLEX MICROSCOPIC     Status: Normal   Collection Time   08/20/12 12:05 PM      Component Value Range   Color, Urine YELLOW  YELLOW   APPearance CLEAR  CLEAR   Specific Gravity, Urine 1.015  1.005 - 1.030   pH 7.0  5.0 - 8.0    Glucose, UA NEGATIVE  NEGATIVE mg/dL   Hgb urine dipstick NEGATIVE  NEGATIVE   Bilirubin Urine NEGATIVE  NEGATIVE   Ketones, ur NEGATIVE  NEGATIVE mg/dL   Protein, ur NEGATIVE  NEGATIVE mg/dL   Urobilinogen, UA 0.2  0.0 - 1.0 mg/dL   Nitrite NEGATIVE  NEGATIVE   Leukocytes, UA NEGATIVE  NEGATIVE  POCT PREGNANCY, URINE     Status: Abnormal   Collection Time   08/20/12 12:09 PM      Component Value Range   Preg Test, Ur POSITIVE (*) NEGATIVE  HCG, QUANTITATIVE, PREGNANCY     Status: Abnormal   Collection Time   08/20/12  1:14 PM      Component Value Range   hCG, Beta Chain, Sharene Butters, S [id number] (*) <5 mIU/mL  CBC     Status: Abnormal   Collection Time   08/20/12  1:20 PM      Component Value Range   WBC 7.6  4.0 - 10.5 K/uL   RBC 5.14 (*) 3.87 - 5.11 MIL/uL   Hemoglobin 12.7  12.0 - 15.0 g/dL   HCT 84.1  32.4 - 40.1 %   MCV 74.1 (*) 78.0 - 100.0 fL   MCH 24.7 (*) 26.0 - 34.0 pg   MCHC 33.3  30.0 - 36.0 g/dL   RDW 02.7  25.3 - 66.4 %   Platelets 326  150 - 400 K/uL  WET PREP, GENITAL     Status: Abnormal   Collection Time   08/20/12  2:30 PM      Component Value Range   Yeast Wet Prep HPF POC NONE SEEN  NONE SEEN   Trich, Wet Prep NONE SEEN  NONE SEEN   Clue Cells Wet Prep HPF POC MODERATE (*) NONE SEEN   WBC, Wet Prep HPF POC FEW (*) NONE SEEN    Assessment and Plan  Viable IUP [redacted]w[redacted]d- pursue OB care- confirmation of pregnancy letter given BV- prescription for Flagyl 500mg  BID for 7 days sent to pharmacy Nausea and vomiting in pregnancy- prescription for Phenergan tablets and Zofran  [place] Marney 08/20/2012, 1:16 PM

## 2012-08-20 NOTE — MAU Note (Signed)
Had pos HPT on 12/29, has been throwing up like crazy.   Was dx and treated for gonorrhea, also given something to treat BV.  Discharge never went away, has not had intercourse since treated (12/23).  Started bleeding on 01/04, discharge is brownish now.

## 2012-08-21 LAB — GC/CHLAMYDIA PROBE AMP
CT Probe RNA: NEGATIVE
GC Probe RNA: NEGATIVE

## 2012-08-22 ENCOUNTER — Other Ambulatory Visit: Payer: Self-pay | Admitting: Advanced Practice Midwife

## 2012-08-22 MED ORDER — METRONIDAZOLE 0.75 % VA GEL: 1.0000 | Freq: Two times a day (BID) | VAGINAL | Status: DC

## 2012-08-22 NOTE — Progress Notes (Signed)
Patient ID: Shelly Gibbs, female   DOB: [birth year], 27 y.o.   MRN: [id number] Pt called requesting rx for metrogel - was prescribed flagyl, but can't keep it down. Rx sent to pharmacy, pt aware.

## 2012-08-29 NOTE — MAU Provider Note (Signed)
Attestation of Attending Supervision of Advanced Practitioner (CNM/NP): Evaluation and management procedures were performed by the Advanced Practitioner under my supervision and collaboration. I have reviewed the Advanced Practitioner's note and chart, and I agree with the management and plan.  Dessiree Sze H. 12:01 PM   

## 2012-08-30 ENCOUNTER — Inpatient Hospital Stay (HOSPITAL_COMMUNITY)
Admission: AD | Admit: 2012-08-30 | Discharge: 2012-08-30 | Disposition: A | Discharge status: Home / Self Care | Payer: Self-pay | Source: Ambulatory Visit | Attending: Obstetrics and Gynecology | Admitting: Obstetrics and Gynecology

## 2012-08-30 ENCOUNTER — Encounter (HOSPITAL_COMMUNITY): Payer: Self-pay | Admitting: Family

## 2012-08-30 ENCOUNTER — Inpatient Hospital Stay (HOSPITAL_COMMUNITY): Payer: Self-pay

## 2012-08-30 DIAGNOSIS — O209 Hemorrhage in early pregnancy, unspecified: Secondary | ICD-10-CM | POA: Insufficient documentation

## 2012-08-30 DIAGNOSIS — R109 Unspecified abdominal pain: Secondary | ICD-10-CM | POA: Insufficient documentation

## 2012-08-30 DIAGNOSIS — O418X90 Other specified disorders of amniotic fluid and membranes, unspecified trimester, not applicable or unspecified: Secondary | ICD-10-CM

## 2012-08-30 LAB — URINALYSIS, ROUTINE W REFLEX MICROSCOPIC
Bilirubin Urine: NEGATIVE
Glucose, UA: NEGATIVE mg/dL
Ketones, ur: 15 mg/dL — AB
Leukocytes, UA: NEGATIVE
Nitrite: NEGATIVE
Protein, ur: NEGATIVE mg/dL
Specific Gravity, Urine: 1.02 (ref 1.005–1.030)
Urobilinogen, UA: 1 mg/dL (ref 0.0–1.0)
pH: 6.5 (ref 5.0–8.0)

## 2012-08-30 LAB — WET PREP, GENITAL
Clue Cells Wet Prep HPF POC: NONE SEEN
Trich, Wet Prep: NONE SEEN
Yeast Wet Prep HPF POC: NONE SEEN

## 2012-08-30 LAB — URINE MICROSCOPIC-ADD ON

## 2012-08-30 NOTE — MAU Note (Signed)
Pt states last pm began having lower abd pain and back pain, sharp cramping that radiated down. Began having dark blood then, today noted red blood and few small clots when wiping after voiding. Pain is intermittent. Denies abnormal vag d/c changes prior.

## 2012-08-30 NOTE — MAU Note (Signed)
Patient reports lower abdominal pain that began at 1900 last night; she noticed brown small clotting after wiping last night. At 1730 today, noticed red blood in toilet and when wiping. Cramping continues.

## 2012-08-30 NOTE — MAU Provider Note (Signed)
History     CSN: [id number]  Arrival date and time: 08/30/12 1811   First Provider Initiated Contact with Patient 08/30/12 1850      Chief Complaint  Patient presents with  . Abdominal Pain  . Vaginal Bleeding   HPI Pt is [redacted]week pregnant and presents with lower abdominal pain and bleeding.  Pt had dark brown spotting last night and bright red spotting today.  Her cramping has increased today.  She has not had intercourse recently.  She denies UTI symptoms but has had constipation. Pt has hx of gonorrhea 1 month ago.  On 08/20/12 pt was seen in MAU with neg GC- pt was treated for BV with Flagyl tablets, which pt says she could not keep down.  She then got a prescription for Metrogel, which she has used. OB History    Grav Para Term Preterm Abortions TAB SAB Ect Mult Living   7 1 1  0 5 4 1  0 0 1      Past Medical History  Diagnosis Date  . Asthma   . Chlamydia   . UTI (lower urinary tract infection)   . Yeast infection   . Headache   . BV (bacterial vaginosis)   . Gonorrhea 08-03-2012    Past Surgical History  Procedure Date  . Induced abortion     x 2  . Dilation and curettage of uterus     Family History  Problem Relation Age of Onset  . Diabetes Maternal Aunt   . Anesthesia problems Neg Hx   . Other Neg Hx   . Hearing loss Neg Hx     History  Substance Use Topics  . Smoking status: Never Smoker   . Smokeless tobacco: Never Used  . Alcohol Use: Yes     Comment: occasional alcohol    Allergies:  Allergies  Allergen Reactions  . Latex Itching    Burning   . Tylenol (Acetaminophen) Hives    Prescriptions prior to admission  Medication Sig Dispense Refill  . metroNIDAZOLE (FLAGYL) 500 MG tablet Take 1 tablet (500 mg total) by mouth 2 (two) times daily.  14 tablet  0  . metroNIDAZOLE (METROGEL VAGINAL) 0.75 % vaginal gel Place 1 Applicatorful vaginally 2 (two) times daily.  70 g  0  . ondansetron (ZOFRAN ODT) 8 MG disintegrating tablet Take 1 tablet (8 mg  total) by mouth every 8 (eight) hours as needed for nausea.  20 tablet  0  . promethazine (PHENERGAN) 25 MG tablet Take 1 tablet (25 mg total) by mouth every 6 (six) hours as needed for nausea.  30 tablet  0    Review of Systems  Constitutional: Negative for fever and chills.  Gastrointestinal: Positive for abdominal pain.       Spitting   Physical Exam   Blood pressure 118/80, pulse 78, temperature 98.3 F (36.8 C), temperature source Oral, resp. rate 20, height 5\' 2"  (1.575 m), weight 157 lb 2 oz (71.271 kg), last menstrual period 07/04/2012.  Physical Exam  Nursing note and vitals reviewed. Constitutional: She is oriented to person, place, and time. She appears well-developed and well-nourished.  HENT:  Head: Normocephalic.       spitting  Eyes: Pupils are equal, round, and reactive to light.  Neck: Normal range of motion. Neck supple.  Cardiovascular: Normal rate.   Respiratory: Effort normal.  GI: Soft. She exhibits no distension. There is tenderness. There is no rebound and no guarding.  Genitourinary:  Mod amount of opaque pink discharge in vault with ?metrogel. Cervix tender; uterus~  8 week size ; adnexa without palpable enlargement- mildly tender  Musculoskeletal: Normal range of motion.  Neurological: She is alert and oriented to person, place, and time.  Skin: Skin is warm and dry.  Psychiatric: She has a normal mood and affect.    MAU Course  Procedures Results for orders placed during the hospital encounter of 08/30/12 (from the past 24 hour(s))  WET PREP, GENITAL     Status: Abnormal   Collection Time   08/30/12  7:05 PM      Component Value Range   Yeast Wet Prep HPF POC NONE SEEN  NONE SEEN   Trich, Wet Prep NONE SEEN  NONE SEEN   Clue Cells Wet Prep HPF POC NONE SEEN  NONE SEEN   WBC, Wet Prep HPF POC FEW (*) NONE SEEN  care turned over to [employer]    LINEBERRY,SUSAN 08/30/2012, 6:56 PM   2000 - Patient in [place]. Care  assumed from Pamelia Hoit, NP   [place] Results *RADIOLOGY REPORT*   Clinical Data: Vaginal bleeding   OBSTETRIC <14 WK ULTRASOUND   Technique: Transvaginal ultrasound was performed for evaluation  of the gestation as well as the maternal uterus and adnexal  regions.   Comparison: 08/20/2012.  Intrauterine gestational sac: Visualized/normal in shape.  Yolk sac: Present  Embryo: Present  Cardiac Activity: Present  Heart Rate: 158 bpm  MSD: mm w d  CRL: 16.7 mm 8.0 w 1.0 d [place] EDC: 04/10/2013   Maternal uterus/Adnexae:  Small subchorionic hemorrhage.  Normal right ovary. Corpus luteum cyst.  Normal left ovary.  No free pelvic fluid.   IMPRESSION:  Single living intrauterine fetus estimated at 8 weeks and 1 day  gestation.  Small subchorionic hemorrhage.   Original Report Authenticated By: Rudie Meyer, M.D.  Results for orders placed during the hospital encounter of 08/30/12 (from the past 24 hour(s))  WET PREP, GENITAL     Status: Abnormal   Collection Time   08/30/12  7:05 PM      Component Value Range   Yeast Wet Prep HPF POC NONE SEEN  NONE SEEN   Trich, Wet Prep NONE SEEN  NONE SEEN   Clue Cells Wet Prep HPF POC NONE SEEN  NONE SEEN   WBC, Wet Prep HPF POC FEW (*) NONE SEEN  URINALYSIS, ROUTINE W REFLEX MICROSCOPIC     Status: Abnormal   Collection Time   08/30/12  7:29 PM      Component Value Range   Color, Urine YELLOW  YELLOW   APPearance CLEAR  CLEAR   Specific Gravity, Urine 1.020  1.005 - 1.030   pH 6.5  5.0 - 8.0   Glucose, UA NEGATIVE  NEGATIVE mg/dL   Hgb urine dipstick LARGE (*) NEGATIVE   Bilirubin Urine NEGATIVE  NEGATIVE   Ketones, ur 15 (*) NEGATIVE mg/dL   Protein, ur NEGATIVE  NEGATIVE mg/dL   Urobilinogen, UA 1.0  0.0 - 1.0 mg/dL   Nitrite NEGATIVE  NEGATIVE   Leukocytes, UA NEGATIVE  NEGATIVE  URINE MICROSCOPIC-ADD ON     Status: Abnormal   Collection Time   08/30/12  7:29 PM      Component Value Range   Squamous Epithelial / LPF FEW (*)  RARE   WBC, UA 0-2  <3 WBC/hpf   RBC / HPF 0-2  <3 RBC/hpf   Urine-Other MUCOUS PRESENT  A: Single living IUP at 8w 1d Small subchorionic hemorrhage  P: Discharge home Bleeding precautions discussed Patient may follow-up in MAU as needed.   Freddi Starr, PA-C 08/30/2012 8:51 PM

## 2012-08-31 NOTE — MAU Provider Note (Signed)
Attestation of Attending Supervision of Advanced Practitioner: Evaluation and management procedures were performed by the PA/NP/CNM/OB Fellow under my supervision/collaboration. Chart reviewed and agree with management and plan.  Murlean Seelye V 08/31/2012 10:46 PM

## 2012-12-22 ENCOUNTER — Encounter (HOSPITAL_COMMUNITY): Payer: Self-pay | Admitting: *Deleted

## 2012-12-22 ENCOUNTER — Emergency Department (INDEPENDENT_AMBULATORY_CARE_PROVIDER_SITE_OTHER)
Admission: EM | Admit: 2012-12-22 | Discharge: 2012-12-22 | Disposition: A | Discharge status: Home / Self Care | Payer: Self-pay | Source: Home / Self Care | Attending: Emergency Medicine | Admitting: Emergency Medicine

## 2012-12-22 ENCOUNTER — Other Ambulatory Visit (HOSPITAL_COMMUNITY)
Admission: RE | Admit: 2012-12-22 | Discharge: 2012-12-22 | Disposition: A | Discharge status: Home / Self Care | Payer: Self-pay | Source: Ambulatory Visit | Attending: Emergency Medicine | Admitting: Emergency Medicine

## 2012-12-22 DIAGNOSIS — N760 Acute vaginitis: Secondary | ICD-10-CM | POA: Insufficient documentation

## 2012-12-22 DIAGNOSIS — Z113 Encounter for screening for infections with a predominantly sexual mode of transmission: Secondary | ICD-10-CM | POA: Insufficient documentation

## 2012-12-22 DIAGNOSIS — N730 Acute parametritis and pelvic cellulitis: Secondary | ICD-10-CM

## 2012-12-22 DIAGNOSIS — Z202 Contact with and (suspected) exposure to infections with a predominantly sexual mode of transmission: Secondary | ICD-10-CM

## 2012-12-22 LAB — POCT URINALYSIS DIP (DEVICE)
Bilirubin Urine: NEGATIVE
Glucose, UA: NEGATIVE mg/dL
Hgb urine dipstick: NEGATIVE
Ketones, ur: NEGATIVE mg/dL
Nitrite: NEGATIVE
Protein, ur: NEGATIVE mg/dL
Specific Gravity, Urine: 1.01 (ref 1.005–1.030)
Urobilinogen, UA: 0.2 mg/dL (ref 0.0–1.0)
pH: 6 (ref 5.0–8.0)

## 2012-12-22 LAB — POCT PREGNANCY, URINE: Preg Test, Ur: NEGATIVE

## 2012-12-22 MED ORDER — AZITHROMYCIN 250 MG PO TABS
ORAL_TABLET | ORAL | Status: AC
  Filled 2012-12-22: qty 4

## 2012-12-22 MED ORDER — HYDROCODONE-IBUPROFEN 7.5-200 MG PO TABS: 1.0000 | ORAL_TABLET | Freq: Four times a day (QID) | ORAL | Status: DC | PRN

## 2012-12-22 MED ORDER — AZITHROMYCIN 250 MG PO TABS
1000.0000 mg | ORAL_TABLET | Freq: Once | ORAL | Status: AC
  Administered 2012-12-22: 1000 mg via ORAL

## 2012-12-22 MED ORDER — CEFTRIAXONE SODIUM 1 G IJ SOLR
1.0000 g | Freq: Once | INTRAMUSCULAR | Status: AC
  Administered 2012-12-22: 1 g via INTRAMUSCULAR

## 2012-12-22 MED ORDER — CEFTRIAXONE SODIUM 1 G IJ SOLR
INTRAMUSCULAR | Status: AC
  Filled 2012-12-22: qty 10

## 2012-12-22 MED ORDER — METRONIDAZOLE 500 MG PO TABS: 500.0000 mg | ORAL_TABLET | Freq: Three times a day (TID) | ORAL | Status: DC

## 2012-12-22 NOTE — ED Notes (Signed)
Pt  Reports  Symptoms  Of a  Vaginal  Discharge     With  Low  Abdominal pain    X  1  Week  Sitting  Upright on  Exam table        Speaking in  Complete  sentances    Ambulated  To  Room  With a  Slow  Steady  Gait

## 2012-12-22 NOTE — Discharge Instructions (Signed)
Pelvic Inflammatory Disease °Pelvic inflammatory disease (PID) refers to an infection in some or all of the female organs. The infection can be in the uterus, ovaries, fallopian tubes, or the surrounding tissues in the pelvis. PID can cause abdominal or pelvic pain that comes on suddenly (acute pelvic pain). PID is a serious infection because it can lead to lasting (chronic) pelvic pain or the inability to have children (infertile).  °CAUSES  °The infection is often caused by the normal bacteria found in the vaginal tissues. PID may also be caused by an infection that is spread during sexual contact. PID can also occur following:  °· The birth of a baby.   °· A miscarriage.   °· An abortion.   °· Major pelvic surgery.   °· The use of an intrauterine device (IUD).   °· A sexual assault.   °RISK FACTORS °Certain factors can put a person at higher risk for PID, such as: °· Being younger than 25 years. °· Being sexually active at a young age. °· Using nonbarrier contraception. °· Having multiple sexual partners. °· Having sex with someone who has symptoms of a genital infection. °· Using oral contraception. °Other times, certain behaviors can increase the possibility of getting PID, such as: °· Having sex during your period. °· Using a vaginal douche. °· Having an intrauterine device (IUD) in place. °SYMPTOMS  °· Abdominal or pelvic pain.   °· Fever.   °· Chills.   °· Abnormal vaginal discharge. °· Abnormal uterine bleeding.   °· Unusual pain shortly after finishing your period. °DIAGNOSIS  °Your caregiver will choose some of the following methods to make a diagnosis, such as:  °· Performing a physical exam and history. A pelvic exam typically reveals a very tender uterus and surrounding pelvis.   °· Ordering laboratory tests including a pregnancy test, blood tests, and urine test.  °· Ordering cultures of the vagina and cervix to check for a sexually transmitted infection (STI). °· Performing an ultrasound.    °· Performing a laparoscopic procedure to look inside the pelvis.   °TREATMENT  °· Antibiotic medicines may be prescribed and taken by mouth.   °· Sexual partners may be treated when the infection is caused by a sexually transmitted disease (STD).   °· Hospitalization may be needed to give antibiotics intravenously. °· Surgery may be needed, but this is rare. °It may take weeks until you are completely well. If you are diagnosed with PID, you should also be checked for human immunodeficiency virus (HIV).   °HOME CARE INSTRUCTIONS  °· If given, take your antibiotics as directed. Finish the medicine even if you start to feel better.   °· Only take over-the-counter or prescription medicines for pain, discomfort, or fever as directed by your caregiver.   °· Do not have sexual intercourse until treatment is completed or as directed by your caregiver. If PID is confirmed, your recent sexual partner(s) will need treatment.   °· Keep your follow-up appointments. °SEEK MEDICAL CARE IF:  °· You have increased or abnormal vaginal discharge.   °· You need prescription medicine for your pain.   °· You vomit.   °· You cannot take your medicines.   °· Your partner has an STD.   °SEEK IMMEDIATE MEDICAL CARE IF:  °· You have a fever.   °· You have increased abdominal or pelvic pain.   °· You have chills.   °· You have pain when you urinate.   °· You are not better after 72 hours following treatment.   °MAKE SURE YOU:  °· Understand these instructions. °· Will watch your condition. °· Will get help right away if you are not doing well or get worse. °  pelvic pain.    You have chills.    You have pain when you urinate.    You are not better after 72 hours following treatment.   MAKE SURE YOU:    Understand these instructions.   Will watch your condition.   Will get help right away if you are not doing well or get worse.  Document Released: 07/29/2005 Document Revised: 10/21/2011 Document Reviewed: 07/25/2011  [employer] Patient Information 2013 [employer]

## 2012-12-22 NOTE — ED Provider Notes (Signed)
Chief Complaint:   Chief Complaint  Patient presents with  . Vaginal Discharge    History of Present Illness:   Shelly Gibbs is a 27 year old female who was informed by her boyfriend that she tested positive for gonorrhea at the health department. She herself has had a one half week history of vaginal discharge and abdominal and lower back pain. The discharge is brown and has a fishy odor. She denies any vaginal itching. The abdominal pain is located in the lower pelvis bilaterally with radiation into the back. The back pain is rated 8/10 in intensity and the abdominal pain is rated a 4/10 in intensity. The pain is constant, nothing makes it better or worse. She denies any fever, chills, nausea, or vomiting. She's had no bowel or bladder problems. Her menses have been regular. Last menstrual period was April 26. She uses condoms for birth control. She has a history of BV. She had a pregnancy termination in February.  Review of Systems:  Other than noted above, the patient denies any of the following symptoms: Systemic:  No fever, chills, sweats, or weight loss. GI:  No abdominal pain, nausea, anorexia, vomiting, diarrhea, constipation, melena or hematochezia. GU:  No dysuria, frequency, urgency, hematuria, vaginal discharge, itching, or abnormal vaginal bleeding. Skin:  No rash or itching.  PMFSH:  Past medical history, family history, social history, meds, and allergies were reviewed.  She is allergic to Tylenol and latex. She has asthma and takes no medications for this.  Physical Exam:   Vital signs:  BP 124/89  Pulse 60  Temp(Src) 98.4 F (36.9 C) (Oral)  Resp 18  SpO2 100%  LMP 12/07/2011  Breastfeeding? Unknown General:  Alert, oriented and in no distress. Lungs:  Breath sounds clear and equal bilaterally.  No wheezes, rales or rhonchi. Heart:  Regular rhythm.  No gallops or murmers. Abdomen:  Soft, flat and non-distended.  No organomegaly or mass.  No tenderness, guarding or  rebound.  Bowel sounds normally active. Pelvic exam:  Normal external genitalia. There is a thick, yellow, malodorous drainage in the vaginal vault coming from the cervix there was no pain on cervical motion. Uterus was normal in size and shape and nontender. No adnexal masses or tenderness. Skin:  Clear, warm and dry.  Labs:   Results for orders placed during the hospital encounter of 12/22/12  POCT URINALYSIS DIP (DEVICE)      Result Value Range   Glucose, UA NEGATIVE  NEGATIVE mg/dL   Bilirubin Urine NEGATIVE  NEGATIVE   Ketones, ur NEGATIVE  NEGATIVE mg/dL   Specific Gravity, Urine 1.010  1.005 - 1.030   Hgb urine dipstick NEGATIVE  NEGATIVE   pH 6.0  5.0 - 8.0   Protein, ur NEGATIVE  NEGATIVE mg/dL   Urobilinogen, UA 0.2  0.0 - 1.0 mg/dL   Nitrite NEGATIVE  NEGATIVE   Leukocytes, UA TRACE (*) NEGATIVE  POCT PREGNANCY, URINE      Result Value Range   Preg Test, Ur NEGATIVE  NEGATIVE    DNA probes for gonorrhea, Chlamydia, Candida, Trichomonas, Gardnerella were obtained. Results are pending at this time. Will call patient back about posture results.  Course in Urgent Care Center:   Given Rocephin 1 g IM and azithromycin 1000 mg by mouth.  Assessment:  The primary encounter diagnosis was PID (acute pelvic inflammatory disease). A diagnosis of Exposure to STD was also pertinent to this visit.  Plan:   1.  The following meds were prescribed:  Discharge Medication List as of 12/22/2012  3:41 PM    START taking these medications   Details  HYDROcodone-ibuprofen (VICOPROFEN) 7.5-200 MG per tablet Take 1 tablet by mouth every 6 (six) hours as needed for pain., Starting 12/22/2012, Until Discontinued, Print    metroNIDAZOLE (FLAGYL) 500 MG tablet Take 1 tablet (500 mg total) by mouth 3 (three) times daily., Starting 12/22/2012, Until Discontinued, Normal       2.  The patient was instructed in symptomatic care and handouts were given. 3.  The patient was told to return if becoming  worse in any way, if no better in 3 or 4 days, and given some red flag symptoms such as worsening pain, persistent vomiting, or persistent discharge that would indicate earlier return. 4.  Follow up here if no better in 3 or 4 days.    Reuben Likes, MD 12/22/12 [phone]

## 2012-12-24 ENCOUNTER — Telehealth (HOSPITAL_COMMUNITY): Payer: Self-pay | Admitting: *Deleted

## 2012-12-24 NOTE — ED Notes (Signed)
Chart review.

## 2012-12-24 NOTE — ED Notes (Signed)
GC neg., Chlamydia pos., Affirm: Gardnerella pos., Candida and Trich neg.,  Pt. adequately treated. I called pt.  Pt. verified x 2 and given results.  Pt. told she was adequately treated for Chlamydia for with Zithromax and for bacterial vaginosis with the Flagyl.  Pt. instructed to notify her partner, no sex for 1 week and to practice safe sex. Pt. told she can get HIV testing at the [facility]. STD clinic, by appointment.  DHHS form completed and faxed to the [facility] Department. Vassie Moselle 12/24/2012

## 2013-01-28 ENCOUNTER — Emergency Department (INDEPENDENT_AMBULATORY_CARE_PROVIDER_SITE_OTHER)
Admission: EM | Admit: 2013-01-28 | Discharge: 2013-01-28 | Disposition: A | Discharge status: Home / Self Care | Payer: Self-pay | Source: Home / Self Care | Attending: Emergency Medicine | Admitting: Emergency Medicine

## 2013-01-28 ENCOUNTER — Other Ambulatory Visit (HOSPITAL_COMMUNITY)
Admission: RE | Admit: 2013-01-28 | Discharge: 2013-01-28 | Disposition: A | Discharge status: Home / Self Care | Payer: Self-pay | Source: Ambulatory Visit | Attending: Emergency Medicine | Admitting: Emergency Medicine

## 2013-01-28 ENCOUNTER — Encounter (HOSPITAL_COMMUNITY): Payer: Self-pay | Admitting: *Deleted

## 2013-01-28 DIAGNOSIS — N760 Acute vaginitis: Secondary | ICD-10-CM | POA: Insufficient documentation

## 2013-01-28 DIAGNOSIS — Z113 Encounter for screening for infections with a predominantly sexual mode of transmission: Secondary | ICD-10-CM | POA: Insufficient documentation

## 2013-01-28 LAB — POCT URINALYSIS DIP (DEVICE)
Bilirubin Urine: NEGATIVE
Glucose, UA: NEGATIVE mg/dL
Ketones, ur: NEGATIVE mg/dL
Nitrite: NEGATIVE
Protein, ur: NEGATIVE mg/dL
Specific Gravity, Urine: >=1.03 (ref 1.005–1.030)
Urobilinogen, UA: 1 mg/dL (ref 0.0–1.0)
pH: 6 (ref 5.0–8.0)

## 2013-01-28 LAB — POCT PREGNANCY, URINE: Preg Test, Ur: NEGATIVE

## 2013-01-28 MED ORDER — CEFTRIAXONE SODIUM 250 MG IJ SOLR
INTRAMUSCULAR | Status: AC
  Filled 2013-01-28: qty 250

## 2013-01-28 MED ORDER — CEFTRIAXONE SODIUM 250 MG IJ SOLR
250.0000 mg | Freq: Once | INTRAMUSCULAR | Status: AC
  Administered 2013-01-28: 250 mg via INTRAMUSCULAR

## 2013-01-28 MED ORDER — AZITHROMYCIN 250 MG PO TABS
1000.0000 mg | ORAL_TABLET | Freq: Once | ORAL | Status: AC
  Administered 2013-01-28: 1000 mg via ORAL

## 2013-01-28 MED ORDER — AZITHROMYCIN 250 MG PO TABS
ORAL_TABLET | ORAL | Status: AC
  Filled 2013-01-28: qty 4

## 2013-01-28 MED ORDER — ACYCLOVIR 400 MG PO TABS: 400.0000 mg | ORAL_TABLET | Freq: Three times a day (TID) | ORAL | Status: AC

## 2013-01-28 NOTE — ED Provider Notes (Signed)
History     CSN: [id number]  Arrival date & time 01/28/13  1425   First MD Initiated Contact with Patient 01/28/13 1439      Chief Complaint  Patient presents with  . Vaginal Discharge    (Consider location/radiation/quality/duration/timing/severity/associated sxs/prior treatment) HPI Comments: Patient presents urgent care [facility] vaginal discharge irritation for approximately a week also with a tender and soreness on her vaginal introitus. She describes that recently she was treated in May for a Chlamydia infection. She describes she use protection after having been treated but failed to do so on one occasion. She reports her partner reported to her that he was treated but she is uncertain. Patient denies any pelvic pain, fevers or urinary symptoms such as increased frequency pressure burning with urination.  Patient is a 27 y.o. female presenting with vaginal discharge.  Vaginal Discharge Quality:  Mucoid and watery Severity:  Moderate Onset quality:  Sudden Timing:  Constant Progression:  Worsening Context: not after intercourse   Relieved by:  Nothing Associated symptoms: genital lesions and vaginal itching   Associated symptoms: no abdominal pain, no dysuria, no fever, no nausea, no urinary frequency and no vomiting   Risk factors: no endometriosis, no immunosuppression and no STI     Past Medical History  Diagnosis Date  . Asthma   . Chlamydia   . UTI (lower urinary tract infection)   . Yeast infection   . Headache(784.0)   . BV (bacterial vaginosis)   . Gonorrhea 08-03-2012    Past Surgical History  Procedure Laterality Date  . Induced abortion      x 2  . Dilation and curettage of uterus      Family History  Problem Relation Age of Onset  . Diabetes Maternal Aunt   . Anesthesia problems Neg Hx   . Other Neg Hx   . Hearing loss Neg Hx     History  Substance Use Topics  . Smoking status: Never Smoker   . Smokeless tobacco: Never Used  . Alcohol Use:  Yes     Comment: occasional alcohol    OB History   Grav Para Term Preterm Abortions TAB SAB Ect Mult Living   7 1 1  0 5 4 1  0 0 1      Review of Systems  Constitutional: Negative for fever, chills, diaphoresis, activity change and appetite change.  Gastrointestinal: Negative for nausea, vomiting and abdominal pain.  Genitourinary: Positive for vaginal discharge and genital sores. Negative for dysuria, urgency, hematuria, flank pain, decreased urine volume, difficulty urinating, menstrual problem and pelvic pain.  Musculoskeletal: Negative for myalgias and back pain.  Skin: Negative for color change and rash.    Allergies  Latex and Tylenol  Home Medications   Current Outpatient Rx  Name  Route  Sig  Dispense  Refill  . acyclovir (ZOVIRAX) 400 MG tablet   Oral   Take 1 tablet (400 mg total) by mouth 3 (three) times daily.   15 tablet   0   . HYDROcodone-ibuprofen (VICOPROFEN) 7.5-200 MG per tablet   Oral   Take 1 tablet by mouth every 6 (six) hours as needed for pain.   30 tablet   0   . metroNIDAZOLE (FLAGYL) 500 MG tablet   Oral   Take 1 tablet (500 mg total) by mouth 3 (three) times daily.   30 tablet   0   . metroNIDAZOLE (METROGEL VAGINAL) 0.75 % vaginal gel   Vaginal   Place 1 Applicatorful vaginally  2 (two) times daily.   70 g   0   . ondansetron (ZOFRAN ODT) 8 MG disintegrating tablet   Oral   Take 1 tablet (8 mg total) by mouth every 8 (eight) hours as needed for nausea.   20 tablet   0   . promethazine (PHENERGAN) 25 MG tablet   Oral   Take 1 tablet (25 mg total) by mouth every 6 (six) hours as needed for nausea.   30 tablet   0     BP 116/81  Pulse 76  Temp(Src) 98.9 F (37.2 C)  Resp 18  SpO2 100%  LMP 07/04/2012  Physical Exam  Nursing note and vitals reviewed. Constitutional: She appears well-developed.  Abdominal: Soft. She exhibits no distension. There is no tenderness. There is no rebound and no guarding. Hernia confirmed  negative in the right inguinal area and confirmed negative in the left inguinal area.  Genitourinary:    There is tenderness on the right labia. There is tenderness on the left labia. There is erythema around the vagina. No tenderness or bleeding around the vagina. No signs of injury around the vagina. Vaginal discharge found.  Lymphadenopathy:       Right: No inguinal adenopathy present.       Left: No inguinal adenopathy present.  Neurological: She is alert.  Skin: No rash noted. No erythema.    ED Course  Procedures (including critical care time)  Labs Reviewed  POCT URINALYSIS DIP (DEVICE) - Abnormal; Notable for the following:    Hgb urine dipstick TRACE (*)    Leukocytes, UA SMALL (*)    All other components within normal limits  HERPES SIMPLEX VIRUS CULTURE  POCT PREGNANCY, URINE  CERVICOVAGINAL ANCILLARY ONLY   No results found.   1. Vaginitis and vulvovaginitis       MDM  Vaginal discharge with- genitalia lesions. Suspect of genital herpes. Samples have been obtained for both a cervical cytology studies and a viral culture media. Patient has been treated empirically for both gonorrhea and Chlamydia and had been started on anti-herpetic treatment       Jimmie Molly, MD 01/28/13 1610

## 2013-01-28 NOTE — ED Notes (Addendum)
Pt    Reports  Watery  Discharge      Vaginal  Discharge      With  Itching  X   1  Week      denys  Any pain  Ambulated  To  Room  With a  Steady  Fluid  Gait        Pt  Had  chlymydia    Last  Month  But  Was  Treated

## 2013-02-01 ENCOUNTER — Telehealth (HOSPITAL_COMMUNITY): Payer: Self-pay | Admitting: *Deleted

## 2013-02-01 LAB — HERPES SIMPLEX VIRUS CULTURE: Culture: DETECTED

## 2013-02-01 NOTE — ED Notes (Signed)
Pt. called and left a message requesting her lab results.  GC/Chlamydia and Affirm all neg.  Herpes culture pending.  I called pt. and left a message to call. Vassie Moselle 02/01/2013

## 2013-02-01 NOTE — ED Notes (Signed)
Pt. Called back while I was on the phone.  She hung up before I could take her call.  I called her back.  Pt. verified x 2 and given results ( all neg. and Herpes pending). I told her I would call her back if the Herpes is pos. If she does not hear from me, it means it was negative.  Pt. voiced understanding. Vassie Moselle 02/01/2013

## 2013-02-03 ENCOUNTER — Telehealth (HOSPITAL_COMMUNITY): Payer: Self-pay | Admitting: *Deleted

## 2013-02-03 NOTE — ED Notes (Signed)
Herpes culture: Herpes Type 2 detected.  Pt. adequately treated with Zovirax.  I called pt.  Pt. verified x 2 and given results.  Pt. told she was adequately treated.  Pt. instructed to notify her partner, that she can pass the virus even when she doesn't have an outbreak, so always practice safe sex, get treated for each outbreak with Acyclovir or Valtrex. You may want to get an OB-GYN doctor who can call in a prescription for you when you have an outbreak or give a 1 yr. Rx. to fill as needed for each outbreak. The doctor can give you prophylactic tx. if you are having frequent outbreaks. Pt. voiced understanding

## 2013-03-30 IMAGING — US US PELVIS COMPLETE
1 series · 14 of 25 positions shown · non-contrast
Comparison: 04/06/2009

CLINICAL DATA: Pelvic pain



[Series 1: us pelvis complete · 14 of 51 slices shown]
[im 1/51]
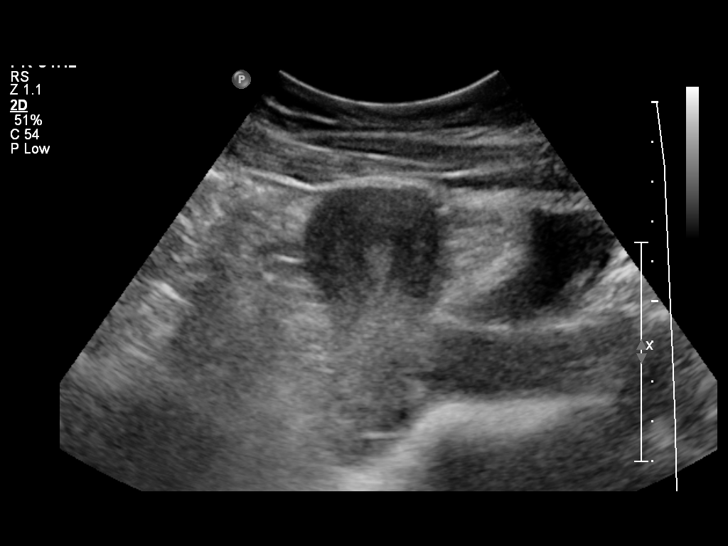
[im 5/51]
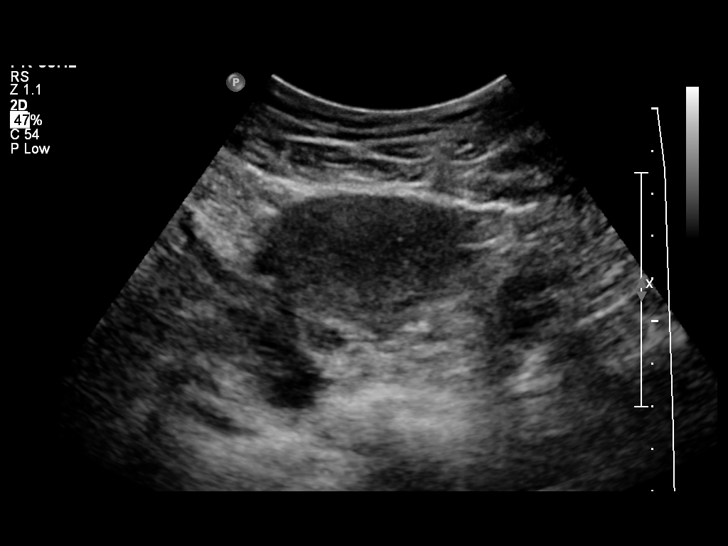
[im 9/51]
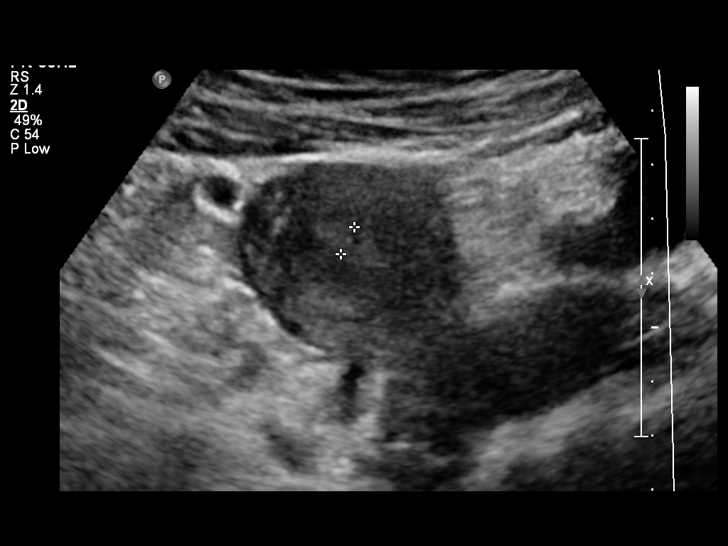
[im 13/51]
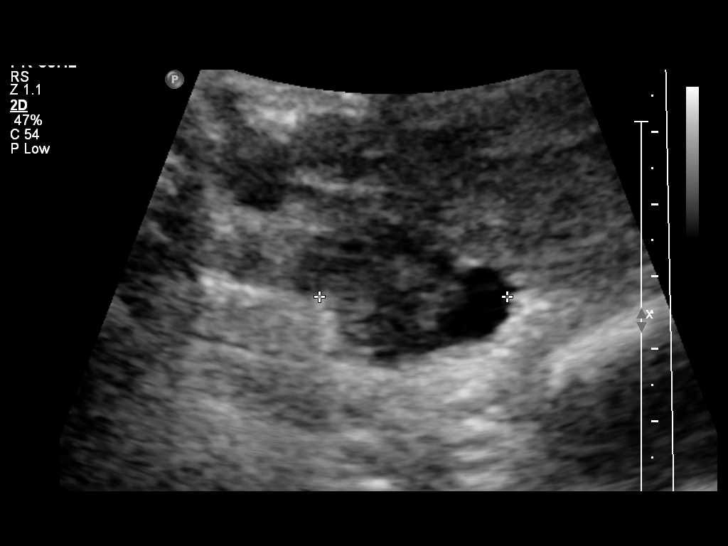
[im 17/51]
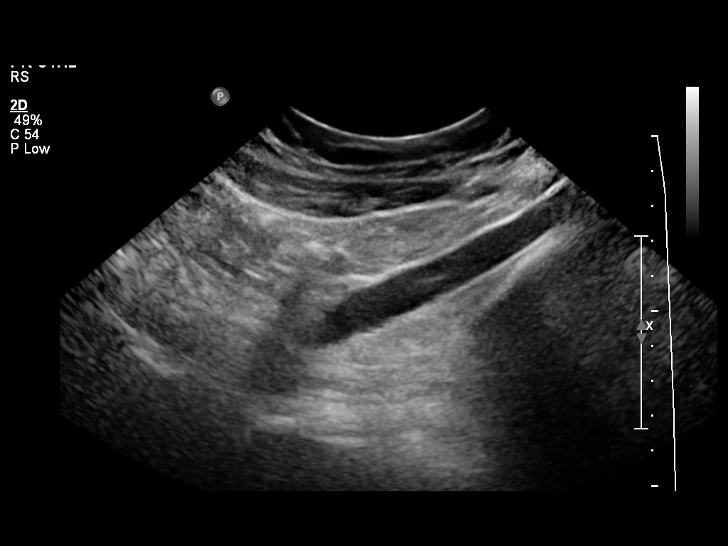
[im 19/51]
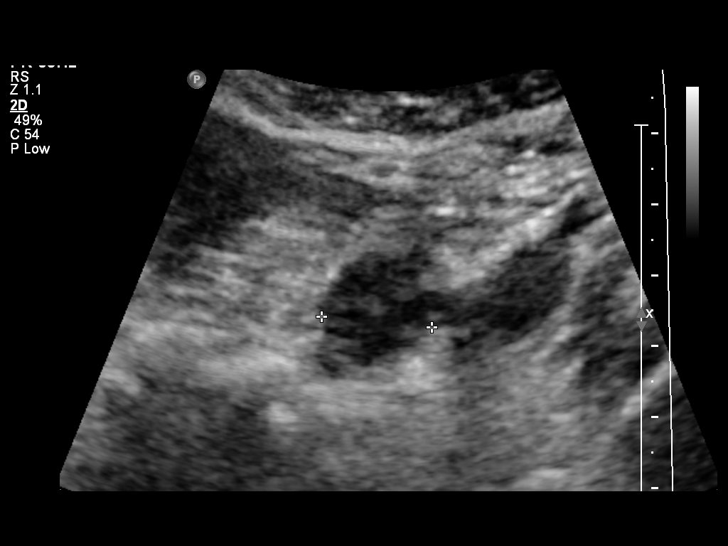
[im 23/51]
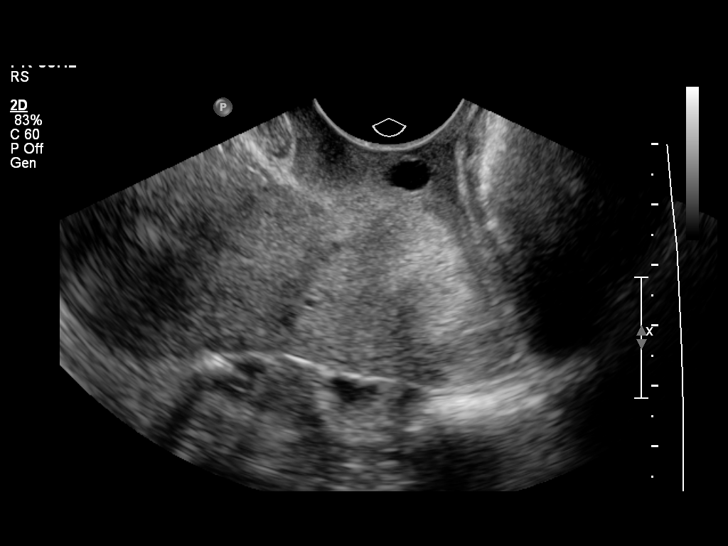
[im 28/51]
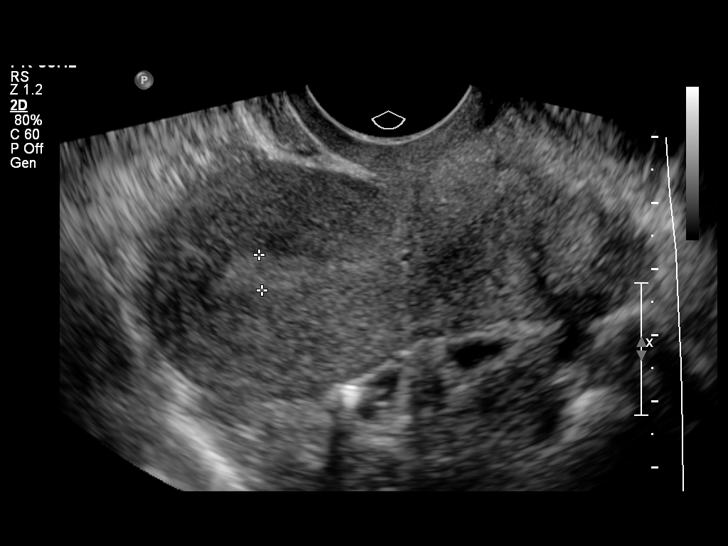
[im 32/51]
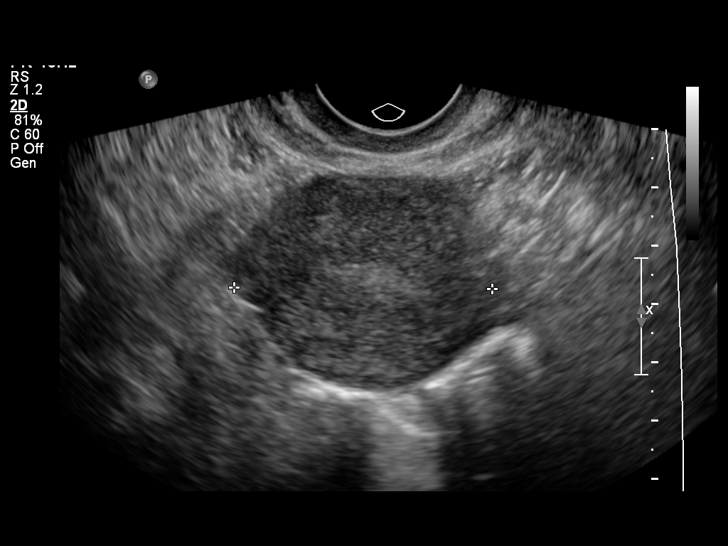
[im 34/51]
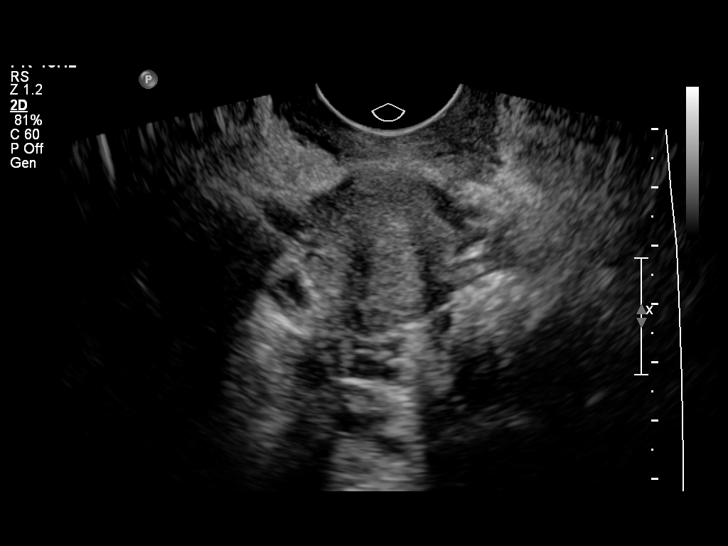
[im 38/51]
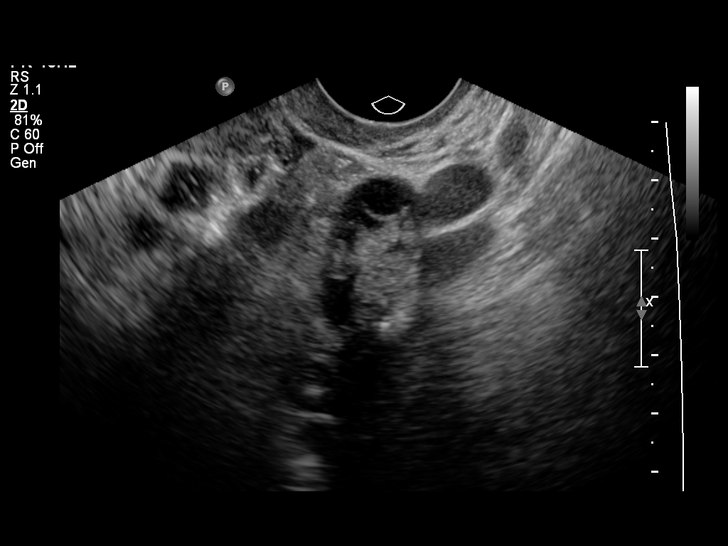
[im 42/51]
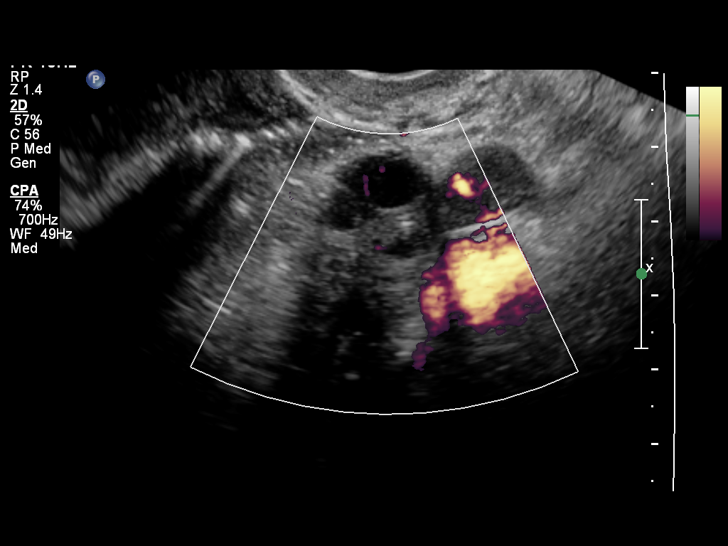
[im 46/51]
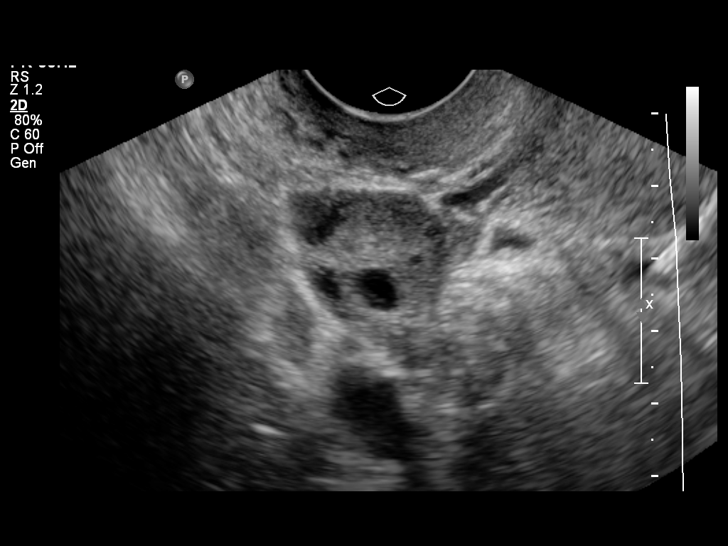
[im 51/51]
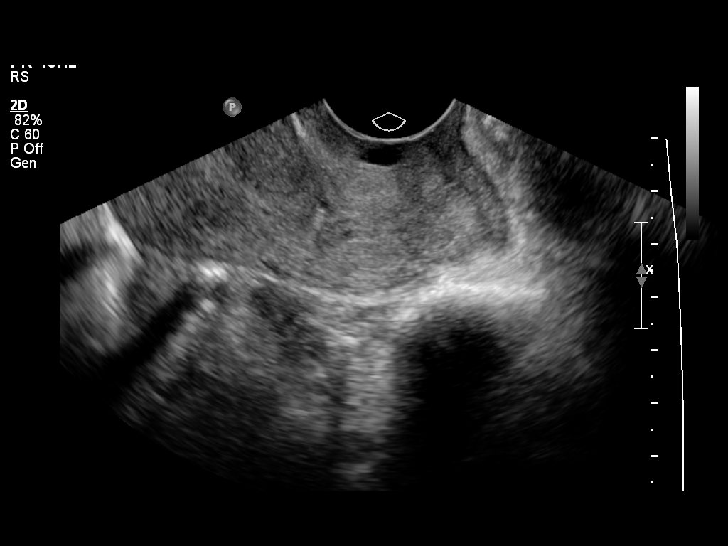

[14 of 25 positions shown; findings below may reference images not displayed]

FINDINGS: Uterus:  7.5 x 3.7 x 4.4 cm.  No fibroids or other uterine masses
identified.

Endometrium:  Measures 5.4 mm.  Normal in appearance.

Right ovary:  Measures 3.1 x 2.  2 x 210 0 cm.  Normal
appearance/no adnexal mass.

Left ovary:  Measures 2.6 x 1.7 x 1.5 cm.  Normal appearance/no
adnexal mass.

Other findings:  No free fluid.
IMPRESSION: Normal study.  No evidence of pelvic mass or other significant
abnormality.

## 2013-04-24 ENCOUNTER — Inpatient Hospital Stay (HOSPITAL_COMMUNITY)
Admission: AD | Admit: 2013-04-24 | Discharge: 2013-04-24 | Disposition: A | Discharge status: Home / Self Care | Payer: Self-pay | Source: Ambulatory Visit | Attending: Obstetrics & Gynecology | Admitting: Obstetrics & Gynecology

## 2013-04-24 ENCOUNTER — Encounter (HOSPITAL_COMMUNITY): Payer: Self-pay | Admitting: *Deleted

## 2013-04-24 DIAGNOSIS — N760 Acute vaginitis: Secondary | ICD-10-CM | POA: Insufficient documentation

## 2013-04-24 DIAGNOSIS — R1084 Generalized abdominal pain: Secondary | ICD-10-CM

## 2013-04-24 DIAGNOSIS — B9689 Other specified bacterial agents as the cause of diseases classified elsewhere: Secondary | ICD-10-CM | POA: Insufficient documentation

## 2013-04-24 DIAGNOSIS — O2390 Unspecified genitourinary tract infection in pregnancy, unspecified trimester: Secondary | ICD-10-CM | POA: Insufficient documentation

## 2013-04-24 DIAGNOSIS — Z3490 Encounter for supervision of normal pregnancy, unspecified, unspecified trimester: Secondary | ICD-10-CM

## 2013-04-24 DIAGNOSIS — A499 Bacterial infection, unspecified: Secondary | ICD-10-CM | POA: Insufficient documentation

## 2013-04-24 DIAGNOSIS — N898 Other specified noninflammatory disorders of vagina: Secondary | ICD-10-CM

## 2013-04-24 DIAGNOSIS — R109 Unspecified abdominal pain: Secondary | ICD-10-CM | POA: Insufficient documentation

## 2013-04-24 LAB — WET PREP, GENITAL
Trich, Wet Prep: NONE SEEN
Yeast Wet Prep HPF POC: NONE SEEN

## 2013-04-24 LAB — URINALYSIS, ROUTINE W REFLEX MICROSCOPIC
Bilirubin Urine: NEGATIVE
Glucose, UA: NEGATIVE mg/dL
Hgb urine dipstick: NEGATIVE
Ketones, ur: 15 mg/dL — AB
Leukocytes, UA: NEGATIVE
Nitrite: NEGATIVE
Protein, ur: NEGATIVE mg/dL
Specific Gravity, Urine: 1.025 (ref 1.005–1.030)
Urobilinogen, UA: 1 mg/dL (ref 0.0–1.0)
pH: 6 (ref 5.0–8.0)

## 2013-04-24 LAB — POCT PREGNANCY, URINE: Preg Test, Ur: POSITIVE — AB

## 2013-04-24 MED ORDER — METRONIDAZOLE 500 MG PO TABS: 500.0000 mg | ORAL_TABLET | Freq: Two times a day (BID) | ORAL | Status: DC

## 2013-04-24 NOTE — Progress Notes (Signed)
Pt states has to leave to get home to children due to babysitter having to leave. Shelly Rodney NP discussed need for pt to have further eval to be sure pt does not have ectopic pregnancy. Pt agrees to return in am

## 2013-04-24 NOTE — MAU Provider Note (Signed)
History     CSN: [id number]  Arrival date and time: 04/24/13 0100   None     Chief Complaint  Patient presents with  . Abdominal Pain    right side  . Vaginal Discharge   HPI Comments: Shelly Gibbs 27 y.o. [id number] comes to MAU with complaints of right sided pain and vaginal discharge. She was unaware of pregnancy status tonight. Her discharge has been ongoing since July. Pain started 3 days ago. She is unable to stay in MAU tonight due to babysitting issues and will return in morning for evaluation of pain in early pregnancy.      Abdominal Pain  Vaginal Discharge Associated symptoms include abdominal pain.      Past Medical History  Diagnosis Date  . Asthma   . Chlamydia   . UTI (lower urinary tract infection)   . Yeast infection   . Headache(784.0)   . BV (bacterial vaginosis)   . Gonorrhea 08-03-2012    Past Surgical History  Procedure Laterality Date  . Induced abortion      x 2  . Dilation and curettage of uterus      Family History  Problem Relation Age of Onset  . Diabetes Maternal Aunt   . Anesthesia problems Neg Hx   . Other Neg Hx   . Hearing loss Neg Hx     History  Substance Use Topics  . Smoking status: Never Smoker   . Smokeless tobacco: Never Used  . Alcohol Use: Yes     Comment: occasional alcohol    Allergies:  Allergies  Allergen Reactions  . Latex Itching    Burning   . Tylenol [Acetaminophen] Hives    Prescriptions prior to admission  Medication Sig Dispense Refill  . HYDROcodone-ibuprofen (VICOPROFEN) 7.5-200 MG per tablet Take 1 tablet by mouth every 6 (six) hours as needed for pain.  30 tablet  0  . metroNIDAZOLE (FLAGYL) 500 MG tablet Take 1 tablet (500 mg total) by mouth 3 (three) times daily.  30 tablet  0  . metroNIDAZOLE (METROGEL VAGINAL) 0.75 % vaginal gel Place 1 Applicatorful vaginally 2 (two) times daily.  70 g  0  . ondansetron (ZOFRAN ODT) 8 MG disintegrating tablet Take 1 tablet (8 mg total) by mouth  every 8 (eight) hours as needed for nausea.  20 tablet  0  . promethazine (PHENERGAN) 25 MG tablet Take 1 tablet (25 mg total) by mouth every 6 (six) hours as needed for nausea.  30 tablet  0    Review of Systems  Constitutional: Negative.   HENT: Negative.   Eyes: Negative.   Respiratory: Negative.   Cardiovascular: Negative.   Gastrointestinal: Positive for abdominal pain.  Genitourinary: Negative.        Vaginal discharge with odor  Musculoskeletal: Negative.   Skin: Negative.   Neurological: Negative.   Psychiatric/Behavioral: Negative.    Physical Exam   Blood pressure 132/80, pulse 92, temperature 98.2 F (36.8 C), temperature source Oral, resp. rate 18, height 5\' 2"  (1.575 m), weight 161 lb 6.4 oz (73.211 kg), last menstrual period 03/24/2013.  Physical Exam  Constitutional: She is oriented to person, place, and time. She appears well-developed and well-nourished. No distress.  HENT:  Head: Normocephalic and atraumatic.  Eyes: Pupils are equal, round, and reactive to light.  Cardiovascular: Normal rate and regular rhythm.   Respiratory: Effort normal and breath sounds normal.  GI: Soft. Bowel sounds are normal. She exhibits no distension and no mass. There  is no tenderness. There is no rebound and no guarding.  Genitourinary: Uterus normal. Vaginal discharge found.  External: Negative Vagina: thick white odorous discharge Biman: Negative  Musculoskeletal: Normal range of motion.  Neurological: She is alert and oriented to person, place, and time.  Skin: Skin is warm and dry.  Psychiatric: She has a normal mood and affect. Her behavior is normal. Judgment and thought content normal.    MAU Course  Procedures  MDM Wet prep only as patient had to leave  Assessment and Plan  A: Pregnancy with pelvic discomfort Bacterial vaginosis  P: Advised to return asap for complete evaluation of pain in early pregnancy Flagyl 500mg  po BID x7 days No alcohol / increase  fluids  Carolynn Serve 04/24/2013, 2:25 AM

## 2013-04-24 NOTE — MAU Note (Signed)
Right sided abdominal pain x 3days. Vaginal discharge on and off since July.

## 2013-04-25 LAB — GC/CHLAMYDIA PROBE AMP
CT Probe RNA: NEGATIVE
GC Probe RNA: NEGATIVE

## 2013-04-26 ENCOUNTER — Telehealth (HOSPITAL_COMMUNITY): Payer: Self-pay

## 2013-05-07 ENCOUNTER — Inpatient Hospital Stay (HOSPITAL_COMMUNITY)
Admission: AD | Admit: 2013-05-07 | Discharge: 2013-05-07 | Disposition: A | Discharge status: Home / Self Care | Payer: Self-pay | Source: Ambulatory Visit | Attending: Obstetrics & Gynecology | Admitting: Obstetrics & Gynecology

## 2013-05-07 ENCOUNTER — Encounter (HOSPITAL_COMMUNITY): Payer: Self-pay | Admitting: *Deleted

## 2013-05-07 DIAGNOSIS — J020 Streptococcal pharyngitis: Secondary | ICD-10-CM

## 2013-05-07 DIAGNOSIS — J392 Other diseases of pharynx: Secondary | ICD-10-CM | POA: Insufficient documentation

## 2013-05-07 DIAGNOSIS — O99891 Other specified diseases and conditions complicating pregnancy: Secondary | ICD-10-CM | POA: Insufficient documentation

## 2013-05-07 DIAGNOSIS — R059 Cough, unspecified: Secondary | ICD-10-CM | POA: Insufficient documentation

## 2013-05-07 DIAGNOSIS — J329 Chronic sinusitis, unspecified: Secondary | ICD-10-CM | POA: Insufficient documentation

## 2013-05-07 DIAGNOSIS — J019 Acute sinusitis, unspecified: Secondary | ICD-10-CM

## 2013-05-07 DIAGNOSIS — R05 Cough: Secondary | ICD-10-CM | POA: Insufficient documentation

## 2013-05-07 LAB — URINALYSIS, ROUTINE W REFLEX MICROSCOPIC
Bilirubin Urine: NEGATIVE
Glucose, UA: NEGATIVE mg/dL
Hgb urine dipstick: NEGATIVE
Ketones, ur: 15 mg/dL — AB
Leukocytes, UA: NEGATIVE
Nitrite: NEGATIVE
Protein, ur: NEGATIVE mg/dL
Specific Gravity, Urine: 1.025 (ref 1.005–1.030)
Urobilinogen, UA: 1 mg/dL (ref 0.0–1.0)
pH: 6 (ref 5.0–8.0)

## 2013-05-07 LAB — RAPID STREP SCREEN (MED CTR MEBANE ONLY): Streptococcus, Group A Screen (Direct): NEGATIVE

## 2013-05-07 MED ORDER — AMOXICILLIN 500 MG PO CAPS: 500.0000 mg | ORAL_CAPSULE | Freq: Two times a day (BID) | ORAL | Status: DC

## 2013-05-07 MED ORDER — METOCLOPRAMIDE HCL 10 MG PO TABS: 10.0000 mg | ORAL_TABLET | Freq: Four times a day (QID) | ORAL | Status: DC | PRN

## 2013-05-07 NOTE — MAU Provider Note (Signed)
History     CSN: [id number]  Arrival date and time: 05/07/13 1243   First Provider Initiated Contact with Patient 05/07/13 1357      Chief Complaint  Patient presents with  . Sore Throat  . Cough  . Headache  . Morning Sickness   HPI Ms. Brittania L Rousseau is a 27 y.o. [id number] at [redacted]w[redacted]d who presents to MAU today with cold symptoms. The patient states that she has had sore throat, nasal congestion, non-productive cough, left ear pain and nausea x 1 week. She had fever 2 days ago, but none since. She denies vaginal bleeding, abdominal pain or other pregnancy concerns today.   OB History   Grav Para Term Preterm Abortions TAB SAB Ect Mult Living   8 1 1  0 6 4 1  0 0 1      Past Medical History  Diagnosis Date  . Asthma   . Chlamydia   . UTI (lower urinary tract infection)   . Yeast infection   . Headache(784.0)   . BV (bacterial vaginosis)   . Gonorrhea 08-03-2012    Past Surgical History  Procedure Laterality Date  . Induced abortion      x 2  . Dilation and curettage of uterus      Family History  Problem Relation Age of Onset  . Diabetes Maternal Aunt   . Anesthesia problems Neg Hx   . Other Neg Hx   . Hearing loss Neg Hx     History  Substance Use Topics  . Smoking status: Never Smoker   . Smokeless tobacco: Never Used  . Alcohol Use: Yes     Comment: occasional alcohol    Allergies:  Allergies  Allergen Reactions  . Latex Itching    Burning   . Tylenol [Acetaminophen] Hives    Prescriptions prior to admission  Medication Sig Dispense Refill  . ondansetron (ZOFRAN ODT) 8 MG disintegrating tablet Take 1 tablet (8 mg total) by mouth every 8 (eight) hours as needed for nausea.  20 tablet  0  . promethazine (PHENERGAN) 25 MG tablet Take 1 tablet (25 mg total) by mouth every 6 (six) hours as needed for nausea.  30 tablet  0    Review of Systems  Constitutional: Negative for fever and malaise/fatigue.  HENT: Positive for ear pain, congestion and  sore throat.   Respiratory: Positive for cough, shortness of breath and wheezing. Negative for sputum production.   Gastrointestinal: Positive for nausea. Negative for vomiting and abdominal pain.  Genitourinary:       Neg - vaginal bleeding, discharge  Neurological: Positive for headaches.   Physical Exam   Blood pressure 118/78, pulse 76, temperature 98.7 F (37.1 C), temperature source Oral, resp. rate 20, height 5\' 3"  (1.6 m), weight 159 lb 12.8 oz (72.485 kg), last menstrual period 03/24/2013, SpO2 100.00%.  Physical Exam  Constitutional: She is oriented to person, place, and time. She appears well-developed and well-nourished. No distress.  HENT:  Head: Normocephalic and atraumatic.  Right Ear: Tympanic membrane, external ear and ear canal normal.  Left Ear: Tympanic membrane, external ear and ear canal normal.  Nose: Mucosal edema, rhinorrhea and sinus tenderness present. No epistaxis. Right sinus exhibits maxillary sinus tenderness and frontal sinus tenderness. Left sinus exhibits maxillary sinus tenderness and frontal sinus tenderness.  Mouth/Throat: Oropharyngeal exudate, posterior oropharyngeal edema and posterior oropharyngeal erythema present.  Cardiovascular: Normal rate, regular rhythm and normal heart sounds.   Respiratory: Effort normal and breath sounds normal. No  respiratory distress. She has no wheezes.  Lymphadenopathy:    She has cervical adenopathy.  Neurological: She is alert and oriented to person, place, and time.  Skin: Skin is warm and dry. No erythema.  Psychiatric: She has a normal mood and affect.    MAU Course  Procedures None  MDM Rapid Strep ordered Discussed with Dr. Erin Fulling. She came to MAU to evaluate the patient for tonsillar exudate vs abscess. Patient has tonsillar exudate. Recommends Rx for Amoxicillin and Reglan for "spitting" and nausea  Assessment and Plan  A: Throat infection Sinusitis  P: Discharge home Rx for Reglan and  Amoxicillin sent to patient's pharmacy Discussed OTC medications that are safe in pregnancy for symptom management Patient may return to MAU as needed or if her condition were to change or worsen, however discussed that [facility], [facility] or Urgent Care would also be available for re-evaluation  Freddi Starr, PA-C  05/07/2013, 1:57 PM

## 2013-05-07 NOTE — MAU Note (Signed)
Patient states she has had a sore throat for about one week. Feels throat is swollen and has difficulty swallowing. Has a cough. States she has had nausea, no vomiting, but states she started spitting when she arrived to MAU. Denies bleeding, abdominal pain or discharge.

## 2013-05-07 NOTE — MAU Provider Note (Signed)
This pt was seen and examined by me.  I agree with the above management and plan. Arseniy Toomey L. Harraway-Smith, M.D., Evern Core

## 2013-05-09 LAB — CULTURE, GROUP A STREP

## 2013-05-27 ENCOUNTER — Encounter (HOSPITAL_COMMUNITY): Payer: Self-pay | Admitting: *Deleted

## 2013-05-27 ENCOUNTER — Inpatient Hospital Stay (HOSPITAL_COMMUNITY)
Admission: AD | Admit: 2013-05-27 | Discharge: 2013-05-27 | Disposition: A | Discharge status: Home / Self Care | Payer: Self-pay | Source: Ambulatory Visit | Attending: Obstetrics & Gynecology | Admitting: Obstetrics & Gynecology

## 2013-05-27 ENCOUNTER — Inpatient Hospital Stay (HOSPITAL_COMMUNITY): Payer: Self-pay

## 2013-05-27 DIAGNOSIS — R112 Nausea with vomiting, unspecified: Secondary | ICD-10-CM

## 2013-05-27 DIAGNOSIS — O99891 Other specified diseases and conditions complicating pregnancy: Secondary | ICD-10-CM | POA: Insufficient documentation

## 2013-05-27 DIAGNOSIS — O210 Mild hyperemesis gravidarum: Secondary | ICD-10-CM | POA: Insufficient documentation

## 2013-05-27 DIAGNOSIS — R109 Unspecified abdominal pain: Secondary | ICD-10-CM | POA: Insufficient documentation

## 2013-05-27 DIAGNOSIS — O26899 Other specified pregnancy related conditions, unspecified trimester: Secondary | ICD-10-CM

## 2013-05-27 LAB — CBC
HCT: 38 % (ref 36.0–46.0)
Hemoglobin: 12.6 g/dL (ref 12.0–15.0)
MCH: 24.8 pg — ABNORMAL LOW (ref 26.0–34.0)
MCHC: 33.2 g/dL (ref 30.0–36.0)
MCV: 74.7 fL — ABNORMAL LOW (ref 78.0–100.0)
Platelets: 311 10*3/uL (ref 150–400)
RBC: 5.09 MIL/uL (ref 3.87–5.11)
RDW: 13.9 % (ref 11.5–15.5)
WBC: 9.6 10*3/uL (ref 4.0–10.5)

## 2013-05-27 LAB — URINALYSIS, ROUTINE W REFLEX MICROSCOPIC
Bilirubin Urine: NEGATIVE
Glucose, UA: NEGATIVE mg/dL
Hgb urine dipstick: NEGATIVE
Ketones, ur: 15 mg/dL — AB
Leukocytes, UA: NEGATIVE
Nitrite: NEGATIVE
Protein, ur: NEGATIVE mg/dL
Specific Gravity, Urine: >1.03 — ABNORMAL HIGH (ref 1.005–1.030)
Urobilinogen, UA: 1 mg/dL (ref 0.0–1.0)
pH: 6.5 (ref 5.0–8.0)

## 2013-05-27 LAB — WET PREP, GENITAL
Trich, Wet Prep: NONE SEEN
Yeast Wet Prep HPF POC: NONE SEEN

## 2013-05-27 MED ORDER — ONDANSETRON 8 MG PO TBDP
8.0000 mg | ORAL_TABLET | Freq: Once | ORAL | Status: AC
  Administered 2013-05-27: 8 mg via ORAL
  Filled 2013-05-27: qty 1

## 2013-05-27 MED ORDER — PROMETHAZINE HCL 25 MG PO TABS: 25.0000 mg | ORAL_TABLET | Freq: Four times a day (QID) | ORAL | Status: DC | PRN

## 2013-05-27 NOTE — MAU Note (Signed)
Patient states that she was seen in September and was suppose to stay for an ultrasound to confirm the pregnancy but she couldn't that day. She is in today with c/o intermittent "tightening" of her abdominal and nausea. Patient is spitting constantly. She states that she does not have any anti-emetic.

## 2013-05-27 NOTE — MAU Provider Note (Signed)
Attestation of Attending Supervision of Advanced Practitioner (PA/CNM/NP): Evaluation and management procedures were performed by the Advanced Practitioner under my supervision and collaboration.  I have reviewed the Advanced Practitioner's note and chart, and I agree with the management and plan.  Kalai Baca, MD, FACOG Attending Obstetrician & Gynecologist Faculty Practice, [facility]  

## 2013-05-27 NOTE — MAU Provider Note (Signed)
History     CSN: [id number]  Arrival date and time: 05/27/13 4098   First Provider Initiated Contact with Patient 05/27/13 2024      Chief Complaint  Patient presents with  . Abdominal Cramping  . Ptyalism  . Nausea   HPI Comments: Shelly Gibbs 27 y.o. [id number] presents to MAU for abdominal pains and nausea and vomiting in first trimester pregnancy. She was her on Sept 13 th with the same complaint and left prior to ultrasound. She plans her prenatal care ay [facility] but has not yet gotten her Medicaid.     Patient is a 27 y.o. female presenting with cramps.  Abdominal Cramping The primary symptoms of the illness include abdominal pain, nausea and vomiting.      Past Medical History  Diagnosis Date  . Asthma   . Chlamydia   . UTI (lower urinary tract infection)   . Yeast infection   . Headache(784.0)   . BV (bacterial vaginosis)   . Gonorrhea 08-03-2012    Past Surgical History  Procedure Laterality Date  . Induced abortion      x 2  . Dilation and curettage of uterus      Family History  Problem Relation Age of Onset  . Diabetes Maternal Aunt   . Anesthesia problems Neg Hx   . Other Neg Hx   . Hearing loss Neg Hx     History  Substance Use Topics  . Smoking status: Never Smoker   . Smokeless tobacco: Never Used  . Alcohol Use: Yes     Comment: occasional alcohol    Allergies:  Allergies  Allergen Reactions  . Latex Itching    Burning   . Tylenol [Acetaminophen] Hives    Prescriptions prior to admission  Medication Sig Dispense Refill  . metoCLOPramide (REGLAN) 10 MG tablet Take 1 tablet (10 mg total) by mouth every 6 (six) hours as needed.  30 tablet  0    Review of Systems  Constitutional: Negative.   HENT: Negative.   Eyes: Negative.   Gastrointestinal: Positive for nausea, vomiting and abdominal pain.  Genitourinary: Negative.   Musculoskeletal: Negative.   Skin: Negative.   Neurological: Negative.    Psychiatric/Behavioral: Negative.    Physical Exam   Blood pressure 122/92, pulse 78, temperature 98.6 F (37 C), temperature source Oral, resp. rate 18, height 5\' 3"  (1.6 m), weight 157 lb 2 oz (71.271 kg), last menstrual period 03/24/2013, SpO2 99.00%.  Physical Exam  Constitutional: She appears well-developed and well-nourished. No distress.  HENT:  Head: Normocephalic.  Eyes: Pupils are equal, round, and reactive to light.  GI: Soft. Bowel sounds are normal.  Genitourinary:  External: negative but for odor Vaginal: Thin white small amount odorous Cervix: Multip/ closed Biman: slight tenderness with palpation. approx 10 weeks  Musculoskeletal: Normal range of motion.  Neurological: She is alert.  Skin: Skin is warm.  Psychiatric: She has a normal mood and affect.   Results for orders placed during the hospital encounter of 05/27/13 (from the past 24 hour(s))  URINALYSIS, ROUTINE W REFLEX MICROSCOPIC     Status: Abnormal   Collection Time    05/27/13  7:39 PM      Result Value Range   Color, Urine YELLOW  YELLOW   APPearance CLEAR  CLEAR   Specific Gravity, Urine >1.030 (*) 1.005 - 1.030   pH 6.5  5.0 - 8.0   Glucose, UA NEGATIVE  NEGATIVE mg/dL   Hgb urine dipstick NEGATIVE  NEGATIVE   Bilirubin Urine NEGATIVE  NEGATIVE   Ketones, ur 15 (*) NEGATIVE mg/dL   Protein, ur NEGATIVE  NEGATIVE mg/dL   Urobilinogen, UA 1.0  0.0 - 1.0 mg/dL   Nitrite NEGATIVE  NEGATIVE   Leukocytes, UA NEGATIVE  NEGATIVE  WET PREP, GENITAL     Status: Abnormal   Collection Time    05/27/13  8:35 PM      Result Value Range   Yeast Wet Prep HPF POC NONE SEEN  NONE SEEN   Trich, Wet Prep NONE SEEN  NONE SEEN   Clue Cells Wet Prep HPF POC FEW (*) NONE SEEN   WBC, Wet Prep HPF POC FEW (*) NONE SEEN  CBC     Status: Abnormal   Collection Time    05/27/13  8:50 PM      Result Value Range   WBC 9.6  4.0 - 10.5 K/uL   RBC 5.09  3.87 - 5.11 MIL/uL   Hemoglobin 12.6  12.0 - 15.0 g/dL   HCT  78.2  95.6 - 21.3 %   MCV 74.7 (*) 78.0 - 100.0 fL   MCH 24.8 (*) 26.0 - 34.0 pg   MCHC 33.2  30.0 - 36.0 g/dL   RDW 08.6  57.8 - 46.9 %   Platelets 311  150 - 400 K/uL   US Ob Comp Less 14 Wks  05/27/2013   CLINICAL DATA:  Abdominal pain. Pregnant.  EXAM: OBSTETRIC <14 WK ULTRASOUND  TECHNIQUE: Transabdominal ultrasound was performed for evaluation of the gestation as well as the maternal uterus and adnexal regions.  COMPARISON:  08/1912  FINDINGS: Intrauterine gestational sac: Visualized/normal in shape.  Yolk sac:  Visualized  Embryo:  Well-formed embryo is visualized  Cardiac Activity: Cardiac activity detected  Heart Rate: 181 bpm  MSD:   mm    w     d  CRL: 2.3 cm 9 w 1 d [place] EDC: 12/29/13 concordant with the date of delivery based on the last menstrual cycle.  Maternal uterus/adnexae: No abnormalities. Normal ovaries. No pelvic free fluid.  IMPRESSION: Single live intrauterine pregnancy with the normal expected growth since the prior ultrasound. No abnormality seen. No emergent maternal finding.   Electronically Signed   By: Amie Portland M.D.   On: 05/27/2013 21:41     MAU Course  Procedures  MDM  Wet Prep, GC/Chlamydia, CBC, U/S  Assessment and Plan   A: Abdominal Pain in pregnancy Nausea/ vomiting  P: Rest, Reassurance, fluids Phenergan 25 mg q 6 hours   Carolynn Serve 05/27/2013, 8:24 PM

## 2013-05-28 LAB — GC/CHLAMYDIA PROBE AMP
CT Probe RNA: NEGATIVE
GC Probe RNA: NEGATIVE

## 2013-06-17 ENCOUNTER — Other Ambulatory Visit: Payer: Self-pay

## 2013-07-09 ENCOUNTER — Encounter (HOSPITAL_COMMUNITY): Payer: Self-pay | Admitting: *Deleted

## 2013-07-09 ENCOUNTER — Inpatient Hospital Stay (HOSPITAL_COMMUNITY)
Admission: AD | Admit: 2013-07-09 | Discharge: 2013-07-09 | Disposition: A | Discharge status: Home / Self Care | Payer: Medicaid Other | Source: Ambulatory Visit | Attending: Obstetrics and Gynecology | Admitting: Obstetrics and Gynecology

## 2013-07-09 DIAGNOSIS — E860 Dehydration: Secondary | ICD-10-CM | POA: Insufficient documentation

## 2013-07-09 DIAGNOSIS — R42 Dizziness and giddiness: Secondary | ICD-10-CM | POA: Insufficient documentation

## 2013-07-09 DIAGNOSIS — R112 Nausea with vomiting, unspecified: Secondary | ICD-10-CM | POA: Insufficient documentation

## 2013-07-09 DIAGNOSIS — R55 Syncope and collapse: Secondary | ICD-10-CM | POA: Insufficient documentation

## 2013-07-09 DIAGNOSIS — K117 Disturbances of salivary secretion: Secondary | ICD-10-CM | POA: Insufficient documentation

## 2013-07-09 LAB — URINALYSIS, ROUTINE W REFLEX MICROSCOPIC
Bilirubin Urine: NEGATIVE
Glucose, UA: NEGATIVE mg/dL
Hgb urine dipstick: NEGATIVE
Ketones, ur: 15 mg/dL — AB
Leukocytes, UA: NEGATIVE
Nitrite: NEGATIVE
Protein, ur: NEGATIVE mg/dL
Specific Gravity, Urine: 1.025 (ref 1.005–1.030)
Urobilinogen, UA: 1 mg/dL (ref 0.0–1.0)
pH: 7.5 (ref 5.0–8.0)

## 2013-07-09 MED ORDER — PROMETHAZINE HCL 25 MG PO TABS: 25.0000 mg | ORAL_TABLET | Freq: Four times a day (QID) | ORAL | Status: DC | PRN

## 2013-07-09 MED ORDER — PROMETHAZINE HCL 25 MG/ML IJ SOLN
Freq: Once | INTRAVENOUS | Status: AC
  Administered 2013-07-09: 17:00:00 via INTRAVENOUS
  Filled 2013-07-09: qty 1000

## 2013-07-09 NOTE — MAU Provider Note (Signed)
History     CSN: [id number]  Arrival date and time: 07/09/13 1448   First Provider Initiated Contact with Patient 07/09/13 1631      Chief Complaint  Patient presents with  . Loss of Consciousness  . Near Syncope  . Ptyalism   HPI Comments: Shelly Gibbs 27 y.o. [id number] presents to MAU with vertigo, nausea and near syncope. She has felt this way for several days. She has not established prenatal care but plans to go to [facility] when her Medicaid arrives. She does not have any nausea meds at home and would like to have some prescribed. + FHT today.   Loss of Consciousness Associated symptoms include dizziness, nausea and vomiting.      Past Medical History  Diagnosis Date  . Asthma   . Chlamydia   . UTI (lower urinary tract infection)   . Yeast infection   . Headache(784.0)   . BV (bacterial vaginosis)   . Gonorrhea 08-03-2012    Past Surgical History  Procedure Laterality Date  . Induced abortion      x 2  . Dilation and curettage of uterus      Family History  Problem Relation Age of Onset  . Diabetes Maternal Aunt   . Anesthesia problems Neg Hx   . Other Neg Hx   . Hearing loss Neg Hx     History  Substance Use Topics  . Smoking status: Never Smoker   . Smokeless tobacco: Never Used  . Alcohol Use: Yes     Comment: occasional alcohol    Allergies:  Allergies  Allergen Reactions  . Latex Itching    Burning   . Tylenol [Acetaminophen] Hives    No prescriptions prior to admission    Review of Systems  Constitutional: Negative.   HENT: Negative.   Eyes: Negative.   Respiratory: Negative.   Cardiovascular: Negative.   Gastrointestinal: Positive for nausea and vomiting.       Spitting  Genitourinary: Negative.   Musculoskeletal: Negative.   Skin: Negative.   Neurological: Positive for dizziness.  Psychiatric/Behavioral: The patient is nervous/anxious.    Physical Exam   Blood pressure 107/71, pulse 72, temperature 99.1 F (37.3 C),  temperature source Oral, resp. rate 18, height 5\' 2"  (1.575 m), weight 72.576 kg (160 lb), last menstrual period 03/24/2013.  Physical Exam  Constitutional: She is oriented to person, place, and time. She appears well-developed and well-nourished.  HENT:  Head: Normocephalic and atraumatic.  Eyes: Pupils are equal, round, and reactive to light.  Neck: Normal range of motion.  Cardiovascular: Normal rate, regular rhythm and normal heart sounds.   Respiratory: Effort normal and breath sounds normal. No respiratory distress. She has no wheezes.  GI: Soft. Bowel sounds are normal.  Neurological: She is alert and oriented to person, place, and time.  Skin: Skin is warm and dry.  Psychiatric:  Had argument with FOB and is upset   Results for orders placed during the hospital encounter of 07/09/13 (from the past 24 hour(s))  URINALYSIS, ROUTINE W REFLEX MICROSCOPIC     Status: Abnormal   Collection Time    07/09/13  3:45 PM      Result Value Range   Color, Urine YELLOW  YELLOW   APPearance CLOUDY (*) CLEAR   Specific Gravity, Urine 1.025  1.005 - 1.030   pH 7.5  5.0 - 8.0   Glucose, UA NEGATIVE  NEGATIVE mg/dL   Hgb urine dipstick NEGATIVE  NEGATIVE   Bilirubin  Urine NEGATIVE  NEGATIVE   Ketones, ur 15 (*) NEGATIVE mg/dL   Protein, ur NEGATIVE  NEGATIVE mg/dL   Urobilinogen, UA 1.0  0.0 - 1.0 mg/dL   Nitrite NEGATIVE  NEGATIVE   Leukocytes, UA NEGATIVE  NEGATIVE     MAU Course  Procedures  MDM  IVF with phenergan 25 mg  Assessment and Plan   A: Dehydration  P: IVF one liter today with phenergan Phenergan 25 mg q6 hours prn nausea # 30/ 1 refill Establish care at [facility] 07/09/2013, 6:22 PM

## 2013-07-09 NOTE — MAU Note (Signed)
Pt drove self. Pt cannot find ride home. Will continue to monitor pt until she can find a ride

## 2013-07-09 NOTE — MAU Note (Signed)
Pt unable to find ride home. Pt refused taxi cab or bus pass. Advised pt to stay for monitoring. Pt does not want to stay any longer. Pt says she lives 5 mins away. Talked with L Barefoot NP-says pt is coherent and can walk a straight line and pt may leave.

## 2013-07-10 NOTE — MAU Provider Note (Signed)
Attestation of Attending Supervision of Advanced Practitioner: Evaluation and management procedures were performed by the PA/NP/CNM/OB Fellow under my supervision/collaboration. Chart reviewed and agree with management and plan.  Kevonna Nolte V 07/10/2013 10:08 PM

## 2013-08-01 ENCOUNTER — Encounter (HOSPITAL_COMMUNITY): Payer: Self-pay | Admitting: Family

## 2013-08-01 ENCOUNTER — Inpatient Hospital Stay (HOSPITAL_COMMUNITY)
Admission: AD | Admit: 2013-08-01 | Discharge: 2013-08-01 | Disposition: A | Discharge status: Home / Self Care | Payer: Medicaid Other | Source: Ambulatory Visit | Attending: Obstetrics and Gynecology | Admitting: Obstetrics and Gynecology

## 2013-08-01 DIAGNOSIS — N760 Acute vaginitis: Secondary | ICD-10-CM | POA: Insufficient documentation

## 2013-08-01 DIAGNOSIS — O0930 Supervision of pregnancy with insufficient antenatal care, unspecified trimester: Secondary | ICD-10-CM | POA: Insufficient documentation

## 2013-08-01 DIAGNOSIS — A499 Bacterial infection, unspecified: Secondary | ICD-10-CM | POA: Insufficient documentation

## 2013-08-01 DIAGNOSIS — B9689 Other specified bacterial agents as the cause of diseases classified elsewhere: Secondary | ICD-10-CM | POA: Insufficient documentation

## 2013-08-01 DIAGNOSIS — O2390 Unspecified genitourinary tract infection in pregnancy, unspecified trimester: Secondary | ICD-10-CM | POA: Insufficient documentation

## 2013-08-01 DIAGNOSIS — N949 Unspecified condition associated with female genital organs and menstrual cycle: Secondary | ICD-10-CM

## 2013-08-01 DIAGNOSIS — O26899 Other specified pregnancy related conditions, unspecified trimester: Secondary | ICD-10-CM

## 2013-08-01 DIAGNOSIS — R109 Unspecified abdominal pain: Secondary | ICD-10-CM | POA: Insufficient documentation

## 2013-08-01 LAB — WET PREP, GENITAL
Trich, Wet Prep: NONE SEEN
Yeast Wet Prep HPF POC: NONE SEEN

## 2013-08-01 LAB — URINALYSIS, ROUTINE W REFLEX MICROSCOPIC
Bilirubin Urine: NEGATIVE
Glucose, UA: NEGATIVE mg/dL
Hgb urine dipstick: NEGATIVE
Ketones, ur: NEGATIVE mg/dL
Nitrite: NEGATIVE
Protein, ur: NEGATIVE mg/dL
Specific Gravity, Urine: 1.02 (ref 1.005–1.030)
Urobilinogen, UA: 1 mg/dL (ref 0.0–1.0)
pH: 7.5 (ref 5.0–8.0)

## 2013-08-01 LAB — URINE MICROSCOPIC-ADD ON

## 2013-08-01 MED ORDER — METRONIDAZOLE 0.75 % VA GEL: 1.0000 | Freq: Two times a day (BID) | VAGINAL | Status: DC

## 2013-08-01 MED ORDER — METRONIDAZOLE 500 MG PO TABS: 500.0000 mg | ORAL_TABLET | Freq: Two times a day (BID) | ORAL | Status: DC

## 2013-08-01 NOTE — MAU Provider Note (Signed)
History     CSN: [id number]  Arrival date and time: 08/01/13 0944   None     Chief Complaint  Patient presents with  . Abdominal Pain  . Vaginal Bleeding   HPI  [name] is a 27 y.o. female [phone] at [redacted]w[redacted]d who presents with abdominal pain and vaginal discharge. The discharge is brownish/redish and has been going on since 0400 this morning. Last intercourse was a week ago. The abdominal pain is in her lower abdomen; both sides, comes and goes. The pain seems to worsen with movement and with change in position.  First appointment with Dr. Henderson Cloud is January 21st; pt has had minimal prenatal care.   OB History   Grav Para Term Preterm Abortions TAB SAB Ect Mult Living   9 1 1  0 7 5 1  0 0 1      Past Medical History  Diagnosis Date  . Asthma   . Chlamydia   . UTI (lower urinary tract infection)   . Yeast infection   . Headache(784.0)   . BV (bacterial vaginosis)   . Gonorrhea 08-03-2012    Past Surgical History  Procedure Laterality Date  . Induced abortion      x 2  . Dilation and curettage of uterus      Family History  Problem Relation Age of Onset  . Diabetes Maternal Aunt   . Anesthesia problems Neg Hx   . Other Neg Hx   . Hearing loss Neg Hx     History  Substance Use Topics  . Smoking status: Never Smoker   . Smokeless tobacco: Never Used  . Alcohol Use: Yes     Comment: occasional alcohol    Allergies:  Allergies  Allergen Reactions  . Latex Itching    Burning   . Tylenol [Acetaminophen] Hives    No prescriptions prior to admission   Results for orders placed during the hospital encounter of 08/01/13 (from the past 24 hour(s))  URINALYSIS, ROUTINE W REFLEX MICROSCOPIC     Status: Abnormal   Collection Time    08/01/13  9:56 AM      Result Value Range   Color, Urine YELLOW  YELLOW   APPearance HAZY (*) CLEAR   Specific Gravity, Urine 1.020  1.005 - 1.030   pH 7.5  5.0 - 8.0   Glucose, UA NEGATIVE  NEGATIVE mg/dL   Hgb  urine dipstick NEGATIVE  NEGATIVE   Bilirubin Urine NEGATIVE  NEGATIVE   Ketones, ur NEGATIVE  NEGATIVE mg/dL   Protein, ur NEGATIVE  NEGATIVE mg/dL   Urobilinogen, UA 1.0  0.0 - 1.0 mg/dL   Nitrite NEGATIVE  NEGATIVE   Leukocytes, UA SMALL (*) NEGATIVE  URINE MICROSCOPIC-ADD ON     Status: Abnormal   Collection Time    08/01/13  9:56 AM      Result Value Range   Squamous Epithelial / LPF MANY (*) RARE   WBC, UA 7-10  <3 WBC/hpf   RBC / HPF 0-2  <3 RBC/hpf   Bacteria, UA RARE  RARE   Urine-Other MUCOUS PRESENT    WET PREP, GENITAL     Status: Abnormal   Collection Time    08/01/13 11:05 AM      Result Value Range   Yeast Wet Prep HPF POC NONE SEEN  NONE SEEN   Trich, Wet Prep NONE SEEN  NONE SEEN   Clue Cells Wet Prep HPF POC MANY (*) NONE SEEN   WBC, Wet  Prep HPF POC MODERATE (*) NONE SEEN    Review of Systems  Constitutional: Negative for fever.  Gastrointestinal: Positive for abdominal pain and constipation. Negative for nausea, vomiting and diarrhea.  Genitourinary: Negative for dysuria, urgency, frequency and hematuria.       + vaginal discharge pink/brown No vaginal bleeding. No dysuria.    Physical Exam   Blood pressure 123/77, pulse 78, temperature 98.4 F (36.9 C), temperature source Oral, resp. rate 16, last menstrual period 03/24/2013.  Physical Exam  Constitutional: She is oriented to person, place, and time. She appears well-developed and well-nourished. No distress.  HENT:  Head: Normocephalic.  Eyes: Pupils are equal, round, and reactive to light.  Neck: Neck supple.  Respiratory: Effort normal.  GI: Soft. She exhibits no distension. There is no tenderness. There is no rebound.  Genitourinary: Vaginal discharge found.  Speculum exam: Vagina - moderate amount of creamy discharge, mild odor Cervix - No contact bleeding, no active bleeding  Moderate amount of stool on rectum  Bimanual exam: Cervix closed, no CMT  Uterus non tender, normal size;  enlarged  Bilateral adnexal tenderness, no masses bilaterally GC/Chlam, wet prep done Chaperone present for exam.   Musculoskeletal: Normal range of motion.  Neurological: She is alert and oriented to person, place, and time.  Skin: Skin is warm. She is not diaphoretic.  Psychiatric: Her behavior is normal.    MAU Course  Procedures None  MDM +fht Wet prep Gc/chlamydia  UA Consulted with Dr. Henderson Cloud.   Assessment and Plan   A:  1. Round ligament pain   2. BV (bacterial vaginosis)   3. Abdominal pain in pregnancy     P: Discharge home in stable condition RX: metrogel; pt prefers over flagyl, she will fill flagyl if cost is high with metrogel.  Pregnancy support belt recommended  Change positions slowly Keep your appointment with OB.  Keil Pickering IRENE NP  08/01/2013, 10:55 AM

## 2013-08-01 NOTE — MAU Note (Signed)
27 yo, G9P1 at [redacted]w[redacted]d, presents to MAU with c/o vaginal bleeding at 0400 today. Reports she did not soak a pad, nor is she bleeding now except with wiping. Reports bilateral lower abdominal cramping since that time.  First appt is scheduled for 1/21.

## 2013-08-02 LAB — GC/CHLAMYDIA PROBE AMP
CT Probe RNA: NEGATIVE
GC Probe RNA: NEGATIVE

## 2013-08-12 NOTE — L&D Delivery Note (Signed)
Patient was C/C/+1 and pushed for approx 20 minutes with epidural.   NSVD  female infant, Apgars 9/9, weight pending.   The patient had a 1st degree lac repaired with 3-0 vicryl. Fundus was firm. EBL was expected. Placenta was delivered intact. Vagina was clear.  Baby was vigorous and doing skin to skin with mother.  Shelly Gibbs

## 2013-09-01 ENCOUNTER — Other Ambulatory Visit: Payer: Self-pay | Admitting: Obstetrics and Gynecology

## 2013-09-01 LAB — OB RESULTS CONSOLE ABO/RH: RH Type: POSITIVE

## 2013-09-01 LAB — OB RESULTS CONSOLE GC/CHLAMYDIA
Chlamydia: NEGATIVE
Gonorrhea: NEGATIVE

## 2013-09-01 LAB — OB RESULTS CONSOLE RUBELLA ANTIBODY, IGM: Rubella: IMMUNE

## 2013-09-01 LAB — OB RESULTS CONSOLE HIV ANTIBODY (ROUTINE TESTING): HIV: NONREACTIVE

## 2013-09-01 LAB — OB RESULTS CONSOLE HEPATITIS B SURFACE ANTIGEN: Hepatitis B Surface Ag: NEGATIVE

## 2013-09-01 LAB — OB RESULTS CONSOLE ANTIBODY SCREEN: Antibody Screen: NEGATIVE

## 2013-09-29 LAB — OB RESULTS CONSOLE RPR: RPR: NONREACTIVE

## 2013-11-17 ENCOUNTER — Encounter (HOSPITAL_COMMUNITY): Payer: Self-pay | Admitting: *Deleted

## 2013-11-17 ENCOUNTER — Inpatient Hospital Stay (HOSPITAL_COMMUNITY)
Admission: AD | Admit: 2013-11-17 | Discharge: 2013-11-17 | Disposition: A | Discharge status: Home / Self Care | Payer: Medicaid Other | Source: Ambulatory Visit | Attending: Obstetrics and Gynecology | Admitting: Obstetrics and Gynecology

## 2013-11-17 DIAGNOSIS — O4700 False labor before 37 completed weeks of gestation, unspecified trimester: Secondary | ICD-10-CM | POA: Insufficient documentation

## 2013-11-17 MED ORDER — NIFEDIPINE 10 MG PO CAPS
10.0000 mg | ORAL_CAPSULE | Freq: Once | ORAL | Status: AC
  Administered 2013-11-17: 10 mg via ORAL
  Filled 2013-11-17: qty 1

## 2013-11-17 MED ORDER — TERBUTALINE SULFATE 1 MG/ML IJ SOLN
0.2500 mg | Freq: Once | INTRAMUSCULAR | Status: AC
  Administered 2013-11-17: 0.25 mg via SUBCUTANEOUS
  Filled 2013-11-17: qty 1

## 2013-11-17 MED ORDER — LACTATED RINGERS IV BOLUS (SEPSIS)
1000.0000 mL | Freq: Once | INTRAVENOUS | Status: AC
  Administered 2013-11-17: 1000 mL via INTRAVENOUS

## 2013-11-17 NOTE — Progress Notes (Signed)
Rx called in per order Dr. Dareen PianoAnderson: Procardia 10mg  #50, 1 four times per day, no refills. [facility] [place].

## 2013-11-17 NOTE — MAU Note (Addendum)
Sent from office. 3cm, contracting. Orders called in by Dr. Dareen PianoAnderson. He states no need for advanced practice provider to see patient. Informed him NP sees all patients under 36 wks at least for medical screen.

## 2013-11-17 NOTE — Discharge Instructions (Signed)
Keep your scheduled appointment for prenatal care. Reduce your activity for the next 2 weeks. Rest on your side frequently during the day. No exercise or strenuous housework.  Drink 8-10 glasses of water per day. No intercourse or sexual activity of any kind. Take the Procardia (Nifedipine) as directed for 2 weeks.

## 2013-11-24 LAB — OB RESULTS CONSOLE GBS: GBS: NEGATIVE

## 2013-12-07 ENCOUNTER — Encounter (HOSPITAL_COMMUNITY): Payer: Self-pay | Admitting: *Deleted

## 2013-12-07 ENCOUNTER — Observation Stay (HOSPITAL_COMMUNITY)
Admission: AD | Admit: 2013-12-07 | Discharge: 2013-12-08 | Disposition: A | Discharge status: Home / Self Care | Payer: Medicaid Other | Source: Ambulatory Visit | Attending: Obstetrics and Gynecology | Admitting: Obstetrics and Gynecology

## 2013-12-07 DIAGNOSIS — Z3490 Encounter for supervision of normal pregnancy, unspecified, unspecified trimester: Secondary | ICD-10-CM

## 2013-12-07 DIAGNOSIS — O4700 False labor before 37 completed weeks of gestation, unspecified trimester: Principal | ICD-10-CM | POA: Insufficient documentation

## 2013-12-07 HISTORY — DX: Encounter for supervision of normal pregnancy, unspecified, unspecified trimester: Z34.90

## 2013-12-07 MED ORDER — ZOLPIDEM TARTRATE 5 MG PO TABS
5.0000 mg | ORAL_TABLET | Freq: Every evening | ORAL | Status: DC | PRN
  Administered 2013-12-08: 5 mg via ORAL
  Filled 2013-12-07: qty 1

## 2013-12-07 MED ORDER — NIFEDIPINE 10 MG PO CAPS
10.0000 mg | ORAL_CAPSULE | Freq: Once | ORAL | Status: AC
  Administered 2013-12-08: 10 mg via ORAL
  Filled 2013-12-07: qty 1

## 2013-12-07 MED ORDER — NIFEDIPINE 10 MG PO CAPS
10.0000 mg | ORAL_CAPSULE | Freq: Once | ORAL | Status: AC
  Administered 2013-12-07: 10 mg via ORAL
  Filled 2013-12-07: qty 1

## 2013-12-07 NOTE — MAU Note (Signed)
Pt to room 170 for evaluation for labor.

## 2013-12-07 NOTE — MAU Note (Signed)
Patient presents for contractions that started today at 1930.  Patient reports positive fetal movement and denies vaginal bleeding and leakage of fluid.

## 2013-12-07 NOTE — MAU Note (Signed)
Pt G7 P1 at 36.6wks having contractions back to back. Denies bleeding or leaking.  Hx PTL.

## 2013-12-08 NOTE — Progress Notes (Signed)
Discharge instructions given, pt verbalizes and understands, pt dc'd to home.

## 2013-12-08 NOTE — Discharge Summary (Signed)
  Pt is a 28 yr old black female at 37 weeks who was admitted for observation for contractions. In the am she was not having contractions and her cx had not changed. She had a reactive NST.

## 2013-12-20 ENCOUNTER — Inpatient Hospital Stay (HOSPITAL_COMMUNITY): Payer: Medicaid Other | Admitting: Anesthesiology

## 2013-12-20 ENCOUNTER — Encounter (HOSPITAL_COMMUNITY): Payer: Self-pay | Admitting: *Deleted

## 2013-12-20 ENCOUNTER — Encounter (HOSPITAL_COMMUNITY): Payer: Medicaid Other | Admitting: Anesthesiology

## 2013-12-20 ENCOUNTER — Inpatient Hospital Stay (HOSPITAL_COMMUNITY)
Admission: AD | Admit: 2013-12-20 | Discharge: 2013-12-22 | DRG: 775 | Disposition: A | Discharge status: Home / Self Care | Payer: Medicaid Other | Source: Ambulatory Visit | Attending: Obstetrics and Gynecology | Admitting: Obstetrics and Gynecology

## 2013-12-20 DIAGNOSIS — Z3490 Encounter for supervision of normal pregnancy, unspecified, unspecified trimester: Secondary | ICD-10-CM

## 2013-12-20 DIAGNOSIS — B9689 Other specified bacterial agents as the cause of diseases classified elsewhere: Secondary | ICD-10-CM

## 2013-12-20 DIAGNOSIS — O9989 Other specified diseases and conditions complicating pregnancy, childbirth and the puerperium: Secondary | ICD-10-CM

## 2013-12-20 DIAGNOSIS — O99892 Other specified diseases and conditions complicating childbirth: Secondary | ICD-10-CM | POA: Diagnosis present

## 2013-12-20 DIAGNOSIS — J45909 Unspecified asthma, uncomplicated: Secondary | ICD-10-CM | POA: Diagnosis present

## 2013-12-20 DIAGNOSIS — N760 Acute vaginitis: Secondary | ICD-10-CM

## 2013-12-20 DIAGNOSIS — L0100 Impetigo, unspecified: Secondary | ICD-10-CM

## 2013-12-20 LAB — RPR

## 2013-12-20 LAB — CBC
HCT: 34 % — ABNORMAL LOW (ref 36.0–46.0)
Hemoglobin: 10.8 g/dL — ABNORMAL LOW (ref 12.0–15.0)
MCH: 23.7 pg — ABNORMAL LOW (ref 26.0–34.0)
MCHC: 31.8 g/dL (ref 30.0–36.0)
MCV: 74.7 fL — ABNORMAL LOW (ref 78.0–100.0)
Platelets: 276 10*3/uL (ref 150–400)
RBC: 4.55 MIL/uL (ref 3.87–5.11)
RDW: 15.1 % (ref 11.5–15.5)
WBC: 9.4 10*3/uL (ref 4.0–10.5)

## 2013-12-20 MED ORDER — DIPHENHYDRAMINE HCL 50 MG/ML IJ SOLN: 12.5000 mg | INTRAMUSCULAR | Status: DC | PRN

## 2013-12-20 MED ORDER — FENTANYL 2.5 MCG/ML BUPIVACAINE 1/10 % EPIDURAL INFUSION (WH - ANES): 14.0000 mL/h | INTRAMUSCULAR | Status: DC | PRN

## 2013-12-20 MED ORDER — EPHEDRINE 5 MG/ML INJ
10.0000 mg | INTRAVENOUS | Status: DC | PRN
  Filled 2013-12-20: qty 2

## 2013-12-20 MED ORDER — SODIUM BICARBONATE 8.4 % IV SOLN
INTRAVENOUS | Status: DC | PRN
  Administered 2013-12-20: 5 mL via EPIDURAL

## 2013-12-20 MED ORDER — EPHEDRINE 5 MG/ML INJ
10.0000 mg | INTRAVENOUS | Status: DC | PRN
  Filled 2013-12-20: qty 4
  Filled 2013-12-20: qty 2

## 2013-12-20 MED ORDER — CITRIC ACID-SODIUM CITRATE 334-500 MG/5ML PO SOLN: 30.0000 mL | ORAL | Status: DC | PRN

## 2013-12-20 MED ORDER — PRENATAL MULTIVITAMIN CH
1.0000 | ORAL_TABLET | Freq: Every day | ORAL | Status: DC
  Administered 2013-12-21 – 2013-12-22 (×2): 1 via ORAL
  Filled 2013-12-20 (×2): qty 1

## 2013-12-20 MED ORDER — DIPHENHYDRAMINE HCL 25 MG PO CAPS: 25.0000 mg | ORAL_CAPSULE | Freq: Four times a day (QID) | ORAL | Status: DC | PRN

## 2013-12-20 MED ORDER — PHENYLEPHRINE 40 MCG/ML (10ML) SYRINGE FOR IV PUSH (FOR BLOOD PRESSURE SUPPORT)
80.0000 ug | PREFILLED_SYRINGE | INTRAVENOUS | Status: DC | PRN
Start: 2013-12-20 — End: 2013-12-20
  Filled 2013-12-20: qty 2

## 2013-12-20 MED ORDER — OXYTOCIN BOLUS FROM INFUSION
500.0000 mL | INTRAVENOUS | Status: DC
Start: 2013-12-20 — End: 2013-12-20

## 2013-12-20 MED ORDER — ONDANSETRON HCL 4 MG/2ML IJ SOLN: 4.0000 mg | INTRAMUSCULAR | Status: DC | PRN

## 2013-12-20 MED ORDER — OXYTOCIN 40 UNITS IN LACTATED RINGERS INFUSION - SIMPLE MED
62.5000 mL/h | INTRAVENOUS | Status: DC
  Administered 2013-12-20: 62.5 mL/h via INTRAVENOUS
  Filled 2013-12-20: qty 1000

## 2013-12-20 MED ORDER — LACTATED RINGERS IV SOLN
INTRAVENOUS | Status: DC
  Administered 2013-12-20 (×2): via INTRAVENOUS

## 2013-12-20 MED ORDER — IBUPROFEN 600 MG PO TABS
600.0000 mg | ORAL_TABLET | Freq: Four times a day (QID) | ORAL | Status: DC
  Administered 2013-12-21 – 2013-12-22 (×6): 600 mg via ORAL
  Filled 2013-12-20 (×7): qty 1

## 2013-12-20 MED ORDER — IBUPROFEN 600 MG PO TABS
600.0000 mg | ORAL_TABLET | Freq: Four times a day (QID) | ORAL | Status: DC | PRN
  Administered 2013-12-20: 600 mg via ORAL
  Filled 2013-12-20: qty 1

## 2013-12-20 MED ORDER — LACTATED RINGERS IV SOLN: 500.0000 mL | INTRAVENOUS | Status: DC | PRN

## 2013-12-20 MED ORDER — ONDANSETRON HCL 4 MG PO TABS
4.0000 mg | ORAL_TABLET | ORAL | Status: DC | PRN
  Administered 2013-12-21: 4 mg via ORAL
  Filled 2013-12-20: qty 1

## 2013-12-20 MED ORDER — SENNOSIDES-DOCUSATE SODIUM 8.6-50 MG PO TABS
2.0000 | ORAL_TABLET | ORAL | Status: DC
  Administered 2013-12-21 (×2): 2 via ORAL
  Filled 2013-12-20 (×2): qty 2

## 2013-12-20 MED ORDER — ONDANSETRON HCL 4 MG/2ML IJ SOLN: 4.0000 mg | Freq: Four times a day (QID) | INTRAMUSCULAR | Status: DC | PRN

## 2013-12-20 MED ORDER — LACTATED RINGERS IV SOLN
500.0000 mL | Freq: Once | INTRAVENOUS | Status: AC
  Administered 2013-12-20: 1000 mL via INTRAVENOUS

## 2013-12-20 MED ORDER — LANOLIN HYDROUS EX OINT: TOPICAL_OINTMENT | CUTANEOUS | Status: DC | PRN

## 2013-12-20 MED ORDER — LIDOCAINE HCL (PF) 1 % IJ SOLN
30.0000 mL | INTRAMUSCULAR | Status: DC | PRN
  Administered 2013-12-20: 30 mL via SUBCUTANEOUS
  Filled 2013-12-20: qty 30

## 2013-12-20 MED ORDER — FENTANYL 2.5 MCG/ML BUPIVACAINE 1/10 % EPIDURAL INFUSION (WH - ANES)
14.0000 mL/h | INTRAMUSCULAR | Status: DC | PRN
  Administered 2013-12-20: 14 mL/h via EPIDURAL
  Filled 2013-12-20: qty 125

## 2013-12-20 MED ORDER — DIBUCAINE 1 % RE OINT: 1.0000 "application " | TOPICAL_OINTMENT | RECTAL | Status: DC | PRN

## 2013-12-20 MED ORDER — OXYCODONE-ACETAMINOPHEN 5-325 MG PO TABS: 1.0000 | ORAL_TABLET | ORAL | Status: DC | PRN

## 2013-12-20 MED ORDER — FLEET ENEMA 7-19 GM/118ML RE ENEM: 1.0000 | ENEMA | RECTAL | Status: DC | PRN

## 2013-12-20 MED ORDER — WITCH HAZEL-GLYCERIN EX PADS
1.0000 "application " | MEDICATED_PAD | CUTANEOUS | Status: DC | PRN
  Administered 2013-12-20: 1 via TOPICAL

## 2013-12-20 MED ORDER — BENZOCAINE-MENTHOL 20-0.5 % EX AERO
1.0000 "application " | INHALATION_SPRAY | CUTANEOUS | Status: DC | PRN
  Administered 2013-12-21: 1 via TOPICAL
  Filled 2013-12-20: qty 56

## 2013-12-20 MED ORDER — PHENYLEPHRINE 40 MCG/ML (10ML) SYRINGE FOR IV PUSH (FOR BLOOD PRESSURE SUPPORT)
80.0000 ug | PREFILLED_SYRINGE | INTRAVENOUS | Status: DC | PRN
  Filled 2013-12-20: qty 2
  Filled 2013-12-20: qty 10

## 2013-12-20 MED ORDER — SIMETHICONE 80 MG PO CHEW: 80.0000 mg | CHEWABLE_TABLET | ORAL | Status: DC | PRN

## 2013-12-20 MED ORDER — ZOLPIDEM TARTRATE 5 MG PO TABS: 5.0000 mg | ORAL_TABLET | Freq: Every evening | ORAL | Status: DC | PRN

## 2013-12-20 MED ORDER — TETANUS-DIPHTH-ACELL PERTUSSIS 5-2.5-18.5 LF-MCG/0.5 IM SUSP: 0.5000 mL | Freq: Once | INTRAMUSCULAR | Status: DC

## 2013-12-20 NOTE — Anesthesia Preprocedure Evaluation (Signed)

## 2013-12-20 NOTE — MAU Note (Signed)
Patient states she is having contractions every 5-6 minutes. Denies bleeding or leaking and reports good fetal movement. States she did have a heavier vaginal discharge last night.

## 2013-12-20 NOTE — H&P (Addendum)
Late entry 28 y.o. 504w5d  [id number] comes in c/o ctx.  Otherwise has good fetal movement and no bleeding.  Past Medical History  Diagnosis Date  . Chlamydia   . UTI (lower urinary tract infection)   . Yeast infection   . Headache(784.0)   . BV (bacterial vaginosis)   . Gonorrhea 08-03-2012  . Asthma     inhaler last used "long time ago"    Past Surgical History  Procedure Laterality Date  . Induced abortion      x 2  . Dilation and curettage of uterus      OB History  Gravida Para Term Preterm AB SAB TAB Ectopic Multiple Living  7 2 2  0 5 1 4  0 0 2    # Outcome Date GA Lbr Len/2nd Weight Sex Delivery Anes PTL Lv  7 TRM 12/20/13 584w5d 07:42 / 00:11 2.64 kg (5 lb 13.1 oz) F SVD EPI  Y     Comments: none  6 SAB           5 TAB  3463w0d         4 TRM      SVD     3 TAB           2 TAB           1 TAB               History   Social History  . Marital Status: Single    Spouse Name: N/A    Number of Children: N/A  . Years of Education: N/A   Occupational History  . Not on file.   Social History Main Topics  . Smoking status: Never Smoker   . Smokeless tobacco: Never Used  . Alcohol Use: Yes     Comment: occasional alcohol  . Drug Use: No  . Sexual Activity: Yes    Birth Control/ Protection: None   Other Topics Concern  . Not on file   Social History Narrative  . No narrative on file   Latex and Tylenol    Prenatal Transfer Tool  Maternal Diabetes: No Genetic Screening: Normal Maternal Ultrasounds/Referrals: Normal - Left renal pyelectasis, resolved on subsquent scan Fetal Ultrasounds or other Referrals:  None Maternal Substance Abuse:  No Significant Maternal Medications:  None Significant Maternal Lab Results: Lab values include: Group B Strep negative  Other PNC: uncomplicated.    Filed Vitals:   12/20/13 1929  BP: 117/79  Pulse: 64  Temp:   Resp: 20     Lungs/Cor:  NAD Abdomen:  soft, gravid Ex:  no cords, erythema SVE:  4-5cm bulging  bag FHTs:  140, good STV, NST R Toco:  q2-5   A/P  Admit in labor  GBS Neg  AROM'd for thin mec after admission  Philip AspenSidney Hulan Szumski

## 2013-12-20 NOTE — Anesthesia Procedure Notes (Signed)

## 2013-12-21 LAB — CBC
HCT: 31.2 % — ABNORMAL LOW (ref 36.0–46.0)
Hemoglobin: 9.9 g/dL — ABNORMAL LOW (ref 12.0–15.0)
MCH: 23.7 pg — ABNORMAL LOW (ref 26.0–34.0)
MCHC: 31.7 g/dL (ref 30.0–36.0)
MCV: 74.8 fL — ABNORMAL LOW (ref 78.0–100.0)
Platelets: 210 10*3/uL (ref 150–400)
RBC: 4.17 MIL/uL (ref 3.87–5.11)
RDW: 15.1 % (ref 11.5–15.5)
WBC: 12 10*3/uL — ABNORMAL HIGH (ref 4.0–10.5)

## 2013-12-21 MED ORDER — OXYCODONE-ACETAMINOPHEN 5-325 MG PO TABS
1.0000 | ORAL_TABLET | ORAL | Status: DC | PRN
  Administered 2013-12-21: 2 via ORAL
  Administered 2013-12-21 (×2): 1 via ORAL
  Filled 2013-12-21: qty 2
  Filled 2013-12-21: qty 1

## 2013-12-21 MED ORDER — HYDROMORPHONE HCL 2 MG PO TABS
1.0000 mg | ORAL_TABLET | ORAL | Status: DC | PRN
  Administered 2013-12-21 (×2): 1 mg via ORAL
  Filled 2013-12-21 (×2): qty 1

## 2013-12-21 NOTE — Addendum Note (Signed)
Addendum created 12/21/13 0935 by Graciela HusbandsWynn O Heather Mckendree, CRNA   Modules edited: Charges VN, Notes Section   Notes Section:  File: [id number]

## 2013-12-21 NOTE — Progress Notes (Signed)
PPD#1 Pt without complaints. Lochia mild. VSSAF IMP/ Doing Well Plan/ Routine care.

## 2013-12-21 NOTE — Anesthesia Postprocedure Evaluation (Signed)
Anesthesia Post Note  Patient: Shelly Gibbs  Procedure(s) Performed: * No procedures listed *  Anesthesia type: Epidural  Patient location: Mother/Baby  Post pain: Pain level controlled  Post assessment: Post-op Vital signs reviewed  Last Vitals:  Filed Vitals:   12/21/13 0630  BP: 131/86  Pulse: 77  Temp: 36.7 C  Resp: 18    Post vital signs: Reviewed  Level of consciousness:alert  Complications: No apparent anesthesia complications

## 2013-12-21 NOTE — Lactation Note (Signed)
This note was copied from the chart of Shelly Gibbs. Lactation Consultation Note  Patient Name: Shelly Jeannetta EllisBritney Albrecht ZOXWR'UToday's Date: 12/21/2013 Reason for consult: Other (Comment) (charting for exclusion)   Maternal Data Formula Feeding for Exclusion: Yes Reason for exclusion: Mother's choice to formula feed on admision  Feeding Feeding Type: Formula Nipple Type: Slow - flow  LATCH Score/Interventions                      Lactation Tools Discussed/Used     Consult Status Consult Status: Complete    Zara ChessJoanne P Catalino Plascencia 12/21/2013, 6:25 PM

## 2013-12-21 NOTE — Progress Notes (Signed)
UR chart review completed.  

## 2013-12-22 MED ORDER — IBUPROFEN 600 MG PO TABS: 600.0000 mg | ORAL_TABLET | Freq: Four times a day (QID) | ORAL | Status: DC | PRN

## 2013-12-22 MED ORDER — ZOLPIDEM TARTRATE 5 MG PO TABS: 5.0000 mg | ORAL_TABLET | Freq: Every evening | ORAL | Status: DC | PRN

## 2013-12-22 NOTE — Discharge Summary (Signed)
Obstetric Discharge Summary Reason for Admission: onset of labor Prenatal Procedures: none Intrapartum Procedures: spontaneous vaginal delivery Postpartum Procedures: none Complications-Operative and Postpartum: none Hemoglobin  Date Value Ref Range Status  12/21/2013 9.9* 12.0 - 15.0 g/dL Final     HCT  Date Value Ref Range Status  12/21/2013 31.2* 36.0 - 46.0 % Final    Physical Exam:  General: alert, cooperative and no distress Lochia: appropriate Uterine Fundus: firm  DVT Evaluation: No evidence of DVT seen on physical exam. Negative Homan's sign. No cords or calf tenderness.  Discharge Diagnoses: Term Pregnancy-delivered  Discharge Information: Date: 12/22/2013 Activity: pelvic rest Diet: routine Medications: PNV and Ibuprofen Condition: stable Instructions: refer to practice specific booklet Discharge to: home   Newborn Data: Live born female  Birth Weight: 5 lb 13.1 oz (2640 g) APGAR: 9, 9  Home with mother.  Shelly Gibbs 12/22/2013, 11:41 AM

## 2013-12-22 NOTE — Progress Notes (Signed)
Post Partum Day 2 Subjective: no complaints, up ad lib, voiding, tolerating PO and + flatus  Objective: Blood pressure 113/77, pulse 58, temperature 98.2 F (36.8 C), temperature source Oral, resp. rate 18, height 5\' 2"  (1.575 m), weight 82.918 kg (182 lb 12.8 oz), last menstrual period 03/24/2013, SpO2 100.00%, unknown if currently breastfeeding.  Physical Exam:  General: alert, cooperative and no distress Lochia: appropriate Uterine Fundus: firm DVT Evaluation: No evidence of DVT seen on physical exam. Negative Homan's sign. No cords or calf tenderness.   Recent Labs  12/20/13 1255 12/21/13 0548  HGB 10.8* 9.9*  HCT 34.0* 31.2*    Assessment/Plan: Discharge home, Breastfeeding and Contraception will discuss at post partum visit   LOS: 2 days   [facility] Gilbert Gibbs 12/22/2013, 11:32 AM

## 2013-12-22 NOTE — Discharge Instructions (Signed)

## 2014-04-28 ENCOUNTER — Encounter (HOSPITAL_COMMUNITY): Payer: Self-pay | Admitting: *Deleted

## 2014-04-28 ENCOUNTER — Inpatient Hospital Stay (HOSPITAL_COMMUNITY)
Admission: AD | Admit: 2014-04-28 | Discharge: 2014-04-28 | Disposition: A | Discharge status: Home / Self Care | Payer: Medicaid Other | Source: Ambulatory Visit | Attending: Obstetrics & Gynecology | Admitting: Obstetrics & Gynecology

## 2014-04-28 DIAGNOSIS — A599 Trichomoniasis, unspecified: Secondary | ICD-10-CM

## 2014-04-28 DIAGNOSIS — A5901 Trichomonal vulvovaginitis: Secondary | ICD-10-CM | POA: Diagnosis not present

## 2014-04-28 DIAGNOSIS — N898 Other specified noninflammatory disorders of vagina: Secondary | ICD-10-CM | POA: Diagnosis present

## 2014-04-28 LAB — URINE MICROSCOPIC-ADD ON

## 2014-04-28 LAB — WET PREP, GENITAL
Clue Cells Wet Prep HPF POC: NONE SEEN
Yeast Wet Prep HPF POC: NONE SEEN

## 2014-04-28 LAB — URINALYSIS, ROUTINE W REFLEX MICROSCOPIC
Bilirubin Urine: NEGATIVE
Glucose, UA: NEGATIVE mg/dL
Hgb urine dipstick: NEGATIVE
Ketones, ur: NEGATIVE mg/dL
Nitrite: NEGATIVE
Protein, ur: NEGATIVE mg/dL
Specific Gravity, Urine: 1.025 (ref 1.005–1.030)
Urobilinogen, UA: 0.2 mg/dL (ref 0.0–1.0)
pH: 6 (ref 5.0–8.0)

## 2014-04-28 MED ORDER — METRONIDAZOLE 500 MG PO TABS: 2000.0000 mg | ORAL_TABLET | Freq: Once | ORAL | Status: DC

## 2014-04-28 NOTE — MAU Provider Note (Signed)
Attestation of Attending Supervision of Advanced Practitioner (CNM/NP): Evaluation and management procedures were performed by the Advanced Practitioner under my supervision and collaboration.  I have reviewed the Advanced Practitioner's note and chart, and I agree with the management and plan.  HARRAWAY-SMITH, Idali Lafever 5:12 PM

## 2014-04-28 NOTE — MAU Provider Note (Signed)
History     CSN: [id number]  Arrival date and time: 04/28/14 1516   First Provider Initiated Contact with Patient 04/28/14 1555      Chief Complaint  Patient presents with  . Vaginal Discharge   HPI  PT, Shelly Gibbs, is a 28 y.o. AA female who presents today with a 1 week history of vaginal discharge. PT states that she was treated for bacterial Vaginosis back in June. Pt completed 7 day course of Abx but states that the discharge never fully resolved after the competition of her Abx. Discharge has been on and off until the past week when it became a daily occurrence. Pt has noticed increasing brownish discharge over the past week with progressively worsening genital pruritis and foul smelling odor. Deniesabdominal and pelvic pain.   PT states that she has not been sexually active since March of this year and is not concerned about STDs. Pt has not taken any other Abx since she was treated for her previous episode of BV back in June. LMP Sep 5.  OB History   Grav Para Term Preterm Abortions TAB SAB Ect Mult Living   0 0 0 2      Past Medical History  Diagnosis Date  . Chlamydia   . UTI (lower urinary tract infection)   . Yeast infection   . Headache(784.0)   . BV (bacterial vaginosis)   . Gonorrhea 08-03-2012  . Asthma     inhaler last used "long time ago"    Past Surgical History  Procedure Laterality Date  . Induced abortion      x 2  . Dilation and curettage of uterus      Family History  Problem Relation Age of Onset  . Diabetes Maternal Aunt   . Anesthesia problems Neg Hx   . Other Neg Hx   . Hearing loss Neg Hx     History  Substance Use Topics  . Smoking status: Never Smoker   . Smokeless tobacco: Never Used  . Alcohol Use: Yes     Comment: occasional alcohol    Allergies:  Allergies  Allergen Reactions  . Latex Itching    Burning   . Tylenol [Acetaminophen] Hives    Patient states has taken Percocet in past and had no  issues/no hives. Hives only when taken actual Brand name Tylenol.    Prescriptions prior to admission  Medication Sig Dispense Refill  . ibuprofen (ADVIL,MOTRIN) 600 MG tablet Take 1 tablet (600 mg total) by mouth every 6 (six) hours as needed for moderate pain.  60 tablet  1  . zolpidem (AMBIEN) 5 MG tablet Take 1 tablet (5 mg total) by mouth at bedtime as needed for sleep (insomnia.).  30 tablet  0    Review of Systems  Constitutional: Negative for fever, chills and diaphoresis.  Respiratory: Negative for shortness of breath.   Cardiovascular: Negative for chest pain and palpitations.  Gastrointestinal: Negative for nausea, vomiting, abdominal pain, diarrhea and constipation.  Neurological: Negative for headaches.  Denies pelvic pain and vaginal bleeding  Physical Exam   Blood pressure 136/91, pulse 68, temperature 98.6 F (37 C), temperature source Oral, height  (1.575 m), weight 73.029 kg (161 lb), last menstrual period 04/16/2014, unknown if currently breastfeeding.  Physical Exam  Constitutional: She is oriented to person, place, and time. She appears well-developed and well-nourished.  HENT:  Head: Normocephalic and atraumatic.  Cardiovascular: Normal rate, regular rhythm and intact  distal pulses.   No murmur heard. Respiratory: Effort normal and breath sounds normal. No respiratory distress.  GI: Soft. She exhibits no distension. There is no tenderness. There is no guarding.  Genitourinary:  Speculum exam: Vagina - Small amount of creamy discharge, no bleeding, no odor Cervix - No contact bleeding Bimanual exam: Cervix closed Uterus non tender, normal size Adnexa non tender, no masses bilaterally GC/Chlam, wet prep done Chaperone present for exam.    Neurological: She is alert and oriented to person, place, and time.  Skin: Skin is warm.  Psychiatric: She has a normal mood and affect. Her behavior is normal.   Results for orders placed during the hospital  encounter of 04/28/14 (from the past 24 hour(s))  URINALYSIS, ROUTINE W REFLEX MICROSCOPIC     Status: Abnormal   Collection Time    04/28/14  3:30 PM      Result Value Ref Range   Color, Urine YELLOW  YELLOW   APPearance CLEAR  CLEAR   Specific Gravity, Urine 1.025  1.005 - 1.030   pH 6.0  5.0 - 8.0   Glucose, UA NEGATIVE  NEGATIVE mg/dL   Hgb urine dipstick NEGATIVE  NEGATIVE   Bilirubin Urine NEGATIVE  NEGATIVE   Ketones, ur NEGATIVE  NEGATIVE mg/dL   Protein, ur NEGATIVE  NEGATIVE mg/dL   Urobilinogen, UA 0.2  0.0 - 1.0 mg/dL   Nitrite NEGATIVE  NEGATIVE   Leukocytes, UA MODERATE (*) NEGATIVE  URINE MICROSCOPIC-ADD ON     Status: Abnormal   Collection Time    04/28/14  3:30 PM      Result Value Ref Range   Squamous Epithelial / LPF FEW (*) RARE   WBC, UA 3-6  <3 WBC/hpf   RBC / HPF 0-2  <3 RBC/hpf   Bacteria, UA FEW (*) RARE   Urine-Other TRICHOMONAS PRESENT    WET PREP, GENITAL     Status: Abnormal   Collection Time    04/28/14  4:14 PM      Result Value Ref Range   Yeast Wet Prep HPF POC NONE SEEN  NONE SEEN   Trich, Wet Prep FEW (*) NONE SEEN   Clue Cells Wet Prep HPF POC NONE SEEN  NONE SEEN   WBC, Wet Prep HPF POC FEW (*) NONE SEEN     MAU Course  Procedures None  MDM UA and wet prep today  Azucena Cecil 04/28/2014, 4:05 PM   Assessment and Plan  A: Trichomonas  P: Discharge home Rx for Flagyl given to patient Partner treatment advised Patient may return to MAU as needed or if her condition were to change or worsen  I have seen and evaluated the patient with the NP/PA/Med student. I agree with the assessment and plan as written above.   Marny Lowenstein, PA-C  04/28/2014 4:58 PM

## 2014-04-28 NOTE — MAU Note (Signed)
Pt states that after she took the full course of antibiotics for a bacterial infection (June 2015). States that she has been having foul smelling brownish vaginal discharge with itching since last week.

## 2014-04-28 NOTE — MAU Note (Signed)
Pt states she has had a discharge on and off since June. Pt states discharge has been constant since last week, pt states she has itching and discomfort. Discharge also has an odor as per pt

## 2014-04-28 NOTE — Discharge Instructions (Signed)

## 2014-06-13 ENCOUNTER — Encounter (HOSPITAL_COMMUNITY): Payer: Self-pay | Admitting: *Deleted

## 2014-06-19 ENCOUNTER — Encounter (HOSPITAL_BASED_OUTPATIENT_CLINIC_OR_DEPARTMENT_OTHER): Payer: Self-pay | Admitting: *Deleted

## 2014-06-19 ENCOUNTER — Emergency Department (HOSPITAL_BASED_OUTPATIENT_CLINIC_OR_DEPARTMENT_OTHER)
Admission: EM | Admit: 2014-06-19 | Discharge: 2014-06-19 | Disposition: A | Discharge status: Home / Self Care | Payer: Medicaid Other | Attending: Emergency Medicine | Admitting: Emergency Medicine

## 2014-06-19 DIAGNOSIS — Z3201 Encounter for pregnancy test, result positive: Secondary | ICD-10-CM | POA: Insufficient documentation

## 2014-06-19 DIAGNOSIS — Z87440 Personal history of urinary (tract) infections: Secondary | ICD-10-CM | POA: Insufficient documentation

## 2014-06-19 DIAGNOSIS — Z8742 Personal history of other diseases of the female genital tract: Secondary | ICD-10-CM | POA: Diagnosis not present

## 2014-06-19 DIAGNOSIS — Z91040 Latex allergy status: Secondary | ICD-10-CM | POA: Insufficient documentation

## 2014-06-19 DIAGNOSIS — K088 Other specified disorders of teeth and supporting structures: Secondary | ICD-10-CM | POA: Insufficient documentation

## 2014-06-19 DIAGNOSIS — K0889 Other specified disorders of teeth and supporting structures: Secondary | ICD-10-CM

## 2014-06-19 DIAGNOSIS — J45909 Unspecified asthma, uncomplicated: Secondary | ICD-10-CM | POA: Diagnosis not present

## 2014-06-19 DIAGNOSIS — Z8619 Personal history of other infectious and parasitic diseases: Secondary | ICD-10-CM | POA: Diagnosis not present

## 2014-06-19 DIAGNOSIS — Z79818 Long term (current) use of other agents affecting estrogen receptors and estrogen levels: Secondary | ICD-10-CM | POA: Insufficient documentation

## 2014-06-19 DIAGNOSIS — Z331 Pregnant state, incidental: Secondary | ICD-10-CM | POA: Diagnosis not present

## 2014-06-19 DIAGNOSIS — Z792 Long term (current) use of antibiotics: Secondary | ICD-10-CM | POA: Diagnosis not present

## 2014-06-19 LAB — PREGNANCY, URINE: Preg Test, Ur: POSITIVE — AB

## 2014-06-19 MED ORDER — PENICILLIN V POTASSIUM 500 MG PO TABS: 500.0000 mg | ORAL_TABLET | Freq: Three times a day (TID) | ORAL | Status: DC

## 2014-06-19 NOTE — Discharge Instructions (Signed)
Penicillin as prescribed.  Follow-up with dentistry. The contact information for the on-call provider has been given to you in this discharge summary. Call on Monday to arrange this appointment.  He should also call your gynecologist to arrange follow-up for prenatal care.   Dental Pain A tooth ache may be caused by cavities (tooth decay). Cavities expose the nerve of the tooth to air and hot or cold temperatures. It may come from an infection or abscess (also called a boil or furuncle) around your tooth. It is also often caused by dental caries (tooth decay). This causes the pain you are having. DIAGNOSIS  Your caregiver can diagnose this problem by exam. TREATMENT   If caused by an infection, it may be treated with medications which kill germs (antibiotics) and pain medications as prescribed by your caregiver. Take medications as directed.  Only take over-the-counter or prescription medicines for pain, discomfort, or fever as directed by your caregiver.  Whether the tooth ache today is caused by infection or dental disease, you should see your dentist as soon as possible for further care. SEEK MEDICAL CARE IF: The exam and treatment you received today has been provided on an emergency basis only. This is not a substitute for complete medical or dental care. If your problem worsens or new problems (symptoms) appear, and you are unable to meet with your dentist, call or return to this location. SEEK IMMEDIATE MEDICAL CARE IF:   You have a fever.  You develop redness and swelling of your face, jaw, or neck.  You are unable to open your mouth.  You have severe pain uncontrolled by pain medicine. MAKE SURE YOU:   Understand these instructions.  Will watch your condition.  Will get help right away if you are not doing well or get worse. Document Released: 07/29/2005 Document Revised: 10/21/2011 Document Reviewed: 03/16/2008 [facility] Patient Information 2015 [employer] [place]. This  information is not intended to replace advice given to you by your health care provider. Make sure you discuss any questions you have with your health care provider.

## 2014-06-19 NOTE — ED Notes (Signed)
Patient c/o dental pain on L upper side due to cracked toothfor the past week. Also concerned that she may be pregnant and wants a pregnancy test

## 2014-06-19 NOTE — ED Provider Notes (Signed)
CSN: [id number]     Arrival date & time 06/19/14  1000 History   First MD Initiated Contact with Patient 06/19/14 1030     Chief Complaint  Patient presents with  . Dental Pain     (Consider location/radiation/quality/duration/timing/severity/associated sxs/prior Treatment) HPI Comments: Patient is a 28 year old female who presents with complaints of left upper jaw pain. She feels like her face is swollen. She denies fevers.  She is also concerned she may be pregnant as she is late for her period. She is requesting a pregnancy test. She is experiencing no abdominal pain, bleeding, spotting, or other GU symptoms.  Patient is a 28 y.o. female presenting with tooth pain. The history is provided by the patient.  Dental Pain Location:  Upper Upper teeth location:  15/LU 2nd molar Quality:  Throbbing Severity:  Moderate Onset quality:  Gradual Duration:  2 days Timing:  Constant Progression:  Worsening Chronicity:  New Context: poor dentition   Context: not abscess   Relieved by:  Nothing Worsened by:  Nothing tried   Past Medical History  Diagnosis Date  . Chlamydia   . UTI (lower urinary tract infection)   . Yeast infection   . Headache(784.0)   . BV (bacterial vaginosis)   . Gonorrhea 08-03-2012  . Asthma     inhaler last used "long time ago"   Past Surgical History  Procedure Laterality Date  . Induced abortion      x 2  . Dilation and curettage of uterus     Family History  Problem Relation Age of Onset  . Diabetes Maternal Aunt   . Anesthesia problems Neg Hx   . Other Neg Hx   . Hearing loss Neg Hx    History  Substance Use Topics  . Smoking status: Never Smoker   . Smokeless tobacco: Never Used  . Alcohol Use: Yes     Comment: occasional alcohol   OB History    Gravida Para Term Preterm AB TAB SAB Ectopic Multiple Living   7 2 2  0 5 4 1  0 0 2     Review of Systems  All other systems reviewed and are negative.     Allergies  Latex and  Tylenol  Home Medications   Prior to Admission medications   Medication Sig Start Date End Date Taking? Authorizing Provider  norethindrone-ethinyl estradiol-iron (ESTROSTEP FE,TILIA FE,TRI-LEGEST FE) 1-20/1-30/1-35 MG-MCG tablet Take 1 tablet by mouth daily.   Yes Historical Provider, MD  metroNIDAZOLE (FLAGYL) 500 MG tablet Take 4 tablets (2,000 mg total) by mouth once. 04/28/14   Marny LowensteinJulie N Wenzel, PA-C   BP 130/94 mmHg  Pulse 85  Temp(Src) 98.7 F (37.1 C) (Oral)  Resp 18  Ht 5\' 4"  (1.626 m)  Wt 160 lb (72.576 kg)  BMI 27.45 kg/m2  SpO2 100% Physical Exam  Constitutional: She is oriented to person, place, and time. She appears well-developed and well-nourished. No distress.  HENT:  Head: Normocephalic and atraumatic.  There is tenderness to palpation around the left cheek and mandible. There are multiple decayed teeth on both the upper and lower molars. There is mild gingival inflammation, however no evidence for abscess formation.  The left TM is clear.  Neck: Normal range of motion. Neck supple.  Musculoskeletal: Normal range of motion.  Neurological: She is alert and oriented to person, place, and time.  Skin: Skin is warm and dry. She is not diaphoretic.  Nursing note and vitals reviewed.   ED Course  Procedures (including  critical care time) Labs Review Labs Reviewed  PREGNANCY, URINE - Abnormal; Notable for the following:    Preg Test, Ur POSITIVE (*)    All other components within normal limits    Imaging Review No results found.   EKG Interpretation None      MDM   Final diagnoses:  None    She will be treated with penicillin and follow-up with dentistry. I would like to avoid narcotic medications due to her pregnancy.    Geoffery Lyonsouglas Sayaka Hoeppner, MD 06/19/14 1040

## 2014-08-06 ENCOUNTER — Emergency Department (HOSPITAL_COMMUNITY)
Admission: EM | Admit: 2014-08-06 | Discharge: 2014-08-06 | Discharge status: Absent without leave | Payer: Medicaid Other | Source: Home / Self Care

## 2014-08-06 NOTE — ED Notes (Signed)
No answer in waiting room.  Per registration staff patient stated she had to go to the car to change her sons diaper.

## 2014-08-06 NOTE — ED Notes (Signed)
No answer... Pt is not in the building.

## 2015-12-04 ENCOUNTER — Encounter (HOSPITAL_COMMUNITY): Payer: Self-pay

## 2015-12-04 ENCOUNTER — Inpatient Hospital Stay (HOSPITAL_COMMUNITY)
Admission: AD | Admit: 2015-12-04 | Discharge: 2015-12-04 | Disposition: A | Discharge status: Home / Self Care | Payer: Medicaid Other | Source: Ambulatory Visit | Attending: Family Medicine | Admitting: Family Medicine

## 2015-12-04 DIAGNOSIS — N760 Acute vaginitis: Secondary | ICD-10-CM | POA: Diagnosis not present

## 2015-12-04 DIAGNOSIS — Z91040 Latex allergy status: Secondary | ICD-10-CM | POA: Insufficient documentation

## 2015-12-04 DIAGNOSIS — N898 Other specified noninflammatory disorders of vagina: Secondary | ICD-10-CM | POA: Diagnosis present

## 2015-12-04 DIAGNOSIS — A499 Bacterial infection, unspecified: Secondary | ICD-10-CM

## 2015-12-04 DIAGNOSIS — B9689 Other specified bacterial agents as the cause of diseases classified elsewhere: Secondary | ICD-10-CM

## 2015-12-04 DIAGNOSIS — J45909 Unspecified asthma, uncomplicated: Secondary | ICD-10-CM | POA: Diagnosis not present

## 2015-12-04 DIAGNOSIS — Z888 Allergy status to other drugs, medicaments and biological substances status: Secondary | ICD-10-CM | POA: Diagnosis not present

## 2015-12-04 LAB — WET PREP, GENITAL
Sperm: NONE SEEN
Trich, Wet Prep: NONE SEEN
Yeast Wet Prep HPF POC: NONE SEEN

## 2015-12-04 LAB — URINALYSIS, ROUTINE W REFLEX MICROSCOPIC
Bilirubin Urine: NEGATIVE
Glucose, UA: NEGATIVE mg/dL
Hgb urine dipstick: NEGATIVE
Ketones, ur: NEGATIVE mg/dL
Nitrite: NEGATIVE
Protein, ur: NEGATIVE mg/dL
Specific Gravity, Urine: 1.02 (ref 1.005–1.030)
pH: 6 (ref 5.0–8.0)

## 2015-12-04 LAB — URINE MICROSCOPIC-ADD ON
Bacteria, UA: NONE SEEN
RBC / HPF: NONE SEEN RBC/hpf (ref 0–5)

## 2015-12-04 LAB — POCT PREGNANCY, URINE: Preg Test, Ur: NEGATIVE

## 2015-12-04 MED ORDER — CLINDAMYCIN HCL 300 MG PO CAPS: 300.0000 mg | ORAL_CAPSULE | Freq: Two times a day (BID) | ORAL | Status: DC

## 2015-12-04 NOTE — Discharge Instructions (Signed)
Bacterial Vaginosis Bacterial vaginosis is a vaginal infection that occurs when the normal balance of bacteria in the vagina is disrupted. It results from an overgrowth of certain bacteria. This is the most common vaginal infection in women of childbearing age. Treatment is important to prevent complications, especially in pregnant women, as it can cause a premature delivery. CAUSES  Bacterial vaginosis is caused by an increase in harmful bacteria that are normally present in smaller amounts in the vagina. Several different kinds of bacteria can cause bacterial vaginosis. However, the reason that the condition develops is not fully understood. RISK FACTORS Certain activities or behaviors can put you at an increased risk of developing bacterial vaginosis, including:  Having a new sex partner or multiple sex partners.  Douching.  Using an intrauterine device (IUD) for contraception. Women do not get bacterial vaginosis from toilet seats, bedding, swimming pools, or contact with objects around them. SIGNS AND SYMPTOMS  Some women with bacterial vaginosis have no signs or symptoms. Common symptoms include:  Grey vaginal discharge.  A fishlike odor with discharge, especially after sexual intercourse.  Itching or burning of the vagina and vulva.  Burning or pain with urination. DIAGNOSIS  Your health care provider will take a medical history and examine the vagina for signs of bacterial vaginosis. A sample of vaginal fluid may be taken. Your health care provider will look at this sample under a microscope to check for bacteria and abnormal cells. A vaginal pH test may also be done.  TREATMENT  Bacterial vaginosis may be treated with antibiotic medicines. These may be given in the form of a pill or a vaginal cream. A second round of antibiotics may be prescribed if the condition comes back after treatment. Because bacterial vaginosis increases your risk for sexually transmitted diseases, getting  treated can help reduce your risk for chlamydia, gonorrhea, HIV, and herpes. HOME CARE INSTRUCTIONS   Only take over-the-counter or prescription medicines as directed by your health care provider.  If antibiotic medicine was prescribed, take it as directed. Make sure you finish it even if you start to feel better.  During treatment, it is important that you follow these instructions:  Avoid sexual activity or use condoms correctly.  Do not douche.  Avoid alcohol as directed by your health care provider.  Avoid breastfeeding as directed by your health care provider. SEEK MEDICAL CARE IF:   Your symptoms are not improving after 3 days of treatment.  You have increased discharge or pain.  You have a fever. MAKE SURE YOU:   Understand these instructions.  Will watch your condition.  Will get help right away if you are not doing well or get worse.    This information is not intended to replace advice given to you by your health care provider. Make sure you discuss any questions you have with your health care provider.   Document Released: 07/29/2005 Document Revised: 08/19/2014 Document Reviewed: 03/10/2013 Elsevier Interactive Patient Education [employer]

## 2015-12-04 NOTE — MAU Provider Note (Signed)
History     CSN: [id number]  Arrival date and time: 12/04/15 1531   First Provider Initiated Contact with Patient 12/04/15 1626       Chief Complaint  Patient presents with  . Vaginal Discharge   Shelly Gibbs is a 30 y.o. female who presents with vaginal discharge & odor. States has been treated for BV with flagyl 3 times since September.   Female GU Problem The patient's primary symptoms include a genital odor and vaginal discharge. The patient's pertinent negatives include no genital itching, genital lesions, genital rash, missed menses or vaginal bleeding. This is a recurrent problem. The current episode started in the past 7 days. The problem has been unchanged. She is not pregnant. Associated symptoms include dysuria. Pertinent negatives include no abdominal pain, constipation, diarrhea, discolored urine, fever, frequency, nausea, painful intercourse, urgency or vomiting. The vaginal discharge was malodorous, yellow, brown and thin. There has been no bleeding. Nothing aggravates the symptoms. Treatments tried: has been treated with flagyl 3 times since September. She is sexually active. No, her partner does not have an STD. She uses nothing for contraception. Her menstrual history has been regular. Her past medical history is significant for an STD and vaginosis. There is no history of herpes simplex or PID.     OB History    Gravida Para Term Preterm AB TAB SAB Ectopic Multiple Living   0 0 0 2      Past Medical History  Diagnosis Date  . Chlamydia   . UTI (lower urinary tract infection)   . Yeast infection   . Headache(784.0)   . BV (bacterial vaginosis)   . Gonorrhea 08-03-2012  . Asthma     inhaler last used "long time ago"    Past Surgical History  Procedure Laterality Date  . Induced abortion      x 2  . Dilation and curettage of uterus      Family History  Problem Relation Age of Onset  . Diabetes Maternal Aunt   . Anesthesia problems Neg  Hx   . Other Neg Hx   . Hearing loss Neg Hx     Social History  Substance Use Topics  . Smoking status: Never Smoker   . Smokeless tobacco: Never Used  . Alcohol Use: Yes     Comment: occasional alcohol    Allergies:  Allergies  Allergen Reactions  . Latex Itching    Burning   . Tylenol [Acetaminophen] Hives    Patient states has taken Percocet in past and had no issues/no hives. Hives only when taken actual Brand name Tylenol.    Prescriptions prior to admission  Medication Sig Dispense Refill Last Dose  . metroNIDAZOLE (FLAGYL) 500 MG tablet Take 4 tablets (2,000 mg total) by mouth once. (Patient not taking: Reported on 12/04/2015) 4 tablet 0   . penicillin v potassium (VEETID) 500 MG tablet Take 1 tablet (500 mg total) by mouth 3 (three) times daily. (Patient not taking: Reported on 12/04/2015) 30 tablet 0     Review of Systems  Constitutional: Negative for fever.  Gastrointestinal: Negative for nausea, vomiting, abdominal pain, diarrhea and constipation.  Genitourinary: Positive for dysuria and vaginal discharge. Negative for urgency, frequency and missed menses.   Physical Exam   Blood pressure 126/76, pulse 99, temperature 98.3 F (36.8 C), temperature source Oral, resp. rate 18, height  (1.549 m), weight 165 lb (74.844 kg), last menstrual period 11/18/2015, unknown if currently  breastfeeding.  Physical Exam  Nursing note and vitals reviewed. Constitutional: She is oriented to person, place, and time. She appears well-developed and well-nourished. No distress.  HENT:  Head: Normocephalic and atraumatic.  Eyes: Conjunctivae are normal. Right eye exhibits no discharge. Left eye exhibits no discharge. No scleral icterus.  Neck: Normal range of motion.  Respiratory: Effort normal. No respiratory distress.  GI: Soft. She exhibits no distension. There is no tenderness.  Genitourinary: Vagina normal and uterus normal. Cervix exhibits discharge (small amount of  mucopurulent discharge with foul odor). Cervix exhibits no motion tenderness and no friability. Right adnexum displays no mass and no tenderness. Left adnexum displays no mass and no tenderness.  Neurological: She is alert and oriented to person, place, and time.  Skin: Skin is warm and dry. She is not diaphoretic.  Psychiatric: She has a normal mood and affect. Her behavior is normal. Judgment and thought content normal.    MAU Course  Procedures Results for orders placed or performed during the hospital encounter of 12/04/15 (from the past 24 hour(s))  Urinalysis, Routine w reflex microscopic ([facility])     Status: Abnormal   Collection Time: 12/04/15  3:45 PM  Result Value Ref Range   Color, Urine YELLOW YELLOW   APPearance CLEAR CLEAR   Specific Gravity, Urine 1.020 1.005 - 1.030   pH 6.0 5.0 - 8.0   Glucose, UA NEGATIVE NEGATIVE mg/dL   Hgb urine dipstick NEGATIVE NEGATIVE   Bilirubin Urine NEGATIVE NEGATIVE   Ketones, ur NEGATIVE NEGATIVE mg/dL   Protein, ur NEGATIVE NEGATIVE mg/dL   Nitrite NEGATIVE NEGATIVE   Leukocytes, UA SMALL (A) NEGATIVE  Urine microscopic-add on     Status: Abnormal   Collection Time: 12/04/15  3:45 PM  Result Value Ref Range   Squamous Epithelial / LPF 0-5 (A) NONE SEEN   WBC, UA 0-5 0 - 5 WBC/hpf   RBC / HPF NONE SEEN 0 - 5 RBC/hpf   Bacteria, UA NONE SEEN NONE SEEN  Pregnancy, urine POC     Status: None   Collection Time: 12/04/15  3:55 PM  Result Value Ref Range   Preg Test, Ur NEGATIVE NEGATIVE  Wet prep, genital     Status: Abnormal   Collection Time: 12/04/15  4:40 PM  Result Value Ref Range   Yeast Wet Prep HPF POC NONE SEEN NONE SEEN   Trich, Wet Prep NONE SEEN NONE SEEN   Clue Cells Wet Prep HPF POC PRESENT (A) NONE SEEN   WBC, Wet Prep HPF POC MANY (A) NONE SEEN   Sperm NONE SEEN     MDM UPT negative Pt decline blood work GC/CT & wet prep Will treat with Clindamycin for recurrent BV  Assessment and Plan  A: 1. BV  (bacterial vaginosis)     P: Discharge home Rx clindamycin 300 mg BID x 7 days Recommend oral otc probiotics GC/CT pending  Judeth Hornrin Kasheena Sambrano 12/04/2015, 4:25 PM

## 2015-12-04 NOTE — MAU Note (Signed)
Was in jail Sept to March, was treated x2 for BV. Since getting out has been to the dr and been treated again.  D/c and odor are back.

## 2015-12-05 LAB — GC/CHLAMYDIA PROBE AMP (~~LOC~~) NOT AT ARMC
Chlamydia: NEGATIVE
Neisseria Gonorrhea: POSITIVE — AB

## 2015-12-06 ENCOUNTER — Ambulatory Visit (INDEPENDENT_AMBULATORY_CARE_PROVIDER_SITE_OTHER): Payer: Medicaid Other

## 2015-12-06 DIAGNOSIS — A549 Gonococcal infection, unspecified: Secondary | ICD-10-CM | POA: Diagnosis present

## 2015-12-06 MED ORDER — AZITHROMYCIN 250 MG PO TABS
1000.0000 mg | ORAL_TABLET | Freq: Once | ORAL | Status: AC
  Administered 2015-12-06: 1000 mg via ORAL

## 2015-12-06 MED ORDER — CEFTRIAXONE SODIUM 1 G IJ SOLR
250.0000 mg | Freq: Once | INTRAMUSCULAR | Status: AC
  Administered 2015-12-06: 250 mg via INTRAMUSCULAR

## 2015-12-08 ENCOUNTER — Encounter (HOSPITAL_BASED_OUTPATIENT_CLINIC_OR_DEPARTMENT_OTHER): Payer: Self-pay | Admitting: Emergency Medicine

## 2015-12-08 ENCOUNTER — Emergency Department (HOSPITAL_BASED_OUTPATIENT_CLINIC_OR_DEPARTMENT_OTHER)
Admission: EM | Admit: 2015-12-08 | Discharge: 2015-12-08 | Disposition: A | Discharge status: Home / Self Care | Payer: Medicaid Other | Attending: Emergency Medicine | Admitting: Emergency Medicine

## 2015-12-08 DIAGNOSIS — J45909 Unspecified asthma, uncomplicated: Secondary | ICD-10-CM | POA: Insufficient documentation

## 2015-12-08 DIAGNOSIS — Z79899 Other long term (current) drug therapy: Secondary | ICD-10-CM | POA: Diagnosis not present

## 2015-12-08 DIAGNOSIS — Z202 Contact with and (suspected) exposure to infections with a predominantly sexual mode of transmission: Secondary | ICD-10-CM | POA: Diagnosis not present

## 2015-12-08 LAB — URINE MICROSCOPIC-ADD ON

## 2015-12-08 LAB — URINALYSIS, ROUTINE W REFLEX MICROSCOPIC
Bilirubin Urine: NEGATIVE
Glucose, UA: NEGATIVE mg/dL
Hgb urine dipstick: NEGATIVE
Ketones, ur: NEGATIVE mg/dL
Nitrite: NEGATIVE
Protein, ur: NEGATIVE mg/dL
Specific Gravity, Urine: 1.021 (ref 1.005–1.030)
pH: 5.5 (ref 5.0–8.0)

## 2015-12-08 LAB — PREGNANCY, URINE: Preg Test, Ur: NEGATIVE

## 2015-12-08 MED ORDER — PENICILLIN G BENZATHINE 1200000 UNIT/2ML IM SUSP
INTRAMUSCULAR | Status: AC
  Filled 2015-12-08: qty 2

## 2015-12-08 MED ORDER — PENICILLIN G BENZATHINE 1200000 UNIT/2ML IM SUSP
2.4000 10*6.[IU] | Freq: Once | INTRAMUSCULAR | Status: AC
  Administered 2015-12-08: 2.4 10*6.[IU] via INTRAMUSCULAR
  Filled 2015-12-08: qty 4

## 2015-12-08 NOTE — ED Notes (Signed)
Pt exposed to std

## 2015-12-08 NOTE — Discharge Instructions (Signed)
Test of recheck necessary follow up with the infectious disease clinic.  Syphilis Syphilis is an infectious disease. It can cause serious complications if left untreated.  CAUSES  Syphilis is caused by a type of bacteria called Treponema pallidum. It is most commonly spread through sexual contact. Syphilis may also spread to a fetus through the blood of the mother.  SIGNS AND SYMPTOMS Symptoms vary depending on the stage of the disease. Some symptoms may disappear without treatment. However, this does not mean that the infection is gone. One form of syphilis (called latent syphilis) has no symptoms.  Primary Syphilis  Painless sores (chancres) in and around the genital organs and mouth.  Swollen lymph nodes near the sores. Secondary Syphilis  A rash or sores over any portion of the body, including the palms of the hands and soles of the feet.  Fever.  Headache.  Sore throat.  Swollen lymph nodes.  New sores in the mouth or on the genitals.  Feeling generally ill.  Having pain in the joints. Tertiary Syphilis The third stage of syphilis involves severe damage to different organs in the body, such as the brain, spinal cord, and heart. Signs and symptoms may include:   Dementia.  Personality and mood changes.  Difficulty walking.  Heart failure.  Fainting.  Enlargement (aneurysm) of the aorta.  Tumors of the skin, bones, or liver.  Muscle weakness.  Sudden "lightning" pains, numbness, or tingling.  Problems with coordination.  Vision changes. DIAGNOSIS   A physical exam will be done.  Blood tests will be done to confirm the diagnosis.  If the disease is in the first or second stages, a fluid (drainage) sample from a sore or rash may be examined under a microscope to detect the disease-causing bacteria.  Fluid around the spine may need to be examined to detect brain damage or inflammation of the brain lining (meningitis).  If the disease is in the third  stage, X-rays, CT scans, MRIs, echocardiograms, ultrasounds, or cardiac catheterization may also be done to detect disease of the heart, aorta, or brain. TREATMENT  Syphilis can be cured with antibiotic medicine if a diagnosis is made early. During the first day of treatment, you may experience fever, chills, headache, nausea, or aching all over your body. This is a normal reaction to the antibiotics.  HOME CARE INSTRUCTIONS   Take your antibiotic medicine as directed by your health care provider. Finish the antibiotic even if you start to feel better. Incomplete treatment will put you at risk for continued infection and could be life threatening.  Take medicines only as directed by your health care provider.  Do not have sexual intercourse until your treatment is completed or as directed by your health care provider.  Inform your recent sexual partners that you were diagnosed with syphilis. They need to seek care and treatment, even if they have no symptoms. It is necessary that all your sexual partners be tested for infection and treated if they have the disease.  Keep all follow-up visits as directed by your health care provider. It is important to keep all your appointments.  If your test results are not ready during your visit, make an appointment with your health care provider to find out the results. Do not assume everything is normal if you have not heard from your health care provider or the medical facility. It is your responsibility to get your test results. SEEK MEDICAL CARE IF:  You continue to have any of the following  24 hours after beginning treatment:  Fever.  Chills.  Headache.  Nausea.  Aching all over your body.  You have symptoms of an allergic reaction to medicine, such as:  Chills.  A headache.  Light-headedness.  A new rash (especially hives).  Difficulty breathing. MAKE SURE YOU:   Understand these instructions.  Will watch your condition.  Will  get help right away if you are not doing well or get worse.   This information is not intended to replace advice given to you by your health care provider. Make sure you discuss any questions you have with your health care provider.   Document Released: 05/19/2013 Document Revised: 08/19/2014 Document Reviewed: 05/19/2013 Elsevier Interactive Patient Education 2016 [employer]  Sexually Transmitted Disease A sexually transmitted disease (STD) is a disease or infection that may be passed (transmitted) from person to person, usually during sexual activity. This may happen by way of saliva, semen, blood, vaginal mucus, or urine. Common STDs include:  Gonorrhea.  Chlamydia.  Syphilis.  HIV and AIDS.  Genital herpes.  Hepatitis B and C.  Trichomonas.  Human papillomavirus (HPV).  Pubic lice.  Scabies.  Mites.  Bacterial vaginosis. WHAT ARE CAUSES OF STDs? An STD may be caused by bacteria, a virus, or parasites. STDs are often transmitted during sexual activity if one person is infected. However, they may also be transmitted through nonsexual means. STDs may be transmitted after:   Sexual intercourse with an infected person.  Sharing sex toys with an infected person.  Sharing needles with an infected person or using unclean piercing or tattoo needles.  Having intimate contact with the genitals, mouth, or rectal areas of an infected person.  Exposure to infected fluids during birth. WHAT ARE THE SIGNS AND SYMPTOMS OF STDs? Different STDs have different symptoms. Some people may not have any symptoms. If symptoms are present, they may include:  Painful or bloody urination.  Pain in the pelvis, abdomen, vagina, anus, throat, or eyes.  A skin rash, itching, or irritation.  Growths, ulcerations, blisters, or sores in the genital and anal areas.  Abnormal vaginal discharge with or without bad odor.  Penile discharge in men.  Fever.  Pain or bleeding during sexual  intercourse.  Swollen glands in the groin area.  Yellow skin and eyes (jaundice). This is seen with hepatitis.  Swollen testicles.  Infertility.  Sores and blisters in the mouth. HOW ARE STDs DIAGNOSED? To make a diagnosis, your health care provider may:  Take a medical history.  Perform a physical exam.  Take a sample of any discharge to examine.  Swab the throat, cervix, opening to the penis, rectum, or vagina for testing.  Test a sample of your first morning urine.  Perform blood tests.  Perform a Pap test, if this applies.  Perform a colposcopy.  Perform a laparoscopy. HOW ARE STDs TREATED? Treatment depends on the STD. Some STDs may be treated but not cured.  Chlamydia, gonorrhea, trichomonas, and syphilis can be cured with antibiotic medicine.  Genital herpes, hepatitis, and HIV can be treated, but not cured, with prescribed medicines. The medicines lessen symptoms.  Genital warts from HPV can be treated with medicine or by freezing, burning (electrocautery), or surgery. Warts may come back.  HPV cannot be cured with medicine or surgery. However, abnormal areas may be removed from the cervix, vagina, or vulva.  If your diagnosis is confirmed, your recent sexual partners need treatment. This is true even if they are symptom-free or have a  negative culture or evaluation. They should not have sex until their health care providers say it is okay.  Your health care provider may test you for infection again 3 months after treatment. HOW CAN I REDUCE MY RISK OF GETTING AN STD? Take these steps to reduce your risk of getting an STD:  Use latex condoms, dental dams, and water-soluble lubricants during sexual activity. Do not use petroleum jelly or oils.  Avoid having multiple sex partners.  Do not have sex with someone who has other sex partners  Do not have sex with anyone you do not know or who is at high risk for an STD.  Avoid risky sex practices that can  break your skin.  Do not have sex if you have open sores on your mouth or skin.  Avoid drinking too much alcohol or taking illegal drugs. Alcohol and drugs can affect your judgment and put you in a vulnerable position.  Avoid engaging in oral and anal sex acts.  Get vaccinated for HPV and hepatitis. If you have not received these vaccines in the past, talk to your health care provider about whether one or both might be right for you.  If you are at risk of being infected with HIV, it is recommended that you take a prescription medicine daily to prevent HIV infection. This is called pre-exposure prophylaxis (PrEP). You are considered at risk if:  You are a man who has sex with other men (MSM).  You are a heterosexual man or woman and are sexually active with more than one partner.  You take drugs by injection.  You are sexually active with a partner who has HIV.  Talk with your health care provider about whether you are at high risk of being infected with HIV. If you choose to begin PrEP, you should first be tested for HIV. You should then be tested every 3 months for as long as you are taking PrEP. WHAT SHOULD I DO IF I THINK I HAVE AN STD?  See your health care provider.  Tell your sexual partner(s). They should be tested and treated for any STDs.  Do not have sex until your health care provider says it is okay. WHEN SHOULD I GET IMMEDIATE MEDICAL CARE? Contact your health care provider right away if:   You have severe abdominal pain.  You are a man and notice swelling or pain in your testicles.  You are a woman and notice swelling or pain in your vagina.   This information is not intended to replace advice given to you by your health care provider. Make sure you discuss any questions you have with your health care provider.   Document Released: 10/19/2002 Document Revised: 08/19/2014 Document Reviewed: 02/16/2013 Elsevier Interactive Patient Education 2016 [employer].  Safe Sex Safe sex is about reducing the risk of giving or getting a sexually transmitted disease (STD). STDs are spread through sexual contact involving the genitals, mouth, or rectum. Some STDs can be cured and others cannot. Safe sex can also prevent unintended pregnancies.  WHAT ARE SOME SAFE SEX PRACTICES?  Limit your sexual activity to only one partner who is having sex with only you.  Talk to your partner about his or her past partners, past STDs, and drug use.  Use a condom every time you have sexual intercourse. This includes vaginal, oral, and anal sexual activity. Both females and males should wear condoms during oral sex. Only use latex or polyurethane condoms and water-based lubricants. Using  petroleum-based lubricants or oils to lubricate a condom will weaken the condom and increase the chance that it will break. The condom should be in place from the beginning to the end of sexual activity. Wearing a condom reduces, but does not completely eliminate, your risk of getting or giving an STD. STDs can be spread by contact with infected body fluids and skin.  Get vaccinated for hepatitis B and HPV.  Avoid alcohol and recreational drugs, which can affect your judgment. You may forget to use a condom or participate in high-risk sex.  For females, avoid douching after sexual intercourse. Douching can spread an infection farther into the reproductive tract.  Check your body for signs of sores, blisters, rashes, or unusual discharge. See your health care provider if you notice any of these signs.  Avoid sexual contact if you have symptoms of an infection or are being treated for an STD. If you or your partner has herpes, avoid sexual contact when blisters are present. Use condoms at all other times.  If you are at risk of being infected with HIV, it is recommended that you take a prescription medicine daily to prevent HIV infection. This is called pre-exposure prophylaxis (PrEP). You  are considered at risk if:  You are a man who has sex with other men (MSM).  You are a heterosexual man or woman who is sexually active with more than one partner.  You take drugs by injection.  You are sexually active with a partner who has HIV.  Talk with your health care provider about whether you are at high risk of being infected with HIV. If you choose to begin PrEP, you should first be tested for HIV. You should then be tested every 3 months for as long as you are taking PrEP.  See your health care provider for regular screenings, exams, and tests for other STDs. Before having sex with a new partner, each of you should be screened for STDs and should talk about the results with each other. WHAT ARE THE BENEFITS OF SAFE SEX?   There is less chance of getting or giving an STD.  You can prevent unwanted or unintended pregnancies.  By discussing safe sex concerns with your partner, you may increase feelings of intimacy, comfort, trust, and honesty between the two of you.   This information is not intended to replace advice given to you by your health care provider. Make sure you discuss any questions you have with your health care provider.   Document Released: 09/05/2004 Document Revised: 08/19/2014 Document Reviewed: 01/20/2012 Elsevier Interactive Patient Education [employer]

## 2015-12-08 NOTE — ED Provider Notes (Signed)
CSN: [id number]     Arrival date & time 12/08/15  1053 History   First MD Initiated Contact with Patient 12/08/15 1119     Chief Complaint  Patient presents with  . 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 The patient was treated earlier this week at [facility] for gonorrhea and chlamydia. The patient's boyfriend is here in the emergency department for treatment of syphilis. Ms. Inda MerlinHickman denies any current symptoms. She did not know if his diagnosis of syphilis. She will have HIV and syphilis titers drawn today. She denies a history of penicillin allergy. She states that she was incarcerated up until March 16 and was checked for STDs while incarcerated. Her results were negative and she states that she has been monogamous with her boyfriend. She denies any urinary symptoms. Past Medical History  Diagnosis Date  . Chlamydia   . UTI (lower urinary tract infection)   . Yeast infection   . Headache(784.0)   . BV (bacterial vaginosis)   . Gonorrhea 08-03-2012  . Asthma     inhaler last used "long time ago"   Past Surgical History  Procedure Laterality Date  . Induced abortion      x 2  . Dilation and curettage of uterus     Family History  Problem Relation Age of Onset  . Diabetes Maternal Aunt   . Anesthesia problems Neg Hx   . Other Neg Hx   . Hearing loss Neg Hx    Social History  Substance Use Topics  . Smoking status: Never Smoker   . Smokeless tobacco: Never Used  . Alcohol Use: Yes     Comment: occasional alcohol   OB History    Gravida Para Term Preterm AB TAB SAB Ectopic Multiple Living   7 2 2  0 5 4 1  0 0 2     Review of Systems  Ten systems reviewed and are negative for acute change, except as noted in the HPI.    Allergies  Latex and Tylenol  Home Medications   Prior to  Admission medications   Medication Sig Start Date End Date Taking? Authorizing Provider  clindamycin (CLEOCIN) 300 MG capsule Take 1 capsule (300 mg total) by mouth 2 (two) times daily. 12/04/15  Yes Judeth HornErin Lawrence, NP   BP 122/91 mmHg  Pulse 90  Temp(Src) 98.6 F (37 C) (Oral)  Resp 18  Ht 5\' 1"  (1.549 m)  Wt 74.844 kg  BMI 31.19 kg/m2  SpO2 100%  LMP 11/18/2015 Physical Exam Physical Exam  Nursing note and vitals reviewed. Constitutional: She is oriented to person, place, and time. She appears well-developed and well-nourished. No distress.  HENT:  Head: Normocephalic and atraumatic.  Eyes: Conjunctivae normal and EOM are normal. Pupils are equal, round, and reactive to light. No scleral icterus.  Neck: Normal range of motion.  Cardiovascular: Normal rate, regular rhythm and normal heart sounds.  Exam reveals no gallop and no friction rub.   No murmur heard. Pulmonary/Chest: Effort normal and breath sounds normal. No respiratory distress.  Abdominal: Soft. Bowel sounds are normal. She exhibits no distension and no mass. There is no tenderness. There is no guarding.  Neurological: She is alert and oriented to person, place, and time.  Skin: Skin is warm and dry. She is not diaphoretic.   pf ED Course  Procedures (including critical care time) Labs Review Labs Reviewed  URINALYSIS, ROUTINE W REFLEX MICROSCOPIC (NOT AT [facility]) - Abnormal; Notable for the following:    APPearance CLOUDY (*)    Leukocytes, UA LARGE (*)    All other components within normal limits  URINE MICROSCOPIC-ADD ON - Abnormal; Notable for the following:    Squamous Epithelial / LPF 6-30 (*)    Bacteria, UA FEW (*)    All other components within normal limits  PREGNANCY, URINE  RPR  HIV ANTIBODY (ROUTINE TESTING)    Imaging Review No results found. I have personally reviewed and evaluated these images and lab results as part of my medical decision-making.   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 She been treated with 2.4 million units of penicillin G IM.    Arthor Captain, PA-C 12/08/15 1614  Arby Barrette, MD 12/21/15 [phone]

## 2015-12-09 LAB — RPR: RPR Ser Ql: NONREACTIVE

## 2015-12-09 LAB — HIV ANTIBODY (ROUTINE TESTING W REFLEX): HIV Screen 4th Generation wRfx: NONREACTIVE

## 2015-12-13 NOTE — ED Notes (Signed)
Patient called for test results due to changing her phone # over the weekend.  ID verified by name, address, dob, and ssn.  Patient states she continues to have a yellow discharge.  Encouraged to be rechecked at the seven day mark after completing her last antibiotic, to return if symptoms worsened sooner.

## 2015-12-14 ENCOUNTER — Encounter: Payer: Self-pay | Admitting: Obstetrics & Gynecology

## 2015-12-14 ENCOUNTER — Ambulatory Visit (INDEPENDENT_AMBULATORY_CARE_PROVIDER_SITE_OTHER): Payer: Medicaid Other | Admitting: Obstetrics & Gynecology

## 2015-12-14 VITALS — BP 136/86 | HR 67 | Wt 164.1 lb

## 2015-12-14 DIAGNOSIS — Z3202 Encounter for pregnancy test, result negative: Secondary | ICD-10-CM | POA: Diagnosis not present

## 2015-12-14 DIAGNOSIS — Z30013 Encounter for initial prescription of injectable contraceptive: Secondary | ICD-10-CM

## 2015-12-14 DIAGNOSIS — Z3042 Encounter for surveillance of injectable contraceptive: Secondary | ICD-10-CM | POA: Diagnosis not present

## 2015-12-14 DIAGNOSIS — N898 Other specified noninflammatory disorders of vagina: Secondary | ICD-10-CM | POA: Diagnosis present

## 2015-12-14 LAB — POCT PREGNANCY, URINE: Preg Test, Ur: NEGATIVE

## 2015-12-14 MED ORDER — MEDROXYPROGESTERONE ACETATE 150 MG/ML IM SUSP
150.0000 mg | INTRAMUSCULAR | Status: DC
  Administered 2015-12-14: 150 mg via INTRAMUSCULAR

## 2015-12-14 NOTE — Patient Instructions (Signed)
Gonorrhea Gonorrhea is an infection that can cause serious problems. If left untreated, the infection may:   Damage the female or female organs.   Cause women to be unable to have children (sterility).   Harm a fetus if the infected woman is pregnant.  It is important to get treatment for gonorrhea as soon as possible. It is also necessary that all your sexual partners be tested for the infection.  CAUSES  Gonorrhea is caused by bacteria called Neisseria gonorrhoeae. The infection is spread from person to person, usually by sexual contact (such as by anal, vaginal, or oral means). A newborn can contract the infection from his or her mother during birth.  RISK FACTORS  Being a woman younger than 30 years of age who is sexually active.  Being a woman 25 years of age or older who has:  A new sex partner.  More than one sex partner.  A sex partner who has a sexually transmitted disease (STD).  Using condoms inconsistently.  Currently having, or having previously had, an STD.  Exchanging sex or money or drugs. SYMPTOMS  Some people with gonorrhea do not have symptoms. Symptoms may be different in females and males.  Females The most common symptoms are:   Pain in the lower abdomen.   Fever with or without chills.  Other symptoms include:   Abnormal vaginal discharge.   Painful intercourse.   Burning or itching of the vagina or lips of the vagina.   Abnormal vaginal bleeding.   Pain when urinating.   Long-lasting (chronic) pain in the lower abdomen, especially during menstruation or intercourse.   Inability to become pregnant.   Going into premature labor.   Irritation, pain, bleeding, or discharge from the rectum. This may occur if the infection was spread by anal sex.   Sore throat or swollen lymph nodes in the neck. This may occur if the infection was spread by oral sex.  Males The most common symptoms are:   Discharge from the penis.   Pain  or burning during urination.   Pain or swelling in the testicles. Other symptoms may include:   Irritation, pain, bleeding, or discharge from the rectum. This may occur if the infection was spread by anal sex.   Sore throat, fever, or swollen lymph nodes in the neck. This may occur if the infection was spread by oral sex.  DIAGNOSIS  A diagnosis is made after a physical exam is done and a sample of discharge is examined under a microscope for the presence of the bacteria. The discharge may be taken from the urethra, cervix, throat, or rectum.  TREATMENT  Gonorrhea is treated with antibiotic medicines. It is important for treatment to begin as soon as possible. Early treatment may prevent some problems from developing. Do not have sex. Avoid all types of sexual activity for 7 days after treatment is complete and until any sex partners have been treated. HOME CARE INSTRUCTIONS   Take medicines only as directed by your health care provider.   Take your antibiotic medicine as directed by your health care provider. Finish the antibiotic even if you start to feel better. Incomplete treatment will put you at risk for continued infection.   Do not have sex until treatment is complete or as directed by your health care provider.   Keep all follow-up visits as directed by your health care provider.   Not all test results are available during your visit. If your test results are not back   during the visit, make an appointment with your health care provider to find out the results. Do not assume everything is normal if you have not heard from your health care provider or the medical facility. It is your responsibility to get your test results.  If you test positive for gonorrhea, inform your recent sexual partners. They need to be checked for gonorrhea even if they do not have symptoms. They may need treatment, even if they test negative for gonorrhea.  SEEK MEDICAL CARE IF:   You develop any  bad reaction to the medicine you were prescribed. This may include:   A rash.   Nausea.   Vomiting.   Diarrhea.   Your symptoms do not improve after a few days of taking antibiotics.   Your symptoms get worse.   You develop increased pain, such as in the testicles (for males) or in the abdomen (for females).  You have a fever. MAKE SURE YOU:   Understand these instructions.  Will watch your condition.  Will get help right away if you are not doing well or get worse.   This information is not intended to replace advice given to you by your health care provider. Make sure you discuss any questions you have with your health care provider.   Document Released: 07/26/2000 Document Revised: 08/19/2014 Document Reviewed: 02/03/2013 Elsevier Interactive Patient Education 2016 [employer] Inc.  

## 2015-12-14 NOTE — Progress Notes (Signed)
Patient ID: Shelly Gibbs, female   DOB: February 01, 1986, 30 y.o.   MRN: [id number]  Chief Complaint  Patient presents with  . Gynecologic Exam  . Vaginal Discharge  . Contraception  wants to start DMPA  HPI Shelly Gibbs is a 30 y.o. female.  [id number] Patient's last menstrual period was 11/18/2015. Treated for gonorrhea 4/26 and as contact for syphilis. Still with vaginal discharge after dx of BV and completed ABX  HPI  Past Medical History  Diagnosis Date  . Chlamydia   . UTI (lower urinary tract infection)   . Yeast infection   . Headache(784.0)   . BV (bacterial vaginosis)   . Gonorrhea 08-03-2012  . Asthma     inhaler last used "long time ago"    Past Surgical History  Procedure Laterality Date  . Induced abortion      x 2  . Dilation and curettage of uterus      Family History  Problem Relation Age of Onset  . Diabetes Maternal Aunt   . Anesthesia problems Neg Hx   . Other Neg Hx   . Hearing loss Neg Hx     Social History Social History  Substance Use Topics  . Smoking status: Never Smoker   . Smokeless tobacco: Never Used  . Alcohol Use: Yes     Comment: occasional alcohol    Allergies  Allergen Reactions  . Latex Itching    Burning   . Tylenol [Acetaminophen] Hives    Patient states has taken Percocet in past and had no issues/no hives. Hives only when taken actual Brand name Tylenol.    Current Outpatient Prescriptions  Medication Sig Dispense Refill  . clindamycin (CLEOCIN) 300 MG capsule Take 1 capsule (300 mg total) by mouth 2 (two) times daily. (Patient not taking: Reported on 12/14/2015) 14 capsule 0   Current Facility-Administered Medications  Medication Dose Route Frequency Provider Last Rate Last Dose  . medroxyPROGESTERone (DEPO-PROVERA) injection 150 mg  150 mg Intramuscular Q90 days Adam PhenixJames G Cale Decarolis, MD        Review of Systems Review of Systems  Constitutional: Negative.   Respiratory: Negative.   Genitourinary: Positive for  vaginal discharge. Negative for vaginal bleeding and pelvic pain.    Blood pressure 136/86, pulse 67, weight 164 lb 1.6 oz (74.435 kg), last menstrual period 11/18/2015, unknown if currently breastfeeding.  Physical Exam Physical Exam  Constitutional: She appears well-developed. No distress.  Cardiovascular: Normal rate.   Genitourinary: Vaginal discharge (white, wet prep done) found.  Neurological: She is alert.  Skin: Skin is warm and dry.  Psychiatric: She has a normal mood and affect. Her behavior is normal.    Data Reviewed Lab result UPT neg  Assessment    Recent STD treatment Screen for vaginitis with vaginal discharge     Plan    Wet prep f/u DMPA 150 mg IM and Q 3 mo TOC 6 weeks        Shelly Gibbs 12/14/2015, 1:38 PM

## 2015-12-15 LAB — WET PREP, GENITAL
Trich, Wet Prep: NONE SEEN
Yeast Wet Prep HPF POC: NONE SEEN

## 2015-12-20 ENCOUNTER — Telehealth: Payer: Self-pay | Admitting: General Practice

## 2015-12-20 DIAGNOSIS — N760 Acute vaginitis: Principal | ICD-10-CM

## 2015-12-20 DIAGNOSIS — B9689 Other specified bacterial agents as the cause of diseases classified elsewhere: Secondary | ICD-10-CM

## 2015-12-20 MED ORDER — CLINDAMYCIN HCL 300 MG PO CAPS: 300.0000 mg | ORAL_CAPSULE | Freq: Two times a day (BID) | ORAL | Status: DC

## 2015-12-20 NOTE — Telephone Encounter (Signed)
Called patient & informed her of wet prep results. Patient states she is still having the discharge with odor since 3 weeks ago and it hasn't improved. Discussed with Dr Erin FullingHarraway Smith who states we can refill clindamycin. Informed patient. Patient verbalized understanding & had no questions

## 2016-01-26 ENCOUNTER — Ambulatory Visit: Payer: Medicaid Other | Admitting: Obstetrics & Gynecology

## 2016-03-02 ENCOUNTER — Inpatient Hospital Stay (HOSPITAL_COMMUNITY)
Admission: AD | Admit: 2016-03-02 | Discharge: 2016-03-03 | Disposition: A | Discharge status: Home / Self Care | Payer: Medicaid Other | Source: Ambulatory Visit | Attending: Family Medicine | Admitting: Family Medicine

## 2016-03-02 DIAGNOSIS — Z79899 Other long term (current) drug therapy: Secondary | ICD-10-CM | POA: Insufficient documentation

## 2016-03-02 DIAGNOSIS — B9689 Other specified bacterial agents as the cause of diseases classified elsewhere: Secondary | ICD-10-CM

## 2016-03-02 DIAGNOSIS — N760 Acute vaginitis: Secondary | ICD-10-CM | POA: Insufficient documentation

## 2016-03-02 DIAGNOSIS — A499 Bacterial infection, unspecified: Secondary | ICD-10-CM | POA: Diagnosis not present

## 2016-03-02 DIAGNOSIS — N898 Other specified noninflammatory disorders of vagina: Secondary | ICD-10-CM | POA: Diagnosis present

## 2016-03-03 ENCOUNTER — Encounter (HOSPITAL_COMMUNITY): Payer: Self-pay | Admitting: *Deleted

## 2016-03-03 DIAGNOSIS — N760 Acute vaginitis: Secondary | ICD-10-CM | POA: Diagnosis not present

## 2016-03-03 DIAGNOSIS — A499 Bacterial infection, unspecified: Secondary | ICD-10-CM

## 2016-03-03 LAB — WET PREP, GENITAL
Sperm: NONE SEEN
Trich, Wet Prep: NONE SEEN
Yeast Wet Prep HPF POC: NONE SEEN

## 2016-03-03 LAB — URINALYSIS, ROUTINE W REFLEX MICROSCOPIC
Bilirubin Urine: NEGATIVE
Glucose, UA: NEGATIVE mg/dL
Hgb urine dipstick: NEGATIVE
Ketones, ur: NEGATIVE mg/dL
Leukocytes, UA: NEGATIVE
Nitrite: NEGATIVE
Protein, ur: NEGATIVE mg/dL
Specific Gravity, Urine: 1.025 (ref 1.005–1.030)
pH: 5.5 (ref 5.0–8.0)

## 2016-03-03 LAB — POCT PREGNANCY, URINE: Preg Test, Ur: NEGATIVE

## 2016-03-03 MED ORDER — METRONIDAZOLE 0.75 % VA GEL: 1.0000 | Freq: Two times a day (BID) | VAGINAL | 0 refills | Status: DC

## 2016-03-03 NOTE — Discharge Instructions (Signed)

## 2016-03-03 NOTE — MAU Provider Note (Signed)
History     CSN: [id number]  Arrival date and time: 03/02/16 2333   None     Chief Complaint  Patient presents with  . Vaginal Discharge   HPI Christan L Shibuya is a 30 y.o. female who presents for vaginal discharge. Reports recurrent BV infections. Current episode of foul smelling vaginal discharge since last week. Does not use condoms with partner. Denies vaginal irritation, vaginal bleeding, dyspareunia, postcoital bleeding, or dysuria. Current partner is same partner when she had gonorrhea & syphilis; pt was treated for both & declines testing today.   OB History    Gravida Para Term Preterm AB Living   7 2 2  0 5 2   SAB TAB Ectopic Multiple Live Births   1 4 0 0        Past Medical History  Diagnosis Date  . Chlamydia   . UTI (lower urinary tract infection)   . Yeast infection   . Headache(784.0)   . BV (bacterial vaginosis)   . Gonorrhea 08-03-2012  . Asthma     inhaler last used "long time ago"    Past Surgical History  Procedure Laterality Date  . Induced abortion      x 2  . Dilation and curettage of uterus      Family History  Problem Relation Age of Onset  . Diabetes Maternal Aunt   . Anesthesia problems Neg Hx   . Other Neg Hx   . Hearing loss Neg Hx     Social History  Substance Use Topics  . Smoking status: Never Smoker   . Smokeless tobacco: Never Used  . Alcohol Use: Yes     Comment: occasional alcohol    Allergies:  Allergies  Allergen Reactions  . Latex Itching    Burning   . Tylenol [Acetaminophen] Hives    Patient states has taken Percocet in past and had no issues/no hives. Hives only when taken actual Brand name Tylenol.    Facility-administered medications prior to admission  Medication Dose Route Frequency Provider Last Rate Last Dose  . medroxyPROGESTERone (DEPO-PROVERA) injection 150 mg  150 mg Intramuscular Q90 days Adam Phenix, MD   150 mg at 12/14/15 1400   Prescriptions prior to admission  Medication Sig  Dispense Refill Last Dose  . clindamycin (CLEOCIN) 300 MG capsule Take 1 capsule (300 mg total) by mouth 2 (two) times daily. 14 capsule 0     Review of Systems  Constitutional: Negative for chills and fever.  Gastrointestinal: Negative.   Genitourinary: Negative for dysuria.       + vaginal discharge with odor Denies vaginal irritation, vaginal bleeding, dyspareunia, or post coital bleeding   Physical Exam   Blood pressure 131/82, pulse 75, temperature 98.2 F (36.8 C), temperature source Oral, resp. rate 14, height 5\' 1"  (1.549 m), weight 168 lb 9.6 oz (76.5 kg), SpO2 100 %, unknown if currently breastfeeding.  Physical Exam  Nursing note and vitals reviewed. Constitutional: She is oriented to person, place, and time. She appears well-developed and well-nourished. No distress.  HENT:  Head: Normocephalic and atraumatic.  Eyes: Conjunctivae are normal. Right eye exhibits no discharge. Left eye exhibits no discharge. No scleral icterus.  Neck: Normal range of motion.  Cardiovascular: Normal rate.   Respiratory: Effort normal. No respiratory distress.  GI: Soft. She exhibits no distension. There is no tenderness.  Genitourinary: Uterus normal. Cervix exhibits no motion tenderness. Right adnexum displays no mass, no tenderness and no fullness. Left adnexum displays  no mass, no tenderness and no fullness.  Neurological: She is alert and oriented to person, place, and time.  Skin: Skin is warm and dry. She is not diaphoretic.  Psychiatric: She has a normal mood and affect. Her behavior is normal. Judgment and thought content normal.    MAU Course  Procedures Results for orders placed or performed during the hospital encounter of 03/02/16 (from the past 24 hour(s))  Urinalysis, Routine w reflex microscopic (not at [facility])     Status: None   Collection Time: 03/03/16 12:03 AM  Result Value Ref Range   Color, Urine YELLOW YELLOW   APPearance CLEAR CLEAR   Specific Gravity, Urine 1.025  1.005 - 1.030   pH 5.5 5.0 - 8.0   Glucose, UA NEGATIVE NEGATIVE mg/dL   Hgb urine dipstick NEGATIVE NEGATIVE   Bilirubin Urine NEGATIVE NEGATIVE   Ketones, ur NEGATIVE NEGATIVE mg/dL   Protein, ur NEGATIVE NEGATIVE mg/dL   Nitrite NEGATIVE NEGATIVE   Leukocytes, UA NEGATIVE NEGATIVE  Wet prep, genital     Status: Abnormal   Collection Time: 03/03/16 12:15 AM  Result Value Ref Range   Yeast Wet Prep HPF POC NONE SEEN NONE SEEN   Trich, Wet Prep NONE SEEN NONE SEEN   Clue Cells Wet Prep HPF POC PRESENT (A) NONE SEEN   WBC, Wet Prep HPF POC FEW (A) NONE SEEN   Sperm NONE SEEN   Pregnancy, urine POC     Status: None   Collection Time: 03/03/16 12:18 AM  Result Value Ref Range   Preg Test, Ur NEGATIVE NEGATIVE    MDM UPT negative U/a, GC/CT, wet prep  Assessment and Plan  A: 1. BV (bacterial vaginosis)     P: Discharge home Rx metrogel Use condoms - if symptoms don't improve with condom usage, f/u with gyn GC/CT pending Discussed reasons to return to MAU vs PCP/gyn  Judeth Horn 03/03/2016, 12:11 AM

## 2016-03-03 NOTE — MAU Note (Signed)
Wet prep and GC collected.

## 2016-03-04 LAB — GC/CHLAMYDIA PROBE AMP (~~LOC~~) NOT AT ARMC
Chlamydia: NEGATIVE
Neisseria Gonorrhea: NEGATIVE

## 2016-04-01 ENCOUNTER — Inpatient Hospital Stay (HOSPITAL_COMMUNITY)
Admission: AD | Admit: 2016-04-01 | Discharge: 2016-04-01 | Disposition: A | Discharge status: Home / Self Care | Payer: Medicaid Other | Source: Ambulatory Visit | Attending: Family Medicine | Admitting: Family Medicine

## 2016-04-01 ENCOUNTER — Encounter (HOSPITAL_COMMUNITY): Payer: Self-pay | Admitting: *Deleted

## 2016-04-01 DIAGNOSIS — B373 Candidiasis of vulva and vagina: Secondary | ICD-10-CM | POA: Diagnosis not present

## 2016-04-01 DIAGNOSIS — N898 Other specified noninflammatory disorders of vagina: Secondary | ICD-10-CM | POA: Diagnosis present

## 2016-04-01 DIAGNOSIS — B3731 Acute candidiasis of vulva and vagina: Secondary | ICD-10-CM

## 2016-04-01 LAB — WET PREP, GENITAL
Clue Cells Wet Prep HPF POC: NONE SEEN
Sperm: NONE SEEN
Trich, Wet Prep: NONE SEEN
Yeast Wet Prep HPF POC: NONE SEEN

## 2016-04-01 LAB — URINALYSIS, ROUTINE W REFLEX MICROSCOPIC
Bilirubin Urine: NEGATIVE
Glucose, UA: NEGATIVE mg/dL
Hgb urine dipstick: NEGATIVE
Ketones, ur: 15 mg/dL — AB
Leukocytes, UA: NEGATIVE
Nitrite: NEGATIVE
Protein, ur: NEGATIVE mg/dL
Specific Gravity, Urine: 1.02 (ref 1.005–1.030)
pH: 8 (ref 5.0–8.0)

## 2016-04-01 LAB — POCT PREGNANCY, URINE: Preg Test, Ur: NEGATIVE

## 2016-04-01 MED ORDER — FLUCONAZOLE 150 MG PO TABS: 150.0000 mg | ORAL_TABLET | Freq: Once | ORAL | 0 refills | Status: AC

## 2016-04-01 NOTE — Discharge Instructions (Signed)

## 2016-04-01 NOTE — MAU Provider Note (Signed)
History     CSN: [id number]  Arrival date and time: 04/01/16 [id number]   First Provider Initiated Contact with Patient 04/01/16 2228      Chief Complaint  Patient presents with  . Vaginal Discharge   Vaginal Discharge  The patient's primary symptoms include vaginal discharge. This is a new problem. The current episode started more than 1 month ago. The problem occurs constantly. The patient is experiencing no pain. She is not pregnant. Associated symptoms include nausea (feels like the Depo has made her nauseous ). Pertinent negatives include no abdominal pain, chills, constipation, diarrhea, dysuria, fever, frequency, urgency or vomiting. The vaginal discharge was malodorous, white, thick and brown. There has been no bleeding. Nothing aggravates the symptoms. She has tried nothing (used metrogel about one month ago, but states that after using it sheis now irritated and itching. Unsure if she has yeast or of BV did not go away. ) for the symptoms. Her menstrual history has been irregular (LMP 12/26/15 ).    Past Medical History:  Diagnosis Date  . Asthma    inhaler last used "long time ago"  . BV (bacterial vaginosis)   . Chlamydia   . Gonorrhea 08-03-2012  . Headache(784.0)   . UTI (lower urinary tract infection)   . Yeast infection     Past Surgical History:  Procedure Laterality Date  . DILATION AND CURETTAGE OF UTERUS    . INDUCED ABORTION     x 2    Family History  Problem Relation Age of Onset  . Diabetes Maternal Aunt   . Anesthesia problems Neg Hx   . Other Neg Hx   . Hearing loss Neg Hx     Social History  Substance Use Topics  . Smoking status: Never Smoker  . Smokeless tobacco: Never Used  . Alcohol use Yes     Comment: occasional alcohol    Allergies:  Allergies  Allergen Reactions  . Latex Itching    Burning   . Tylenol [Acetaminophen] Hives    Patient states has taken Percocet in past and had no issues/no hives. Hives only when taken actual Brand  name Tylenol.    Facility-Administered Medications Prior to Admission  Medication Dose Route Frequency Provider Last Rate Last Dose  . medroxyPROGESTERone (DEPO-PROVERA) injection 150 mg  150 mg Intramuscular Q90 days Adam PhenixJames G Arnold, MD   150 mg at 12/14/15 1400   Prescriptions Prior to Admission  Medication Sig Dispense Refill Last Dose  . metroNIDAZOLE (METROGEL VAGINAL) 0.75 % vaginal gel Place 1 Applicatorful vaginally 2 (two) times daily. 70 g 0     Review of Systems  Constitutional: Negative for chills and fever.  Gastrointestinal: Positive for nausea (feels like the Depo has made her nauseous ). Negative for abdominal pain, constipation, diarrhea and vomiting.  Genitourinary: Positive for vaginal discharge. Negative for dysuria, frequency and urgency.   Physical Exam   Blood pressure 118/81, pulse 65, temperature 98.6 F (37 C), temperature source Oral, resp. rate 18, height 5\' 2"  (1.575 m), weight 174 lb (78.9 kg), last menstrual period 12/26/2015, SpO2 100 %, unknown if currently breastfeeding.  Physical Exam  Nursing note and vitals reviewed. Constitutional: She is oriented to person, place, and time. She appears well-developed and well-nourished. No distress.  HENT:  Head: Normocephalic.  Cardiovascular: Normal rate.   Respiratory: Effort normal.  GI: Soft. There is no tenderness. There is no rebound.  Neurological: She is alert and oriented to person, place, and time.  Skin: Skin  is warm and dry.  Psychiatric: She has a normal mood and affect.   Results for orders placed or performed during the hospital encounter of 04/01/16 (from the past 24 hour(s))  Urinalysis, Routine w reflex microscopic ([facility])     Status: Abnormal   Collection Time: 04/01/16  8:34 PM  Result Value Ref Range   Color, Urine YELLOW YELLOW   APPearance CLEAR CLEAR   Specific Gravity, Urine 1.020 1.005 - 1.030   pH 8.0 5.0 - 8.0   Glucose, UA NEGATIVE NEGATIVE mg/dL   Hgb urine dipstick  NEGATIVE NEGATIVE   Bilirubin Urine NEGATIVE NEGATIVE   Ketones, ur 15 (A) NEGATIVE mg/dL   Protein, ur NEGATIVE NEGATIVE mg/dL   Nitrite NEGATIVE NEGATIVE   Leukocytes, UA NEGATIVE NEGATIVE  Pregnancy, urine POC     Status: None   Collection Time: 04/01/16  8:52 PM  Result Value Ref Range   Preg Test, Ur NEGATIVE NEGATIVE  Wet prep, genital     Status: Abnormal   Collection Time: 04/01/16 10:38 PM  Result Value Ref Range   Yeast Wet Prep HPF POC NONE SEEN NONE SEEN   Trich, Wet Prep NONE SEEN NONE SEEN   Clue Cells Wet Prep HPF POC NONE SEEN NONE SEEN   WBC, Wet Prep HPF POC FEW (A) NONE SEEN   Sperm NONE SEEN    MAU Course  Procedures  MDM   Assessment and Plan   1. Yeast infection involving the vagina and surrounding area    DC home Comfort measures reviewed  RX: diflucan as directed Return to MAU as needed FU with OB as planned  Follow-up Information    [facility]   Specialty:  Obstetrics and Gynecology Contact information: [address] [place] [id number] [phone]           Shelly Gibbs, Shelly Gibbs 04/01/2016, 10:30 PM

## 2016-04-01 NOTE — MAU Note (Signed)
Pt reports she was seen here a month ago and used the gel that was prescribed but has continued to have discharge and odor and now has vaginal irritation. Missed her Depo shot on 08/04

## 2016-04-02 LAB — GC/CHLAMYDIA PROBE AMP (~~LOC~~) NOT AT ARMC
Chlamydia: NEGATIVE
Neisseria Gonorrhea: NEGATIVE

## 2016-05-28 ENCOUNTER — Inpatient Hospital Stay (HOSPITAL_COMMUNITY): Payer: Medicaid Other

## 2016-05-28 ENCOUNTER — Encounter (HOSPITAL_COMMUNITY): Payer: Self-pay | Admitting: *Deleted

## 2016-05-28 ENCOUNTER — Inpatient Hospital Stay (HOSPITAL_COMMUNITY)
Admission: AD | Admit: 2016-05-28 | Discharge: 2016-05-28 | Disposition: A | Discharge status: Home / Self Care | Payer: Medicaid Other | Source: Ambulatory Visit | Attending: Obstetrics & Gynecology | Admitting: Obstetrics & Gynecology

## 2016-05-28 DIAGNOSIS — O209 Hemorrhage in early pregnancy, unspecified: Secondary | ICD-10-CM | POA: Diagnosis not present

## 2016-05-28 DIAGNOSIS — A5901 Trichomonal vulvovaginitis: Secondary | ICD-10-CM | POA: Diagnosis not present

## 2016-05-28 DIAGNOSIS — O98311 Other infections with a predominantly sexual mode of transmission complicating pregnancy, first trimester: Secondary | ICD-10-CM | POA: Diagnosis not present

## 2016-05-28 DIAGNOSIS — Z3A01 Less than 8 weeks gestation of pregnancy: Secondary | ICD-10-CM | POA: Diagnosis not present

## 2016-05-28 DIAGNOSIS — Z6790 Unspecified blood type, Rh positive: Secondary | ICD-10-CM | POA: Insufficient documentation

## 2016-05-28 DIAGNOSIS — O4691 Antepartum hemorrhage, unspecified, first trimester: Secondary | ICD-10-CM | POA: Diagnosis not present

## 2016-05-28 DIAGNOSIS — O4690 Antepartum hemorrhage, unspecified, unspecified trimester: Secondary | ICD-10-CM

## 2016-05-28 DIAGNOSIS — A599 Trichomoniasis, unspecified: Secondary | ICD-10-CM

## 2016-05-28 DIAGNOSIS — O3680X0 Pregnancy with inconclusive fetal viability, not applicable or unspecified: Secondary | ICD-10-CM

## 2016-05-28 DIAGNOSIS — O26891 Other specified pregnancy related conditions, first trimester: Secondary | ICD-10-CM | POA: Diagnosis not present

## 2016-05-28 DIAGNOSIS — N939 Abnormal uterine and vaginal bleeding, unspecified: Secondary | ICD-10-CM

## 2016-05-28 DIAGNOSIS — R109 Unspecified abdominal pain: Secondary | ICD-10-CM | POA: Diagnosis present

## 2016-05-28 LAB — CBC
HCT: 39.1 % (ref 36.0–46.0)
Hemoglobin: 12.8 g/dL (ref 12.0–15.0)
MCH: 24.8 pg — ABNORMAL LOW (ref 26.0–34.0)
MCHC: 32.7 g/dL (ref 30.0–36.0)
MCV: 75.8 fL — ABNORMAL LOW (ref 78.0–100.0)
Platelets: 286 10*3/uL (ref 150–400)
RBC: 5.16 MIL/uL — ABNORMAL HIGH (ref 3.87–5.11)
RDW: 14.6 % (ref 11.5–15.5)
WBC: 6.1 10*3/uL (ref 4.0–10.5)

## 2016-05-28 LAB — URINALYSIS, ROUTINE W REFLEX MICROSCOPIC
Bilirubin Urine: NEGATIVE
Glucose, UA: NEGATIVE mg/dL
Ketones, ur: 15 mg/dL — AB
Nitrite: NEGATIVE
Protein, ur: NEGATIVE mg/dL
Specific Gravity, Urine: 1.025 (ref 1.005–1.030)
pH: 6 (ref 5.0–8.0)

## 2016-05-28 LAB — WET PREP, GENITAL
Clue Cells Wet Prep HPF POC: NONE SEEN
Sperm: NONE SEEN
Yeast Wet Prep HPF POC: NONE SEEN

## 2016-05-28 LAB — URINE MICROSCOPIC-ADD ON: RBC / HPF: NONE SEEN RBC/hpf (ref 0–5)

## 2016-05-28 LAB — HCG, QUANTITATIVE, PREGNANCY: hCG, Beta Chain, Quant, S: 559 m[IU]/mL — ABNORMAL HIGH (ref <5)

## 2016-05-28 LAB — ABO/RH: ABO/RH(D): A POS

## 2016-05-28 LAB — POCT PREGNANCY, URINE: Preg Test, Ur: POSITIVE — AB

## 2016-05-28 MED ORDER — METRONIDAZOLE 500 MG PO TABS
2000.0000 mg | ORAL_TABLET | Freq: Once | ORAL | Status: AC
  Administered 2016-05-28: 2000 mg via ORAL
  Filled 2016-05-28: qty 4

## 2016-05-28 MED ORDER — ONDANSETRON HCL 4 MG PO TABS
4.0000 mg | ORAL_TABLET | Freq: Once | ORAL | Status: AC
  Administered 2016-05-28: 4 mg via ORAL
  Filled 2016-05-28: qty 1

## 2016-05-28 NOTE — MAU Provider Note (Signed)
History     CSN: [id number]  Arrival date and time: 05/28/16 1053   First Provider Initiated Contact with Patient 05/28/16 1147      Chief Complaint  Patient presents with  . Abdominal Pain  . Vaginal Bleeding  . Possible Pregnancy   [id number] @ 965w3d by LMP c/o dark red bleeding that started 3 days sgo. She had +HPT 2 days ago. Bleeding has become more like spotting and light pink over the last 2 days. She denies fever. She reports brown vaginal discharge with malodor prior to bleeding. She also reports lower abdominal cramping and radiating to lower back. She did not use OTC analgesic.    OB History    Gravida Para Term Preterm AB Living   7 2 2  0 5 2   SAB TAB Ectopic Multiple Live Births   1 4 0 0 1      Past Medical History:  Diagnosis Date  . Asthma    inhaler last used "long time ago"  . BV (bacterial vaginosis)   . Chlamydia   . Gonorrhea 08-03-2012  . Headache(784.0)   . UTI (lower urinary tract infection)   . Yeast infection     Past Surgical History:  Procedure Laterality Date  . DILATION AND CURETTAGE OF UTERUS    . INDUCED ABORTION     x 2    Family History  Problem Relation Age of Onset  . Diabetes Maternal Aunt   . Anesthesia problems Neg Hx   . Other Neg Hx   . Hearing loss Neg Hx     Social History  Substance Use Topics  . Smoking status: Never Smoker  . Smokeless tobacco: Never Used  . Alcohol use Yes     Comment: occasional alcohol    Allergies:  Allergies  Allergen Reactions  . Latex Itching    Burning   . Tylenol [Acetaminophen] Hives    Patient states has taken Percocet in past and had no issues/no hives. Hives only when taken actual Brand name Tylenol.    Facility-Administered Medications Prior to Admission  Medication Dose Route Frequency Provider Last Rate Last Dose  . medroxyPROGESTERone (DEPO-PROVERA) injection 150 mg  150 mg Intramuscular Q90 days Adam PhenixJames G Arnold, MD   150 mg at 12/14/15 1400   Prescriptions Prior  to Admission  Medication Sig Dispense Refill Last Dose  . metroNIDAZOLE (METROGEL VAGINAL) 0.75 % vaginal gel Place 1 Applicatorful vaginally 2 (two) times daily. 70 g 0     Review of Systems  Constitutional: Negative.   Gastrointestinal: Positive for abdominal pain.  Genitourinary: Negative.   Musculoskeletal: Positive for back pain.  Neurological: Positive for headaches.   Physical Exam   Blood pressure 125/87, pulse 91, temperature 98.4 F (36.9 C), temperature source Oral, resp. rate 18, height 5\' 2"  (1.575 m), weight 79.6 kg (175 lb 8 oz), last menstrual period 05/04/2016, unknown if currently breastfeeding.  Physical Exam  Constitutional: She is oriented to person, place, and time. She appears well-developed and well-nourished.  HENT:  Head: Normocephalic and atraumatic.  Neck: Normal range of motion. Neck supple.  Cardiovascular: Normal rate.   Respiratory: Effort normal.  GI: Soft. She exhibits no distension. There is no tenderness.  Genitourinary:  Genitourinary Comments: External: no lesions Vagina: rugated, parous, thin brown malodorous discharge Uterus: non enlarged, anteverted, non tender, no CMT Adnexae: no masses, no tenderness left, no tenderness right   Musculoskeletal: Normal range of motion.  Neurological: She is alert and oriented to person,  place, and time.  Skin: Skin is warm and dry.  Psychiatric: She has a normal mood and affect.   Results for orders placed or performed during the hospital encounter of 05/28/16 (from the past 24 hour(s))  Urinalysis, Routine w reflex microscopic (not at [facility])     Status: Abnormal   Collection Time: 05/28/16 11:05 AM  Result Value Ref Range   Color, Urine YELLOW YELLOW   APPearance CLEAR CLEAR   Specific Gravity, Urine 1.025 1.005 - 1.030   pH 6.0 5.0 - 8.0   Glucose, UA NEGATIVE NEGATIVE mg/dL   Hgb urine dipstick SMALL (A) NEGATIVE   Bilirubin Urine NEGATIVE NEGATIVE   Ketones, ur 15 (A) NEGATIVE mg/dL    Protein, ur NEGATIVE NEGATIVE mg/dL   Nitrite NEGATIVE NEGATIVE   Leukocytes, UA MODERATE (A) NEGATIVE  Urine microscopic-add on     Status: Abnormal   Collection Time: 05/28/16 11:05 AM  Result Value Ref Range   Squamous Epithelial / LPF 0-5 (A) NONE SEEN   WBC, UA 0-5 0 - 5 WBC/hpf   RBC / HPF NONE SEEN 0 - 5 RBC/hpf   Bacteria, UA RARE (A) NONE SEEN  Pregnancy, urine POC     Status: Abnormal   Collection Time: 05/28/16 11:23 AM  Result Value Ref Range   Preg Test, Ur POSITIVE (A) NEGATIVE  hCG, quantitative, pregnancy     Status: Abnormal   Collection Time: 05/28/16 11:58 AM  Result Value Ref Range   hCG, Beta Chain, Quant, S 559 (H) <5 mIU/mL  ABO/Rh     Status: None   Collection Time: 05/28/16 11:58 AM  Result Value Ref Range   ABO/RH(D) A POS   CBC     Status: Abnormal   Collection Time: 05/28/16 11:58 AM  Result Value Ref Range   WBC 6.1 4.0 - 10.5 K/uL   RBC 5.16 (H) 3.87 - 5.11 MIL/uL   Hemoglobin 12.8 12.0 - 15.0 g/dL   HCT 16.1 09.6 - 04.5 %   MCV 75.8 (L) 78.0 - 100.0 fL   MCH 24.8 (L) 26.0 - 34.0 pg   MCHC 32.7 30.0 - 36.0 g/dL   RDW 40.9 81.1 - 91.4 %   Platelets 286 150 - 400 K/uL  Wet prep, genital     Status: Abnormal   Collection Time: 05/28/16 12:25 PM  Result Value Ref Range   Yeast Wet Prep HPF POC NONE SEEN NONE SEEN   Trich, Wet Prep PRESENT (A) NONE SEEN   Clue Cells Wet Prep HPF POC NONE SEEN NONE SEEN   WBC, Wet Prep HPF POC FEW (A) NONE SEEN   Sperm NONE SEEN    US Ob Comp Less 14 Wks  Result Date: 05/28/2016 CLINICAL DATA:  Vaginal bleeding and pain.  Beta HCG 559 EXAM: OBSTETRIC <14 WK [place] AND TRANSVAGINAL OB US TECHNIQUE: Both transabdominal and transvaginal ultrasound examinations were performed for complete evaluation of the gestation as well as the maternal uterus, adnexal regions, and pelvic cul-de-sac. Transvaginal technique was performed to assess early pregnancy. COMPARISON:  Ultrasound 05/27/2013 FINDINGS: Intrauterine gestational  sac: Not present Yolk sac:  Not present Embryo:  Not present Cardiac Activity: Not present Heart Rate: Not present  Bpm MSD:   mm    w     d CRL:    mm    w    d                  [place] EDC: Subchorionic hemorrhage:  None visualized. Maternal uterus/adnexae: Normal ovaries. Endometrium is homogeneous measuring 10 mm in diameter. Small amount of free fluid. IMPRESSION: No intrauterine pregnancy identified. No adnexal mass. This could represent early pregnancy, spontaneous abortion, or ectopic pregnancy. Close follow-up recommended. Electronically Signed   By: Marlan Palau M.D.   On: 05/28/2016 13:12   US Ob Transvaginal  Result Date: 05/28/2016 CLINICAL DATA:  Vaginal bleeding and pain.  Beta HCG 559 EXAM: OBSTETRIC <14 WK [place] AND TRANSVAGINAL OB US TECHNIQUE: Both transabdominal and transvaginal ultrasound examinations were performed for complete evaluation of the gestation as well as the maternal uterus, adnexal regions, and pelvic cul-de-sac. Transvaginal technique was performed to assess early pregnancy. COMPARISON:  Ultrasound 05/27/2013 FINDINGS: Intrauterine gestational sac: Not present Yolk sac:  Not present Embryo:  Not present Cardiac Activity: Not present Heart Rate: Not present  Bpm MSD:   mm    w     d CRL:    mm    w    d                  [place] EDC: Subchorionic hemorrhage:  None visualized. Maternal uterus/adnexae: Normal ovaries. Endometrium is homogeneous measuring 10 mm in diameter. Small amount of free fluid. IMPRESSION: No intrauterine pregnancy identified. No adnexal mass. This could represent early pregnancy, spontaneous abortion, or ectopic pregnancy. Close follow-up recommended. Electronically Signed   By: Marlan Palau M.D.   On: 05/28/2016 13:12   MAU Course  Procedures Flagyl 2 g po x1 Zofran 4 mg po x1  MDM Labs and [place] ordered and reviewed. No evidence of IUP, cannot r/o ectopic or early pregnancy. Plan for rpt quant in 2 days in [facility]. Treated trich. Discussed partner will need tx,  may go to HD if desires or PCP. Stable for discharge home.  Assessment and Plan   1. Vaginal bleeding in pregnancy   2. Vaginal bleeding   3. Pregnancy of unknown anatomic location   4. Blood type, Rh positive   5. Trichomonal infection    Discharge home Follow up in 2 days in [facility]   Medication List    STOP taking these medications   metroNIDAZOLE 0.75 % vaginal gel Commonly known as:  METROGEL VAGINAL       Donette Larry, CNM 05/28/2016, 12:15 PM

## 2016-05-28 NOTE — Discharge Instructions (Signed)
Trichomoniasis Trichomoniasis is an infection caused by an organism called Trichomonas. The infection can affect both women and men. In women, the outer female genitalia and the vagina are affected. In men, the penis is mainly affected, but the prostate and other reproductive organs can also be involved. Trichomoniasis is a sexually transmitted infection (STI) and is most often passed to another person through sexual contact.  RISK FACTORS  Having unprotected sexual intercourse.  Having sexual intercourse with an infected partner. SIGNS AND SYMPTOMS  Symptoms of trichomoniasis in women include:  Abnormal gray-green frothy vaginal discharge.  Itching and irritation of the vagina.  Itching and irritation of the area outside the vagina. Symptoms of trichomoniasis in men include:   Penile discharge with or without pain.  Pain during urination. This results from inflammation of the urethra. DIAGNOSIS  Trichomoniasis may be found during a Pap test or physical exam. Your health care provider may use one of the following methods to help diagnose this infection:  Testing the pH of the vagina with a test tape.  Using a vaginal swab test that checks for the Trichomonas organism. A test is available that provides results within a few minutes.  Examining a urine sample.  Testing vaginal secretions. Your health care provider may test you for other STIs, including HIV. TREATMENT   You may be given medicine to fight the infection. Women should inform their health care provider if they could be or are pregnant. Some medicines used to treat the infection should not be taken during pregnancy.  Your health care provider may recommend over-the-counter medicines or creams to decrease itching or irritation.  Your sexual partner will need to be treated if infected.  Your health care provider may test you for infection again 3 months after treatment. HOME CARE INSTRUCTIONS   Take medicines only as  directed by your health care provider.  Take over-the-counter medicine for itching or irritation as directed by your health care provider.  Do not have sexual intercourse while you have the infection.  Women should not douche or wear tampons while they have the infection.  Discuss your infection with your partner. Your partner may have gotten the infection from you, or you may have gotten it from your partner.  Have your sex partner get examined and treated if necessary.  Practice safe, informed, and protected sex.  See your health care provider for other STI testing. SEEK MEDICAL CARE IF:   You still have symptoms after you finish your medicine.  You develop abdominal pain.  You have pain when you urinate.  You have bleeding after sexual intercourse.  You develop a rash.  Your medicine makes you sick or makes you throw up (vomit). MAKE SURE YOU:  Understand these instructions.  Will watch your condition.  Will get help right away if you are not doing well or get worse.   This information is not intended to replace advice given to you by your health care provider. Make sure you discuss any questions you have with your health care provider.   Document Released: 01/22/2001 Document Revised: 08/19/2014 Document Reviewed: 05/10/2013 Elsevier Interactive Patient Education 2016 Elsevier Inc. Vaginal Bleeding During Pregnancy, First Trimester A small amount of bleeding (spotting) from the vagina is relatively common in early pregnancy. It usually stops on its own. Various things may cause bleeding or spotting in early pregnancy. Some bleeding may be related to the pregnancy, and some may not. In most cases, the bleeding is normal and is not a  problem. However, bleeding can also be a sign of something serious. Be sure to tell your health care provider about any vaginal bleeding right away. Some possible causes of vaginal bleeding during the first trimester include:  Infection or  inflammation of the cervix.  Growths (polyps) on the cervix.  Miscarriage or threatened miscarriage.  Pregnancy tissue has developed outside of the uterus and in a fallopian tube (tubal pregnancy).  Tiny cysts have developed in the uterus instead of pregnancy tissue (molar pregnancy). HOME CARE INSTRUCTIONS  Watch your condition for any changes. The following actions may help to lessen any discomfort you are feeling:  Follow your health care provider's instructions for limiting your activity. If your health care provider orders bed rest, you may need to stay in bed and only get up to use the bathroom. However, your health care provider may allow you to continue light activity.  If needed, make plans for someone to help with your regular activities and responsibilities while you are on bed rest.  Keep track of the number of pads you use each day, how often you change pads, and how soaked (saturated) they are. Write this down.  Do not use tampons. Do not douche.  Do not have sexual intercourse or orgasms until approved by your health care provider.  If you pass any tissue from your vagina, save the tissue so you can show it to your health care provider.  Only take over-the-counter or prescription medicines as directed by your health care provider.  Do not take aspirin because it can make you bleed.  Keep all follow-up appointments as directed by your health care provider. SEEK MEDICAL CARE IF:  You have any vaginal bleeding during any part of your pregnancy.  You have cramps or labor pains.  You have a fever, not controlled by medicine. SEEK IMMEDIATE MEDICAL CARE IF:   You have severe cramps in your back or belly (abdomen).  You pass large clots or tissue from your vagina.  Your bleeding increases.  You feel light-headed or weak, or you have fainting episodes.  You have chills.  You are leaking fluid or have a gush of fluid from your vagina.  You pass out while having  a bowel movement. MAKE SURE YOU:  Understand these instructions.  Will watch your condition.  Will get help right away if you are not doing well or get worse.   This information is not intended to replace advice given to you by your health care provider. Make sure you discuss any questions you have with your health care provider.   Document Released: 05/08/2005 Document Revised: 08/03/2013 Document Reviewed: 04/05/2013 Elsevier Interactive Patient Education [employer]2016 Elsevier Inc.

## 2016-05-28 NOTE — Progress Notes (Signed)
Written and verbal d/c instructions given and understanding voiced. Fabian NovemberM Bhambri CNM discussed d/c plan with pt earlier

## 2016-05-28 NOTE — Progress Notes (Signed)
E-signature not working so pt signed hardcoppy and with chart

## 2016-05-29 LAB — GC/CHLAMYDIA PROBE AMP (~~LOC~~) NOT AT ARMC
Chlamydia: NEGATIVE
Neisseria Gonorrhea: NEGATIVE

## 2016-05-30 ENCOUNTER — Ambulatory Visit: Payer: Self-pay | Admitting: General Practice

## 2016-05-30 ENCOUNTER — Telehealth: Payer: Self-pay | Admitting: General Practice

## 2016-05-30 DIAGNOSIS — O3680X0 Pregnancy with inconclusive fetal viability, not applicable or unspecified: Secondary | ICD-10-CM

## 2016-05-30 LAB — HCG, QUANTITATIVE, PREGNANCY: hCG, Beta Chain, Quant, S: 1409 m[IU]/mL — ABNORMAL HIGH (ref <5)

## 2016-05-30 NOTE — Telephone Encounter (Signed)
Called patient and informed her of bhcg results & ultrasound appt. Patient verbalized understanding & is aware she will have brief office visit following for u/s results.

## 2016-05-30 NOTE — Progress Notes (Signed)
Labs reviewed. Agree with plan.

## 2016-05-30 NOTE — Progress Notes (Signed)
Patient here for stat bhcg today. Patient denies bleeding or pain. Explained to patient I will call her in about 2 hours with results and to be near her phone. Patient provided number of [phone]. Spoke with Dr Adrian BlackwaterStinson who agrees to favorable rise in bhcg levels, patient needs ultrasound in 3 weeks. Scheduled for 11/2 @ 10am. Will call patient.

## 2016-06-09 ENCOUNTER — Telehealth (HOSPITAL_COMMUNITY): Payer: Self-pay | Admitting: Advanced Practice Midwife

## 2016-06-09 ENCOUNTER — Inpatient Hospital Stay (HOSPITAL_COMMUNITY)
Admission: AD | Admit: 2016-06-09 | Discharge: 2016-06-09 | Disposition: A | Discharge status: Home / Self Care | Payer: Medicaid Other | Source: Ambulatory Visit | Attending: Obstetrics & Gynecology | Admitting: Obstetrics & Gynecology

## 2016-06-09 ENCOUNTER — Encounter (HOSPITAL_COMMUNITY): Payer: Self-pay

## 2016-06-09 DIAGNOSIS — B9689 Other specified bacterial agents as the cause of diseases classified elsewhere: Secondary | ICD-10-CM | POA: Diagnosis not present

## 2016-06-09 DIAGNOSIS — Z3A01 Less than 8 weeks gestation of pregnancy: Secondary | ICD-10-CM | POA: Insufficient documentation

## 2016-06-09 DIAGNOSIS — A5901 Trichomonal vulvovaginitis: Secondary | ICD-10-CM | POA: Insufficient documentation

## 2016-06-09 DIAGNOSIS — N760 Acute vaginitis: Secondary | ICD-10-CM

## 2016-06-09 DIAGNOSIS — O23591 Infection of other part of genital tract in pregnancy, first trimester: Secondary | ICD-10-CM | POA: Diagnosis not present

## 2016-06-09 DIAGNOSIS — O98311 Other infections with a predominantly sexual mode of transmission complicating pregnancy, first trimester: Secondary | ICD-10-CM | POA: Diagnosis not present

## 2016-06-09 DIAGNOSIS — N898 Other specified noninflammatory disorders of vagina: Secondary | ICD-10-CM | POA: Diagnosis present

## 2016-06-09 LAB — URINE MICROSCOPIC-ADD ON

## 2016-06-09 LAB — URINALYSIS, ROUTINE W REFLEX MICROSCOPIC
Bilirubin Urine: NEGATIVE
Glucose, UA: NEGATIVE mg/dL
Ketones, ur: NEGATIVE mg/dL
Nitrite: NEGATIVE
Protein, ur: NEGATIVE mg/dL
Specific Gravity, Urine: >1.03 — ABNORMAL HIGH (ref 1.005–1.030)
pH: 6 (ref 5.0–8.0)

## 2016-06-09 LAB — WET PREP, GENITAL
Sperm: NONE SEEN
Yeast Wet Prep HPF POC: NONE SEEN

## 2016-06-09 MED ORDER — GLYCOPYRROLATE 1 MG PO TABS: 1.0000 mg | ORAL_TABLET | Freq: Two times a day (BID) | ORAL | 1 refills | Status: DC

## 2016-06-09 MED ORDER — ACIDOPHILUS SUPER PROBIOTIC PO CAPS: 1.0000 | ORAL_CAPSULE | Freq: Every day | ORAL | 3 refills | Status: DC

## 2016-06-09 MED ORDER — METRONIDAZOLE 500 MG PO TABS
2000.0000 mg | ORAL_TABLET | Freq: Once | ORAL | Status: AC
  Administered 2016-06-09: 2000 mg via ORAL
  Filled 2016-06-09: qty 4

## 2016-06-09 MED ORDER — PROMETHAZINE HCL 25 MG PO TABS: 25.0000 mg | ORAL_TABLET | Freq: Four times a day (QID) | ORAL | 2 refills | Status: DC | PRN

## 2016-06-09 MED ORDER — ONDANSETRON HCL 4 MG PO TABS
4.0000 mg | ORAL_TABLET | Freq: Once | ORAL | Status: AC
  Administered 2016-06-09: 4 mg via ORAL
  Filled 2016-06-09: qty 1

## 2016-06-09 MED ORDER — ONDANSETRON HCL 4 MG PO TABS
4.0000 mg | ORAL_TABLET | Freq: Once | ORAL | Status: AC
Start: 2016-06-09 — End: 2016-06-09
  Administered 2016-06-09: 4 mg via ORAL
  Filled 2016-06-09: qty 1

## 2016-06-09 NOTE — Discharge Instructions (Signed)
Bacterial Vaginosis Bacterial vaginosis is a vaginal infection that occurs when the normal balance of bacteria in the vagina is disrupted. It results from an overgrowth of certain bacteria. This is the most common vaginal infection in women of childbearing age. Treatment is important to prevent complications, especially in pregnant women, as it can cause a premature delivery. CAUSES  Bacterial vaginosis is caused by an increase in harmful bacteria that are normally present in smaller amounts in the vagina. Several different kinds of bacteria can cause bacterial vaginosis. However, the reason that the condition develops is not fully understood. RISK FACTORS Certain activities or behaviors can put you at an increased risk of developing bacterial vaginosis, including:  Having a new sex partner or multiple sex partners.  Douching.  Using an intrauterine device (IUD) for contraception. Women do not get bacterial vaginosis from toilet seats, bedding, swimming pools, or contact with objects around them. SIGNS AND SYMPTOMS  Some women with bacterial vaginosis have no signs or symptoms. Common symptoms include:  Grey vaginal discharge.  A fishlike odor with discharge, especially after sexual intercourse.  Itching or burning of the vagina and vulva.  Burning or pain with urination. DIAGNOSIS  Your health care provider will take a medical history and examine the vagina for signs of bacterial vaginosis. A sample of vaginal fluid may be taken. Your health care provider will look at this sample under a microscope to check for bacteria and abnormal cells. A vaginal pH test may also be done.  TREATMENT  Bacterial vaginosis may be treated with antibiotic medicines. These may be given in the form of a pill or a vaginal cream. A second round of antibiotics may be prescribed if the condition comes back after treatment. Because bacterial vaginosis increases your risk for sexually transmitted diseases, getting  treated can help reduce your risk for chlamydia, gonorrhea, HIV, and herpes. HOME CARE INSTRUCTIONS   Only take over-the-counter or prescription medicines as directed by your health care provider.  If antibiotic medicine was prescribed, take it as directed. Make sure you finish it even if you start to feel better.  Tell all sexual partners that you have a vaginal infection. They should see their health care provider and be treated if they have problems, such as a mild rash or itching.  During treatment, it is important that you follow these instructions:  Avoid sexual activity or use condoms correctly.  Do not douche.  Avoid alcohol as directed by your health care provider.  Avoid breastfeeding as directed by your health care provider. SEEK MEDICAL CARE IF:   Your symptoms are not improving after 3 days of treatment.  You have increased discharge or pain.  You have a fever. MAKE SURE YOU:   Understand these instructions.  Will watch your condition.  Will get help right away if you are not doing well or get worse. FOR MORE INFORMATION  Centers for Disease Control and Prevention, Division of STD Prevention: [employer].co.zawww.cdc.gov/std American Sexual Health Association (ASHA): www.ashastd.org    This information is not intended to replace advice given to you by your health care provider. Make sure you discuss any questions you have with your health care provider.   Document Released: 07/29/2005 Document Revised: 08/19/2014 Document Reviewed: 03/10/2013 Elsevier Interactive Patient Education 2016 [employer] Trichomoniasis Trichomoniasis is an infection caused by an organism called Trichomonas. The infection can affect both women and men. In women, the outer female genitalia and the vagina are affected. In men, the penis is mainly affected,  but the prostate and other reproductive organs can also be involved. Trichomoniasis is a sexually transmitted infection (STI) and is most often  passed to another person through sexual contact.  RISK FACTORS  Having unprotected sexual intercourse.  Having sexual intercourse with an infected partner. SIGNS AND SYMPTOMS  Symptoms of trichomoniasis in women include:  Abnormal gray-green frothy vaginal discharge.  Itching and irritation of the vagina.  Itching and irritation of the area outside the vagina. Symptoms of trichomoniasis in men include:   Penile discharge with or without pain.  Pain during urination. This results from inflammation of the urethra. DIAGNOSIS  Trichomoniasis may be found during a Pap test or physical exam. Your health care provider may use one of the following methods to help diagnose this infection:  Testing the pH of the vagina with a test tape.  Using a vaginal swab test that checks for the Trichomonas organism. A test is available that provides results within a few minutes.  Examining a urine sample.  Testing vaginal secretions. Your health care provider may test you for other STIs, including HIV. TREATMENT   You may be given medicine to fight the infection. Women should inform their health care provider if they could be or are pregnant. Some medicines used to treat the infection should not be taken during pregnancy.  Your health care provider may recommend over-the-counter medicines or creams to decrease itching or irritation.  Your sexual partner will need to be treated if infected.  Your health care provider may test you for infection again 3 months after treatment. HOME CARE INSTRUCTIONS   Take medicines only as directed by your health care provider.  Take over-the-counter medicine for itching or irritation as directed by your health care provider.  Do not have sexual intercourse while you have the infection.  Women should not douche or wear tampons while they have the infection.  Discuss your infection with your partner. Your partner may have gotten the infection from you, or you  may have gotten it from your partner.  Have your sex partner get examined and treated if necessary.  Practice safe, informed, and protected sex.  See your health care provider for other STI testing. SEEK MEDICAL CARE IF:   You still have symptoms after you finish your medicine.  You develop abdominal pain.  You have pain when you urinate.  You have bleeding after sexual intercourse.  You develop a rash.  Your medicine makes you sick or makes you throw up (vomit). MAKE SURE YOU:  Understand these instructions.  Will watch your condition.  Will get help right away if you are not doing well or get worse.   This information is not intended to replace advice given to you by your health care provider. Make sure you discuss any questions you have with your health care provider.   Document Released: 01/22/2001 Document Revised: 08/19/2014 Document Reviewed: 05/10/2013 Elsevier Interactive Patient Education 2016 [employer]. Expedited Partner Therapy:  Information Sheet for Patients and Partners               You have been offered expedited partner therapy (EPT). This information sheet contains important information and warnings you need to be aware of, so please read it carefully.   Expedited Partner Therapy (EPT) is the clinical practice of treating the sexual partners of persons who receive chlamydia, gonorrhea, or trichomoniasis diagnoses by providing medications or prescriptions to the patient. Patients then provide partners with these therapies without the health-care provider having examined  the partner. In other words, EPT is a convenient, fast and private way for patients to help their sexual partners get treated.   Chlamydia and gonorrhea are bacterial infections you get from having sex with a person who is already infected. Trichomoniasis (or trich) is a very common sexually transmitted infection (STI) that is caused by infection with a protozoan parasite called  Trichomonas vaginalis.  Many people with these infections dont know it because they feel fine, but without treatment these infections can cause serious health problems, such as pelvic inflammatory disease, ectopic pregnancy, infertility and increased risk of HIV.   It is important to get treated as soon as possible to protect your health, to avoid spreading these infections to others, and to prevent yourself from becoming re-infected. The good news is these infections can be easily cured with proper antibiotic medicine. The best way to take care of your self is to see a doctor or go to your local health department. If you are not able to see a doctor or other medical provider, you should take EPT.    Recommended Medication: EPT for Chlamydia:  Azithromycin (Zithromax) 1 gram orally in a single dose EPT for Gonorrhea:  Cefixime (Suprax) 400 milligrams orally in a single dose PLUS azithromycin (Zithromax) 1 gram orally in a single dose EPT for Trichomoniasis:  Metronidazole (Flagyl) 2 grams orally in a single dose   These medicines are very safe. However, you should not take them if you have ever had an allergic reaction (like a rash) to any of these medicines: azithromycin (Zithromax), erythromycin, clarithromycin (Biaxin), metronidazole (Flagyl), tinidazole (Tindimax). If you are uncertain about whether you have an allergy, call your medical provider or pharmacist before taking this medicine. If you have a serious, long-term illness like kidney, liver or heart disease, colitis or stomach problems, or you are currently taking other prescription medication, talk to your provider before taking this medication.   Women: If you have lower belly pain, pain during sex, vomiting, or a fever, do not take this medicine. Instead, you should see a medical provider to be certain you do not have pelvic inflammatory disease (PID). PID can be serious and lead to infertility, pregnancy problems or chronic pelvic pain.     Pregnant Women: It is very important for you to see a doctor to get pregnancy services and pre-natal care. These antibiotics for EPT are safe for pregnant women, but you still need to see a medical provider as soon as possible. It is also important to note that Doxycycline is an alternative therapy for chlamydia, but it should not be taken by someone who is pregnant.   Men: If you have pain or swelling in the testicles or a fever, do not take this medicine and see a medical provider.     Men who have sex with men (MSM): MSM in [place] continue to experience high rates of syphilis and HIV. Many MSM with gonorrhea or chlamydia could also have syphilis and/or HIV and not know it. If you are a man who has sex with other men, it is very important that you see a medical provider and are tested for HIV and syphilis. EPT is not recommended for gonorrhea for MSM.  Recommended treatment for gonorrhea for MSM is Rocephin (shot) AND azithromycin due to decreased cure rate.  Please see your medical provider if this is the case.    Along with this information sheet is a prescription for the medicine. If you receive a prescription  it will be in your name and will indicate your date of birth, or it will be in the name of Expedited Partner Therapy.   In either case, you can have the prescription filled at a pharmacy. You will be responsible for the cost of the medicine, unless you have prescription drug coverage. In that case, you could provide your name so the pharmacy could bill your health plan.   Take the medication as directed. Some people will have a mild, upset stomach, which does not last long. AVOID alcohol 24 hours after taking metronidazole (Flagyl) to reduce the possibility of a disulfiram-like reaction (severe vomiting and abdominal pain).  After taking the medicine, do not have sex for 7 days. Do not share this medicine or give it to anyone else. It is important to tell everyone you have had sex  with in the last 60 days that they need to go and get tested for sexually transmitted infections.   Ways to prevent these and other sexually transmitted infections (STIs):    Abstain from sex. This is the only sure way to avoid getting an STI.   Use barrier methods, such as condoms, consistently and correctly.   Limit the number of sexual partners.   Have regular physical exams, including testing for STIs.   For more information about EPT or other issues pertaining to an STI, please contact your medical provider or the [facility] at [phone] or [email or web address]

## 2016-06-09 NOTE — MAU Provider Note (Signed)
Chief Complaint:  Vaginal Discharge   First Provider Initiated Contact with Patient 06/09/16 1307     HPI: Shelly Gibbs is a 30 y.o. [id number] who presents to maternity admissions reporting vaginal discharge with odor.  Has a history of recurrent BV and a recent history of Trichomonas with reinfection soon after treatment.  .She reports no vaginal bleeding, vaginal itching/burning, urinary symptoms, h/a, dizziness, n/v, or fever/chills.    Vaginal Discharge  The patient's primary symptoms include a genital odor and vaginal discharge. The patient's pertinent negatives include no genital itching, genital rash or vaginal bleeding. This is a recurrent problem. The current episode started in the past 7 days. The problem occurs constantly. The problem has been unchanged. The patient is experiencing no pain. She is pregnant. Associated symptoms include nausea. Pertinent negatives include no abdominal pain, chills, constipation, diarrhea, dysuria or fever. The vaginal discharge was white and malodorous. There has been no bleeding. She has not been passing clots. She has not been passing tissue. Nothing aggravates the symptoms. She has tried nothing for the symptoms. She is sexually active. It is possible that her partner has an STD. She uses nothing for contraception.   RN Note: Patient presents with vaginal discharge with an odor and nausea.  Pregnant however has not been determined how far along yet.   Passed a blood clot on the 23rd of October.  She was treated for Trich, I asked her partner if he was treated and he says he was however they had unprotected sex a day or two after  Past Medical History: Past Medical History:  Diagnosis Date  . Asthma    inhaler last used "long time ago"  . BV (bacterial vaginosis)   . Chlamydia   . Gonorrhea 08-03-2012  . Headache(784.0)   . UTI (lower urinary tract infection)   . Yeast infection     Past obstetric history: OB History  Gravida Para Term  Preterm AB Living  8 2 2  0 5 2  SAB TAB Ectopic Multiple Live Births  1 4 0 0 1    # Outcome Date GA Lbr Len/2nd Weight Sex Delivery Anes PTL Lv  8 Current           7 Term 12/20/13 5784w5d 07:42 / 00:11 5 lb 13.1 oz (2.64 kg) F Vag-Spont EPI, Local  LIV     Birth Comments: none  6 TAB           5 TAB           4 TAB           3 SAB           2 TAB  2078w0d         1 Term      Vag-Spont         Past Surgical History: Past Surgical History:  Procedure Laterality Date  . DILATION AND CURETTAGE OF UTERUS    . INDUCED ABORTION     x 2    Family History: Family History  Problem Relation Age of Onset  . Diabetes Maternal Aunt   . Anesthesia problems Neg Hx   . Other Neg Hx   . Hearing loss Neg Hx     Social History: Social History  Substance Use Topics  . Smoking status: Never Smoker  . Smokeless tobacco: Never Used  . Alcohol use Yes     Comment: occasional alcohol    Allergies:  Allergies  Allergen Reactions  .  Latex Itching    Burning   . Tylenol [Acetaminophen] Hives    Patient states has taken Percocet in past and had no issues/no hives. Hives only when taken actual Brand name Tylenol.    Meds:  No prescriptions prior to admission.    I have reviewed patient's Past Medical Hx, Surgical Hx, Family Hx, Social Hx, medications and allergies.  ROS:  Review of Systems  Constitutional: Negative for chills and fever.  Gastrointestinal: Positive for nausea. Negative for abdominal pain, constipation and diarrhea.  Genitourinary: Positive for vaginal discharge. Negative for dysuria.   Other systems negative     Physical Exam  Patient Vitals for the past 24 hrs:  BP Temp Temp src Pulse Resp Height Weight  06/09/16 1243 133/82 98.3 F (36.8 C) Oral 96 18 - -  06/09/16 1241 - - - - - 5\' 2"  (1.575 m) 179 lb (81.2 kg)   Constitutional: Well-developed, well-nourished female in no acute distress.  Cardiovascular: normal rate and rhythm, no ectopy audible, S1 &  S2 heard, no murmur Respiratory: normal effort, no distress. Lungs CTAB with no wheezes or crackles GI: Abd soft, non-tender.  Nondistended.  No rebound, No guarding.  Bowel Sounds audible  MS: Extremities nontender, no edema, normal ROM Neurologic: Alert and oriented x 4.   Grossly nonfocal. GU: Neg CVAT. Skin:  Warm and Dry Psych:  Affect appropriate.  PELVIC EXAM: Cervix pink, visually closed, without lesion, scant white creamy discharge, vaginal walls and external genitalia normal   Labs: Results for orders placed or performed during the hospital encounter of 06/09/16 (from the past 24 hour(s))  Urinalysis, Routine w reflex microscopic ([facility])     Status: Abnormal   Collection Time: 06/09/16 12:34 PM  Result Value Ref Range   Color, Urine YELLOW YELLOW   APPearance CLEAR CLEAR   Specific Gravity, Urine >1.030 (H) 1.005 - 1.030   pH 6.0 5.0 - 8.0   Glucose, UA NEGATIVE NEGATIVE mg/dL   Hgb urine dipstick TRACE (A) NEGATIVE   Bilirubin Urine NEGATIVE NEGATIVE   Ketones, ur NEGATIVE NEGATIVE mg/dL   Protein, ur NEGATIVE NEGATIVE mg/dL   Nitrite NEGATIVE NEGATIVE   Leukocytes, UA SMALL (A) NEGATIVE  Urine microscopic-add on     Status: Abnormal   Collection Time: 06/09/16 12:34 PM  Result Value Ref Range   Squamous Epithelial / LPF 0-5 (A) NONE SEEN   WBC, UA 6-30 0 - 5 WBC/hpf   RBC / HPF 6-30 0 - 5 RBC/hpf   Bacteria, UA MANY (A) NONE SEEN  Wet prep, genital     Status: Abnormal   Collection Time: 06/09/16  1:15 PM  Result Value Ref Range   Yeast Wet Prep HPF POC NONE SEEN NONE SEEN   Trich, Wet Prep PRESENT (A) NONE SEEN   Clue Cells Wet Prep HPF POC PRESENT (A) NONE SEEN   WBC, Wet Prep HPF POC FEW (A) NONE SEEN   Sperm NONE SEEN     Imaging:  [place] Ob Comp Less 14 Wks  Result Date: 05/28/2016 CLINICAL DATA:  Vaginal bleeding and pain.  Beta HCG 559 EXAM: OBSTETRIC <14 WK US AND TRANSVAGINAL OB US TECHNIQUE: Both transabdominal and transvaginal ultrasound  examinations were performed for complete evaluation of the gestation as well as the maternal uterus, adnexal regions, and pelvic cul-de-sac. Transvaginal technique was performed to assess early pregnancy. COMPARISON:  Ultrasound 05/27/2013 FINDINGS: Intrauterine gestational sac: Not present Yolk sac:  Not present Embryo:  Not present Cardiac  Activity: Not present Heart Rate: Not present  Bpm MSD:   mm    w     d CRL:    mm    w    d                  [place] EDC: Subchorionic hemorrhage:  None visualized. Maternal uterus/adnexae: Normal ovaries. Endometrium is homogeneous measuring 10 mm in diameter. Small amount of free fluid. IMPRESSION: No intrauterine pregnancy identified. No adnexal mass. This could represent early pregnancy, spontaneous abortion, or ectopic pregnancy. Close follow-up recommended. Electronically Signed   By: Marlan Palau M.D.   On: 05/28/2016 13:12   US Ob Transvaginal  Result Date: 05/28/2016 CLINICAL DATA:  Vaginal bleeding and pain.  Beta HCG 559 EXAM: OBSTETRIC <14 WK [place] AND TRANSVAGINAL OB US TECHNIQUE: Both transabdominal and transvaginal ultrasound examinations were performed for complete evaluation of the gestation as well as the maternal uterus, adnexal regions, and pelvic cul-de-sac. Transvaginal technique was performed to assess early pregnancy. COMPARISON:  Ultrasound 05/27/2013 FINDINGS: Intrauterine gestational sac: Not present Yolk sac:  Not present Embryo:  Not present Cardiac Activity: Not present Heart Rate: Not present  Bpm MSD:   mm    w     d CRL:    mm    w    d                  [place] EDC: Subchorionic hemorrhage:  None visualized. Maternal uterus/adnexae: Normal ovaries. Endometrium is homogeneous measuring 10 mm in diameter. Small amount of free fluid. IMPRESSION: No intrauterine pregnancy identified. No adnexal mass. This could represent early pregnancy, spontaneous abortion, or ectopic pregnancy. Close follow-up recommended. Electronically Signed   By: Marlan Palau  M.D.   On: 05/28/2016 13:12    MAU Course/MDM: I have ordered labs as follows: GC, Chlamydia, Wet prep, UA Imaging ordered: none Results reviewed.   Treatments in MAU included Zofran and Flagyl.   Pt stable at time of discharge.  Assessment: Pregnancy at [redacted]w[redacted]d by LMP Trichomonal vaginitis, recurrent BV, recurrent  Plan: Discharge home Recommend Abstinence for 2 weeks post both treatments Rx sent for Probiotics for prevention of BV Patient treated here Partner Rx for Flagyl given for partner Mr Deneen Harts Suggested HIV since she had last one in April but she refused testing Has appt Nov 2 for [place] to confirm IUP   Encouraged to return here or to other Urgent Care/ED if she develops worsening of symptoms, increase in pain, fever, or other concerning symptoms.   Wynelle Bourgeois CNM, MSN Certified Nurse-Midwife 06/09/2016 1:25 PM

## 2016-06-09 NOTE — MAU Note (Addendum)
Patient presents with vaginal discharge with an odor and nausea.  Pregnant however has not been determined how far along yet.   Passed a blood clot on the 23rd of October.  She was treated for Trich, I asked her partner if he was treated and he says he was however they had unprotected sex a day or two after.

## 2016-06-09 NOTE — Telephone Encounter (Signed)
Received call that pt states Rxes were not at pharmacy Records show they are  I called [facility] pharmacy, they are now closed Unable to leave message

## 2016-06-10 LAB — GC/CHLAMYDIA PROBE AMP (~~LOC~~) NOT AT ARMC
Chlamydia: NEGATIVE
Neisseria Gonorrhea: NEGATIVE

## 2016-06-13 ENCOUNTER — Ambulatory Visit (HOSPITAL_COMMUNITY): Admission: RE | Admit: 2016-06-13 | Payer: Medicaid Other | Source: Ambulatory Visit

## 2016-06-24 ENCOUNTER — Encounter (HOSPITAL_BASED_OUTPATIENT_CLINIC_OR_DEPARTMENT_OTHER): Payer: Self-pay | Admitting: *Deleted

## 2016-06-24 ENCOUNTER — Emergency Department (HOSPITAL_BASED_OUTPATIENT_CLINIC_OR_DEPARTMENT_OTHER)
Admission: EM | Admit: 2016-06-24 | Discharge: 2016-06-24 | Disposition: A | Discharge status: Home / Self Care | Payer: Medicaid Other | Attending: Dermatology | Admitting: Dermatology

## 2016-06-24 DIAGNOSIS — J45909 Unspecified asthma, uncomplicated: Secondary | ICD-10-CM | POA: Insufficient documentation

## 2016-06-24 DIAGNOSIS — Z5321 Procedure and treatment not carried out due to patient leaving prior to being seen by health care provider: Secondary | ICD-10-CM | POA: Insufficient documentation

## 2016-06-24 DIAGNOSIS — Z202 Contact with and (suspected) exposure to infections with a predominantly sexual mode of transmission: Secondary | ICD-10-CM | POA: Diagnosis not present

## 2016-06-24 NOTE — ED Triage Notes (Signed)
Pt here for STD recheck and 2nd round of PCN for syphilis  

## 2016-07-01 ENCOUNTER — Ambulatory Visit: Payer: Medicaid Other | Admitting: Family Medicine

## 2016-07-01 DIAGNOSIS — Z3200 Encounter for pregnancy test, result unknown: Secondary | ICD-10-CM

## 2016-07-01 LAB — POCT PREGNANCY, URINE: Preg Test, Ur: POSITIVE — AB

## 2016-07-01 NOTE — Progress Notes (Signed)
Pt here today for Depo Provera after obtaining a pregnancy test.  Pregnancy test resulted positive.  Looking in pt's chart pt was seen in our office for STAT beta on 1019/17 and was followed up with a US which confirmed pregnancy.  After speaking with the pt, pt stated that she had an abortion on 06/14/16 in which she been trying to contact them [facility] and she did not have a successful time in reaching them.  Pt stated that since she did not receive a return call back there she decided to make an app[facility].  I asked pt when was the last time she has intercourse.  Pt stated that she had unprotected intercourse right after her abortion and within the week.  Notified Dr. Vergie LivingPickens, was advised for pt to have a beta drawn today, not to give Depo Provera today, and we will call her with her follow up.  Informed pt of provider's recommendation.  Pt agreed with no further questions.

## 2016-07-02 LAB — HCG, QUANTITATIVE, PREGNANCY: hCG, Beta Chain, Quant, S: 17.6 m[IU]/mL — ABNORMAL HIGH

## 2016-07-03 ENCOUNTER — Telehealth: Payer: Self-pay | Admitting: *Deleted

## 2016-07-03 NOTE — Telephone Encounter (Signed)
[facility] called and call transferred to nurse. She wants bhcg result. I explained to her it was still elevated at 17 and we need to redraw it. Per discussion with Dr. Genevie AnnSchenk plan is repeat bhcg about 10 days after last unprotected intercourse and then if negative may start depo-provera 14 days after last unprotected intercourse.  Discussed with Brayton ElBritney and she states last unprotected intercourse was 07/01/16. We discussed she must abstain from intercourse or use condoms until after she gets the depo-provera She agreed to 07/11/16 0900 for bhcg and 07/15/16 0900 for depoprovera if bhcg negative and no further unprotected intercourse.

## 2016-07-03 NOTE — Telephone Encounter (Signed)
Pt left message yesterday requesting her lab results.

## 2016-07-11 ENCOUNTER — Other Ambulatory Visit: Payer: Medicaid Other

## 2016-07-11 DIAGNOSIS — O039 Complete or unspecified spontaneous abortion without complication: Secondary | ICD-10-CM

## 2016-07-12 LAB — HCG, QUANTITATIVE, PREGNANCY: hCG, Beta Chain, Quant, S: 4.4 m[IU]/mL

## 2016-07-15 ENCOUNTER — Ambulatory Visit (INDEPENDENT_AMBULATORY_CARE_PROVIDER_SITE_OTHER): Payer: Medicaid Other | Admitting: General Practice

## 2016-07-15 VITALS — BP 141/80 | HR 68 | Ht 62.0 in | Wt 174.0 lb

## 2016-07-15 DIAGNOSIS — Z3042 Encounter for surveillance of injectable contraceptive: Secondary | ICD-10-CM | POA: Diagnosis not present

## 2016-07-15 DIAGNOSIS — Z30013 Encounter for initial prescription of injectable contraceptive: Secondary | ICD-10-CM | POA: Diagnosis not present

## 2016-07-15 MED ORDER — MEDROXYPROGESTERONE ACETATE 150 MG/ML IM SUSP
150.0000 mg | Freq: Once | INTRAMUSCULAR | Status: AC
  Administered 2016-07-15: 150 mg via INTRAMUSCULAR

## 2016-07-15 NOTE — Progress Notes (Signed)
Patient here for depo today due to negative bhcg levels. Patient informed & depo given. Advised back up protection for 2 weeks.

## 2016-07-23 ENCOUNTER — Inpatient Hospital Stay (HOSPITAL_COMMUNITY)
Admission: AD | Admit: 2016-07-23 | Discharge: 2016-07-23 | Disposition: A | Discharge status: Home / Self Care | Payer: Medicaid Other | Source: Ambulatory Visit | Attending: Obstetrics and Gynecology | Admitting: Obstetrics and Gynecology

## 2016-07-23 ENCOUNTER — Encounter (HOSPITAL_COMMUNITY): Payer: Self-pay | Admitting: *Deleted

## 2016-07-23 DIAGNOSIS — Z885 Allergy status to narcotic agent status: Secondary | ICD-10-CM | POA: Insufficient documentation

## 2016-07-23 DIAGNOSIS — Z3202 Encounter for pregnancy test, result negative: Secondary | ICD-10-CM | POA: Diagnosis not present

## 2016-07-23 DIAGNOSIS — N898 Other specified noninflammatory disorders of vagina: Secondary | ICD-10-CM | POA: Diagnosis not present

## 2016-07-23 HISTORY — DX: Trichomoniasis, unspecified: A59.9

## 2016-07-23 LAB — URINALYSIS, ROUTINE W REFLEX MICROSCOPIC
Bacteria, UA: NONE SEEN
Bilirubin Urine: NEGATIVE
Glucose, UA: NEGATIVE mg/dL
Hgb urine dipstick: NEGATIVE
Ketones, ur: 20 mg/dL — AB
Leukocytes, UA: NEGATIVE
Nitrite: NEGATIVE
Protein, ur: 30 mg/dL — AB
Specific Gravity, Urine: 1.028 (ref 1.005–1.030)
pH: 5 (ref 5.0–8.0)

## 2016-07-23 LAB — WET PREP, GENITAL
Clue Cells Wet Prep HPF POC: NONE SEEN
Sperm: NONE SEEN
Trich, Wet Prep: NONE SEEN
Yeast Wet Prep HPF POC: NONE SEEN

## 2016-07-23 LAB — POCT PREGNANCY, URINE: Preg Test, Ur: NEGATIVE

## 2016-07-23 NOTE — MAU Note (Signed)
Vaginal irritation and itching. Started after was last treated for trich.

## 2016-07-23 NOTE — MAU Provider Note (Signed)
History     CSN: [id number]  Arrival date and time: 07/23/16 1414   First Provider Initiated Contact with Patient 07/23/16 1511      Chief Complaint  Patient presents with  . Vaginal Itching  . Vaginal Discharge   Non-pregnant female here with vaginal discharge. She reports malodorous brown discharge last week then became white and clumpy and had vaginal itching. She denies new partner in 2 years. She was treated 2 mos ago for trich and reports her partner was treated as well. She declines full STD screen today.      Pertinent Gynecological History: Menses: none Contraception: Depo-Provera injections Sexually transmitted diseases: past history: trich, CT, GC  Past Medical History:  Diagnosis Date  . Asthma    inhaler last used "long time ago"  . BV (bacterial vaginosis)   . Chlamydia   . Gonorrhea 08-03-2012  . Headache(784.0)   . Trichomonas infection   . UTI (lower urinary tract infection)   . Yeast infection     Past Surgical History:  Procedure Laterality Date  . DILATION AND CURETTAGE OF UTERUS    . INDUCED ABORTION     x 2    Family History  Problem Relation Age of Onset  . Diabetes Maternal Aunt   . Anesthesia problems Neg Hx   . Other Neg Hx   . Hearing loss Neg Hx     Social History  Substance Use Topics  . Smoking status: Never Smoker  . Smokeless tobacco: Never Used  . Alcohol use Yes     Comment: occasional alcohol    Allergies:  Allergies  Allergen Reactions  . Latex Itching    Burning   . Percocet [Oxycodone-Acetaminophen] Hives  . Tylenol [Acetaminophen] Hives    Prescriptions Prior to Admission  Medication Sig Dispense Refill Last Dose  . glycopyrrolate (ROBINUL) 1 MG tablet Take 1 tablet (1 mg total) by mouth 2 (two) times daily. (Patient not taking: Reported on 07/23/2016) 30 tablet 1 Not Taking at Unknown time  . Probiotic Product (ACIDOPHILUS SUPER PROBIOTIC) CAPS Take 1 capsule by mouth daily. (Patient not taking: Reported  on 07/23/2016) 30 capsule 3 Not Taking at Unknown time  . promethazine (PHENERGAN) 25 MG tablet Take 1 tablet (25 mg total) by mouth every 6 (six) hours as needed for nausea or vomiting. (Patient not taking: Reported on 07/23/2016) 30 tablet 2 Not Taking at Unknown time    Review of Systems  Constitutional: Negative.   Gastrointestinal: Negative.   Genitourinary: Negative.    Physical Exam   Blood pressure 125/82, pulse 66, temperature 98.4 F (36.9 C), temperature source Oral, resp. rate 18, weight 78.1 kg (172 lb 3.2 oz), unknown if currently breastfeeding.  Physical Exam  Constitutional: She is oriented to person, place, and time. She appears well-developed and well-nourished. No distress.  HENT:  Head: Normocephalic and atraumatic.  Neck: Normal range of motion. Neck supple.  Cardiovascular: Normal rate.   Respiratory: Effort normal.  Musculoskeletal: Normal range of motion.  Neurological: She is alert and oriented to person, place, and time.  Skin: Skin is warm and dry.  Psychiatric: She has a normal mood and affect.   Results for orders placed or performed during the hospital encounter of 07/23/16 (from the past 24 hour(s))  Urinalysis, Routine w reflex microscopic     Status: Abnormal   Collection Time: 07/23/16  2:45 PM  Result Value Ref Range   Color, Urine YELLOW YELLOW   APPearance HAZY (A) CLEAR  Specific Gravity, Urine 1.028 1.005 - 1.030   pH 5.0 5.0 - 8.0   Glucose, UA NEGATIVE NEGATIVE mg/dL   Hgb urine dipstick NEGATIVE NEGATIVE   Bilirubin Urine NEGATIVE NEGATIVE   Ketones, ur 20 (A) NEGATIVE mg/dL   Protein, ur 30 (A) NEGATIVE mg/dL   Nitrite NEGATIVE NEGATIVE   Leukocytes, UA NEGATIVE NEGATIVE   RBC / HPF 0-5 0 - 5 RBC/hpf   WBC, UA 6-30 0 - 5 WBC/hpf   Bacteria, UA NONE SEEN NONE SEEN   Squamous Epithelial / LPF 0-5 (A) NONE SEEN   Mucous PRESENT   Pregnancy, urine POC     Status: None   Collection Time: 07/23/16  2:51 PM  Result Value Ref Range    Preg Test, Ur NEGATIVE NEGATIVE  Wet prep, genital     Status: Abnormal   Collection Time: 07/23/16  3:29 PM  Result Value Ref Range   Yeast Wet Prep HPF POC NONE SEEN NONE SEEN   Trich, Wet Prep NONE SEEN NONE SEEN   Clue Cells Wet Prep HPF POC NONE SEEN NONE SEEN   WBC, Wet Prep HPF POC MANY (A) NONE SEEN   Sperm NONE SEEN     MAU Course  Procedures  MDM Labs ordered and reviewed. No evidence of acute pelvic process or vaginal infection today. GC/CT pending. Stable for discharge home.  Assessment and Plan   1. Vaginal discharge    Discharge home Avoid douching Tub soaks with baking soda prn Follow up in [facility] as needed Return to MAU for emergencies  Donette LarryMelanie Angelynn Lemus, CNM 07/23/2016, 3:19 PM

## 2016-07-23 NOTE — Discharge Instructions (Signed)

## 2016-07-24 LAB — GC/CHLAMYDIA PROBE AMP (~~LOC~~) NOT AT ARMC
Chlamydia: NEGATIVE
Neisseria Gonorrhea: NEGATIVE

## 2016-07-25 ENCOUNTER — Telehealth (HOSPITAL_COMMUNITY): Payer: Self-pay | Admitting: *Deleted

## 2016-09-11 ENCOUNTER — Ambulatory Visit: Payer: Medicaid Other | Admitting: *Deleted

## 2016-09-11 ENCOUNTER — Other Ambulatory Visit (HOSPITAL_COMMUNITY)
Admission: RE | Admit: 2016-09-11 | Discharge: 2016-09-11 | Disposition: A | Discharge status: Home / Self Care | Payer: Medicaid Other | Source: Ambulatory Visit | Attending: Obstetrics & Gynecology | Admitting: Obstetrics & Gynecology

## 2016-09-11 DIAGNOSIS — N898 Other specified noninflammatory disorders of vagina: Secondary | ICD-10-CM

## 2016-09-11 DIAGNOSIS — A5901 Trichomonal vulvovaginitis: Secondary | ICD-10-CM | POA: Diagnosis not present

## 2016-09-11 MED ORDER — METRONIDAZOLE 0.75 % VA GEL: 1.0000 | Freq: Every day | VAGINAL | 0 refills | Status: AC

## 2016-09-11 MED ORDER — FLUCONAZOLE 150 MG PO TABS: 150.0000 mg | ORAL_TABLET | Freq: Once | ORAL | 1 refills | Status: AC

## 2016-09-11 NOTE — Progress Notes (Signed)
Patient presents with c/o brown vaginal discharge with an odor. Also has itchy rash in groin area. Instructed patient on self swab, patient voiced understanding. Self swab complete. Spoke to Dr Debroah LoopArnold. Metrogel and diflucan prescribed. Instructed on meds, understanding voiced. Patient told we will call if anything shows on the wet prep. Otherwise, call if symptoms do not resolve.

## 2016-09-12 LAB — CERVICOVAGINAL ANCILLARY ONLY
Bacterial vaginitis: NEGATIVE
Candida vaginitis: NEGATIVE
Trichomonas: POSITIVE — AB

## 2016-09-16 ENCOUNTER — Encounter: Payer: Self-pay | Admitting: General Practice

## 2016-09-16 MED ORDER — METRONIDAZOLE 500 MG PO TABS: 2000.0000 mg | ORAL_TABLET | Freq: Once | ORAL | 0 refills | Status: AC

## 2016-09-16 NOTE — Addendum Note (Signed)
Addended by: Kathee DeltonHILLMAN, Zakry Caso L on: 09/16/2016 12:02 PM   Modules accepted: Orders

## 2016-09-17 ENCOUNTER — Telehealth: Payer: Self-pay | Admitting: *Deleted

## 2016-09-17 NOTE — Telephone Encounter (Signed)
Patient left voice mail, looking for test results. Please return call.

## 2016-09-17 NOTE — Telephone Encounter (Signed)
Returned call, notified patient of positive trich, antibiotics called to pharmacy. Advised her to notify partner so he can be treated as well. Understanding voiced.

## 2016-09-18 ENCOUNTER — Encounter: Payer: Self-pay | Admitting: General Practice

## 2016-09-30 ENCOUNTER — Ambulatory Visit: Payer: Medicaid Other

## 2016-10-14 ENCOUNTER — Ambulatory Visit: Payer: Medicaid Other | Admitting: Obstetrics & Gynecology

## 2016-10-26 ENCOUNTER — Emergency Department (HOSPITAL_BASED_OUTPATIENT_CLINIC_OR_DEPARTMENT_OTHER)
Admission: EM | Admit: 2016-10-26 | Discharge: 2016-10-26 | Disposition: A | Discharge status: Home / Self Care | Payer: Medicaid Other | Attending: Emergency Medicine | Admitting: Emergency Medicine

## 2016-10-26 ENCOUNTER — Encounter (HOSPITAL_BASED_OUTPATIENT_CLINIC_OR_DEPARTMENT_OTHER): Payer: Self-pay | Admitting: Emergency Medicine

## 2016-10-26 DIAGNOSIS — J45909 Unspecified asthma, uncomplicated: Secondary | ICD-10-CM | POA: Diagnosis not present

## 2016-10-26 DIAGNOSIS — L0291 Cutaneous abscess, unspecified: Secondary | ICD-10-CM

## 2016-10-26 DIAGNOSIS — L02411 Cutaneous abscess of right axilla: Secondary | ICD-10-CM | POA: Insufficient documentation

## 2016-10-26 MED ORDER — LIDOCAINE-EPINEPHRINE (PF) 2 %-1:200000 IJ SOLN
10.0000 mL | Freq: Once | INTRAMUSCULAR | Status: AC
  Administered 2016-10-26: 10 mL

## 2016-10-26 MED ORDER — IBUPROFEN 800 MG PO TABS
800.0000 mg | ORAL_TABLET | Freq: Once | ORAL | Status: AC
  Administered 2016-10-26: 800 mg via ORAL
  Filled 2016-10-26: qty 1

## 2016-10-26 MED ORDER — LIDOCAINE-EPINEPHRINE (PF) 2 %-1:200000 IJ SOLN
INTRAMUSCULAR | Status: AC
  Filled 2016-10-26: qty 20

## 2016-10-26 MED ORDER — MORPHINE SULFATE 15 MG PO TABS: 15.0000 mg | ORAL_TABLET | Freq: Four times a day (QID) | ORAL | 0 refills | Status: DC | PRN

## 2016-10-26 MED ORDER — SULFAMETHOXAZOLE-TRIMETHOPRIM 800-160 MG PO TABS: 1.0000 | ORAL_TABLET | Freq: Two times a day (BID) | ORAL | 0 refills | Status: AC

## 2016-10-26 NOTE — ED Provider Notes (Signed)
[facility] Provider Note   CSN: [id number] Arrival date & time: 10/26/16  [id number]     History   Chief Complaint No chief complaint on file.   HPI Shelly Gibbs is a 31 y.o. female.  The history is provided by the patient and medical records. No language interpreter was used.   Shelly Gibbs is a 31 y.o. female  who presents to the Emergency Department complaining of progressively worsening painful "boil" under her right arm 2 weeks. She has tried warm compresses with little improvement. She states she has had these under her right arm before, most recently 2 years ago. No fevers or chills. No drainage from the site.   Past Medical History:  Diagnosis Date  . Asthma    inhaler last used "long time ago"  . BV (bacterial vaginosis)   . Chlamydia   . Gonorrhea 08-03-2012  . Headache(784.0)   . Trichomonas infection   . UTI (lower urinary tract infection)   . Yeast infection     Patient Active Problem List   Diagnosis Date Noted  . Indication for care in labor or delivery 12/20/2013  . Pregnancy 12/07/2013  . Bacterial vaginosis 07/30/2012  . Impetigo 07/30/2012    Past Surgical History:  Procedure Laterality Date  . DILATION AND CURETTAGE OF UTERUS    . INDUCED ABORTION     x 2    OB History    Gravida Para Term Preterm AB Living   8 2 2  0 5 2   SAB TAB Ectopic Multiple Live Births   1 4 0 0 1       Home Medications    Prior to Admission medications   Not on File    Family History Family History  Problem Relation Age of Onset  . Diabetes Maternal Aunt   . Anesthesia problems Neg Hx   . Other Neg Hx   . Hearing loss Neg Hx     Social History Social History  Substance Use Topics  . Smoking status: Never Smoker  . Smokeless tobacco: Never Used  . Alcohol use Yes     Comment: occasional alcohol     Allergies   Latex; Percocet [oxycodone-acetaminophen]; and Tylenol [acetaminophen]   Review of Systems Review of Systems    Skin: Positive for wound.    10 Systems reviewed and are negative for acute change except as noted in the HPI.   Physical Exam Updated Vital Signs BP (!) 135/99 (BP Location: Left Arm)   Pulse 82   Temp 98.9 F (37.2 C) (Oral)   Resp 18   Ht 5\' 2"  (1.575 m)   Wt 78 kg   SpO2 98% Comment: SPO2 entered incorrectly at 86%  BMI 31.46 kg/m   Physical Exam  Constitutional: She appears well-developed and well-nourished. No distress.  HENT:  Head: Normocephalic and atraumatic.  Cardiovascular: Normal rate, regular rhythm and normal heart sounds.   No murmur heard. Pulmonary/Chest: Effort normal and breath sounds normal. No respiratory distress. She has no wheezes. She has no rales.  Musculoskeletal: Normal range of motion.  Neurological: She is alert.  Skin: Skin is warm and dry.  2x3 tender area of fluctuance under right axilla. No active drainage. No surrounding erythema.   Psychiatric: She has a normal mood and affect.  Nursing note and vitals reviewed.    ED Treatments / Results  Labs (all labs ordered are listed, but only abnormal results are displayed) Labs Reviewed - No data to  display  EKG  EKG Interpretation None       Radiology No results found.  Procedures Procedures (including critical care time)  EMERGENCY DEPARTMENT US SOFT TISSUE INTERPRETATION "Study: Limited Soft Tissue Ultrasound"  INDICATIONS: Soft tissue infection Multiple views of the body part were obtained in real-time with a multi-frequency linear probe  PERFORMED BY: Myself IMAGES ARCHIVED?: Yes SIDE:Right  BODY PART:Axilla INTERPRETATION:  One area with abscess noted. One area with doppler flow c/w lymph node.     INCISION AND DRAINAGE Performed by: Chase Picket Chizara Mena Consent: Verbal consent obtained. Risks and benefits: risks, benefits and alternatives were discussed Type: abscess Body area: Right axilla Anesthesia: local infiltration Incision was made with a  scalpel. Local anesthetic: lidocaine 2% with epinephrine Anesthetic total: 4 ml Complexity: complex Blunt dissection to break up loculations Drainage: purulent Drainage amount: moderate Packing material: 1/4 in iodoform gauze Patient tolerance: Patient tolerated the procedure well with no immediate complications.   Medications Ordered in ED Medications - No data to display   Initial Impression / Assessment and Plan / ED Course  I have reviewed the triage vital signs and the nursing notes.  Pertinent labs & imaging results that were available during my care of the patient were reviewed by me and considered in my medical decision making (see chart for details).    Patient presenting with abscess requiring incision and drainage. No evidence of surrounding erythema to suggest cellulitis. No crepitance to suggest necrotizing fasciitis. Ultrasound as above: two areas on exam, one with doppler c/w lymph node, the other with pocket c/w abscess. Incision and drainage performed per procedure note. Patient tolerated the procedure well. Patient was prescribed Bactrim. Wound care instructions discussed. Wound check in 2-3 days. Return to ER if concern for spread of infection, increasing pain, fevers, or other concerns. All questions answered.    Final Clinical Impressions(s) / ED Diagnoses   Final diagnoses:  None    New Prescriptions New Prescriptions   No medications on file     [facility] Masato Pettie, PA-C 10/26/16 1113    Vanetta Mulders, MD 10/28/16 [phone]

## 2016-10-26 NOTE — ED Triage Notes (Signed)
Pt c/o abcess under her right arm, first noticed 2 weeks ago with progressive worsening.  Pt has tried warm compresses with no relief.

## 2016-10-26 NOTE — ED Notes (Signed)
Asher MuirJamie, PA-C in room with pt now.

## 2016-10-26 NOTE — Discharge Instructions (Signed)
Please take all of your antibiotics until finished!   You may develop abdominal discomfort or diarrhea from the antibiotic. You may help offset this with probiotics which you can buy or get in yogurt.   Ibuprofen for mild to moderate pain. Pain medication only as needed for severe pain - This can make you very drowsy - please do not drink alcohol, operate heavy machinery or drive on this medication.   Follow up with your doctor, an urgent care, or return to ED in order to remove your packing in 48-72 hours. Return to the emergency department if you develop a fever, your abscess appears to become infected (growing surrounding redness and warmth), new or worsening symptoms develop, any additional concerns.   Abscess An abscess (boil or furuncle) is an infected area that contains a collection of pus.   SYMPTOMS Signs and symptoms of an abscess include pain, tenderness, redness, or hardness. You may feel a moveable soft area under your skin. An abscess can occur anywhere in the body.   TREATMENT  A surgical cut (incision) may be made over your abscess to drain the pus. Gauze may be packed into the space or a drain may be looped through the abscess cavity (pocket). This provides a drain that will allow the cavity to heal from the inside outwards. The abscess may be painful for a few days, but should feel much better if it was drained.  Your abscess, if seen early, may not have localized and may not have been drained. If not, another appointment may be required if it does not get better on its own or with medications.  HOME CARE INSTRUCTIONS  Keep the skin and clothes clean around your abscess.  If the abscess was drained, you will need to use gauze dressing to collect any draining pus. Dressings will typically need to be changed 3 or more times a day.  The infection may spread by skin contact with others. Avoid skin contact as much as possible.  Practice good hygiene. This includes regular hand  washing, cover any draining skin lesions, and do not share personal care items.  SEEK MEDICAL CARE IF:  You develop increased pain, swelling, redness, drainage, or bleeding in the wound site.  You develop signs of generalized infection including muscle aches, chills, fever, or a general ill feeling.  You have an oral temperature above 102 F (38.9 C).  MAKE SURE YOU:  Understand these instructions.  Will watch your condition.  Will get help right away if you are not doing well or get worse.  Document Released: 05/08/2005 Document Revised: 04/10/2011 Document Reviewed: 03/01/2008 [facility] Patient Information 2012 [place], [place].

## 2016-10-28 ENCOUNTER — Encounter (HOSPITAL_BASED_OUTPATIENT_CLINIC_OR_DEPARTMENT_OTHER): Payer: Self-pay | Admitting: *Deleted

## 2016-10-28 ENCOUNTER — Emergency Department (HOSPITAL_BASED_OUTPATIENT_CLINIC_OR_DEPARTMENT_OTHER)
Admission: EM | Admit: 2016-10-28 | Discharge: 2016-10-28 | Disposition: A | Discharge status: Home / Self Care | Payer: Medicaid Other | Attending: Emergency Medicine | Admitting: Emergency Medicine

## 2016-10-28 DIAGNOSIS — Z792 Long term (current) use of antibiotics: Secondary | ICD-10-CM | POA: Insufficient documentation

## 2016-10-28 DIAGNOSIS — Z91040 Latex allergy status: Secondary | ICD-10-CM | POA: Diagnosis not present

## 2016-10-28 DIAGNOSIS — Z09 Encounter for follow-up examination after completed treatment for conditions other than malignant neoplasm: Secondary | ICD-10-CM

## 2016-10-28 DIAGNOSIS — J45909 Unspecified asthma, uncomplicated: Secondary | ICD-10-CM | POA: Insufficient documentation

## 2016-10-28 DIAGNOSIS — L02411 Cutaneous abscess of right axilla: Secondary | ICD-10-CM | POA: Insufficient documentation

## 2016-10-28 DIAGNOSIS — Z5189 Encounter for other specified aftercare: Secondary | ICD-10-CM

## 2016-10-28 DIAGNOSIS — L0291 Cutaneous abscess, unspecified: Secondary | ICD-10-CM

## 2016-10-28 DIAGNOSIS — Z4801 Encounter for change or removal of surgical wound dressing: Secondary | ICD-10-CM | POA: Diagnosis present

## 2016-10-28 HISTORY — DX: Encounter for other specified aftercare: Z51.89

## 2016-10-28 MED ORDER — LIDOCAINE-EPINEPHRINE (PF) 2 %-1:200000 IJ SOLN
INTRAMUSCULAR | Status: AC
  Administered 2016-10-28: 10 mL
  Filled 2016-10-28: qty 20

## 2016-10-28 MED ORDER — MORPHINE SULFATE 15 MG PO TABS
15.0000 mg | ORAL_TABLET | ORAL | Status: DC | PRN
  Filled 2016-10-28: qty 1

## 2016-10-28 MED ORDER — LIDOCAINE-EPINEPHRINE (PF) 2 %-1:200000 IJ SOLN
10.0000 mL | Freq: Once | INTRAMUSCULAR | Status: AC
  Administered 2016-10-28: 10 mL

## 2016-10-28 MED ORDER — IBUPROFEN 800 MG PO TABS
800.0000 mg | ORAL_TABLET | Freq: Once | ORAL | Status: AC
  Administered 2016-10-28: 800 mg via ORAL
  Filled 2016-10-28: qty 1

## 2016-10-28 NOTE — ED Notes (Signed)
EDPa notified of pt's request for pain medication prior to procedure. Ice pack applied to axilla

## 2016-10-28 NOTE — ED Notes (Signed)
Pt made aware to return if symptoms worsen or if any life threatening symptoms occur.  Dressing applied to right axilla.

## 2016-10-28 NOTE — ED Notes (Signed)
ED PA at bedside

## 2016-10-28 NOTE — ED Triage Notes (Signed)
Right axilla abscess I&D on Saturday morning. Patient started antibiotics yesterday. Wick in place. Pt reports bloody, pus drainage. Here for wound check

## 2016-10-28 NOTE — ED Provider Notes (Signed)
[facility]   CSN: [id number] Arrival date & time: 10/28/16  0930     History   Chief Complaint Chief Complaint  Patient presents with  . Wound Check    HPI Shelly Gibbs is a 31 y.o. female presents for wound check after incision and drainage of 2 right axillary abscesses. Patient reports one of the abscesses appears to be improving. However the other abscess appears to be swelling, is more tender. Patient has been taking prescribed antibiotics but as not been doing hot compresses. Patient denies fevers.  HPI  Past Medical History:  Diagnosis Date  . Asthma    inhaler last used "long time ago"  . BV (bacterial vaginosis)   . Chlamydia   . Gonorrhea 08-03-2012  . Headache(784.0)   . Trichomonas infection   . UTI (lower urinary tract infection)   . Yeast infection     Patient Active Problem List   Diagnosis Date Noted  . Visit for wound check 10/28/2016  . Indication for care in labor or delivery 12/20/2013  . Pregnancy 12/07/2013  . Bacterial vaginosis 07/30/2012  . Impetigo 07/30/2012    Past Surgical History:  Procedure Laterality Date  . DILATION AND CURETTAGE OF UTERUS    . INDUCED ABORTION     x 2    OB History    Gravida Para Term Preterm AB Living   8 2 2  0 5 2   SAB TAB Ectopic Multiple Live Births   1 4 0 0 1       Home Medications    Prior to Admission medications   Medication Sig Start Date End Date Taking? Authorizing Provider  morphine (MSIR) 15 MG tablet Take 1 tablet (15 mg total) by mouth every 6 (six) hours as needed for severe pain. 10/26/16   Chase PicketJaime Pilcher Ward, PA-C  sulfamethoxazole-trimethoprim (BACTRIM DS,SEPTRA DS) 800-160 MG tablet Take 1 tablet by mouth 2 (two) times daily. 10/26/16 11/02/16  Chase PicketJaime Pilcher Ward, PA-C    Family History Family History  Problem Relation Age of Onset  . Diabetes Maternal Aunt   . Anesthesia problems Neg Hx   . Other Neg Hx   . Hearing loss Neg Hx     Social  History Social History  Substance Use Topics  . Smoking status: Never Smoker  . Smokeless tobacco: Never Used  . Alcohol use Yes     Comment: occasional alcohol     Allergies   Latex; Percocet [oxycodone-acetaminophen]; and Tylenol [acetaminophen]   Review of Systems Review of Systems  Constitutional: Negative for fever.  Respiratory: Negative for shortness of breath.   Cardiovascular: Negative for chest pain.  Gastrointestinal: Negative for nausea and vomiting.  Genitourinary: Negative for difficulty urinating.  Musculoskeletal: Negative for arthralgias.  Skin: Positive for color change and wound.     Physical Exam Updated Vital Signs BP (!) 136/96   Pulse 79   Temp 98.7 F (37.1 C) (Oral)   Resp 14   SpO2 100%   Physical Exam  Constitutional: She is oriented to person, place, and time. She appears well-developed and well-nourished. No distress.  HENT:  Head: Normocephalic and atraumatic.  Right Ear: External ear normal.  Left Ear: External ear normal.  Eyes: Conjunctivae are normal. No scleral icterus.  Cardiovascular: Normal rate, regular rhythm and normal heart sounds.   No murmur heard. Pulmonary/Chest: Effort normal and breath sounds normal. She has no wheezes.  Musculoskeletal: Normal range of motion. She exhibits no deformity.  Lymphadenopathy:    She has no cervical adenopathy.  Neurological: She is alert and oriented to person, place, and time.  Skin: Skin is warm and dry. Capillary refill takes less than 2 seconds.  2 right axillary abscesses noted. One abscess has packing in place with scant purulent and bloody discharge noted without underlying or surrounding induration, fluctuance, erythema and mildly tender. Second abscesses (below) has moderate fluctuance, edema and is exquisitely tender. No packing noted in the second abscesses. It appears this abscess has closed and collected purulent discharge within it.   Psychiatric: She has a normal mood and  affect. Her behavior is normal. Judgment and thought content normal.  Nursing note and vitals reviewed.    ED Treatments / Results  Labs (all labs ordered are listed, but only abnormal results are displayed) Labs Reviewed - No data to display  EKG  EKG Interpretation None       Radiology No results found.  Procedures Procedures (including critical care time)  INCISION AND DRAINAGE Performed by: Liberty Handy Consent: Verbal consent obtained. Risks and benefits: risks, benefits and alternatives were discussed Type: abscess  Body area: right axilla   Anesthesia: local infiltration  Incision was made with a scalpel.  Local anesthetic: lidocaine 2 % epinephrine  Anesthetic total: 5 ml  Complexity: complex Blunt dissection to break up loculations  Drainage: purulent, bloody with clots  Drainage amount: moderate  Packing material: 1/4 in iodoform gauze  Patient tolerance: Patient tolerated the procedure well with no immediate complications.    Medications Ordered in ED Medications  lidocaine-EPINEPHrine (XYLOCAINE W/EPI) 2 %-1:200000 (PF) injection 10 mL (10 mLs Infiltration Given by Other 10/28/16 1119)  ibuprofen (ADVIL,MOTRIN) tablet 800 mg (800 mg Oral Given 10/28/16 1118)     Initial Impression / Assessment and Plan / ED Course  I have reviewed the triage vital signs and the nursing notes.  Pertinent labs & imaging results that were available during my care of the patient were reviewed by me and considered in my medical decision making (see chart for details).    Patient here for wound check after incision and drainage of 2 axillary abscesses 2 days ago. No fever. One of these abscesses appears to be healing well, packing was removed and there was scant amount of blood draining without underlying fluctuance, induration. The abscess below appears to have closed and we collected purulent discharge within it. Incision and drainage was performed on this  abscess which allowed moderate amounts of purulent discharge and blood clots to be removed. Packing material was placed to prevent re-collection. Patient will be discharged, advised to take ibuprofen for inflammation and pain, continue doing hot compresses and follow-up with PCP in 2 days for wound check. Patient already has antibiotic prescribed to her from initial incision and drainage 2 days ago.  Final Clinical Impressions(s) / ED Diagnoses   Final diagnoses:  Encounter for recheck of abscess following incision and drainage  Abscess    New Prescriptions New Prescriptions   No medications on file     Jerrell Mylar 10/28/16 1147    Arby Barrette, MD 11/05/16 [phone]

## 2016-10-28 NOTE — Discharge Instructions (Signed)
Please continue taking your antibiotics as prescribed. Take 600 mg of ibuprofen 3 times a day for inflammation and pain. Continue doing hot compresses as much as possible throughout the day. Please follow up with your primary care provider in 2 days for wound check. If you're unable to see your primary care provider in 2 days you may return to the emergency department for wound check.

## 2016-10-30 ENCOUNTER — Emergency Department (HOSPITAL_BASED_OUTPATIENT_CLINIC_OR_DEPARTMENT_OTHER)
Admission: EM | Admit: 2016-10-30 | Discharge: 2016-10-30 | Disposition: A | Discharge status: Home / Self Care | Payer: Medicaid Other | Attending: Emergency Medicine | Admitting: Emergency Medicine

## 2016-10-30 ENCOUNTER — Encounter (HOSPITAL_BASED_OUTPATIENT_CLINIC_OR_DEPARTMENT_OTHER): Payer: Self-pay

## 2016-10-30 DIAGNOSIS — J45909 Unspecified asthma, uncomplicated: Secondary | ICD-10-CM | POA: Diagnosis not present

## 2016-10-30 DIAGNOSIS — Z91040 Latex allergy status: Secondary | ICD-10-CM | POA: Insufficient documentation

## 2016-10-30 DIAGNOSIS — Z09 Encounter for follow-up examination after completed treatment for conditions other than malignant neoplasm: Secondary | ICD-10-CM

## 2016-10-30 DIAGNOSIS — L02411 Cutaneous abscess of right axilla: Secondary | ICD-10-CM | POA: Insufficient documentation

## 2016-10-30 DIAGNOSIS — Z48817 Encounter for surgical aftercare following surgery on the skin and subcutaneous tissue: Secondary | ICD-10-CM | POA: Diagnosis present

## 2016-10-30 MED ORDER — IBUPROFEN 400 MG PO TABS
600.0000 mg | ORAL_TABLET | Freq: Once | ORAL | Status: AC
  Administered 2016-10-30: 600 mg via ORAL
  Filled 2016-10-30: qty 1

## 2016-10-30 NOTE — Discharge Instructions (Signed)
Continue taking her antibiotics. Motrin for pain. Warm compresses several times a day. May use Q-tips to keep the wound open and draining. May pull the packing out slightly tomorrow. Follow-up in 2 days for wound recheck.

## 2016-10-30 NOTE — ED Triage Notes (Signed)
Pt states she is here for packing removal to right axilla-NAD-steady gait

## 2016-10-30 NOTE — ED Provider Notes (Signed)
[facility]   CSN: [id number] Arrival date & time: 10/30/16  1110     History   Chief Complaint Chief Complaint  Patient presents with  . Follow-up    HPI Shelly Gibbs is a 31 y.o. female.  HPI  31 y.o. female presents for wound check after incision and drainage of 2 right axillary abscesses 4 days ago and repeat I&D 2 days ago. Patient states that abscesses seem to be improving. Mild irritation due to itching but describes the pain as reducing. Patient has been taking prescribed antibiotics but as not been doing hot compresses. Patient denies fevers, redness, swelling. Has noted purulent drainage from the site.  Past Medical History:  Diagnosis Date  . Asthma    inhaler last used "long time ago"  . BV (bacterial vaginosis)   . Chlamydia   . Gonorrhea 08-03-2012  . Headache(784.0)   . Trichomonas infection   . UTI (lower urinary tract infection)   . Yeast infection     Patient Active Problem List   Diagnosis Date Noted  . Visit for wound check 10/28/2016  . Indication for care in labor or delivery 12/20/2013  . Pregnancy 12/07/2013  . Bacterial vaginosis 07/30/2012  . Impetigo 07/30/2012    Past Surgical History:  Procedure Laterality Date  . DILATION AND CURETTAGE OF UTERUS    . INDUCED ABORTION     x 2    OB History    Gravida Para Term Preterm AB Living   8 2 2  0 5 2   SAB TAB Ectopic Multiple Live Births   1 4 0 0 1       Home Medications    Prior to Admission medications   Medication Sig Start Date End Date Taking? Authorizing Provider  sulfamethoxazole-trimethoprim (BACTRIM DS,SEPTRA DS) 800-160 MG tablet Take 1 tablet by mouth 2 (two) times daily. 10/26/16 11/02/16  Chase Picket Ward, PA-C    Family History Family History  Problem Relation Age of Onset  . Diabetes Maternal Aunt   . Anesthesia problems Neg Hx   . Other Neg Hx   . Hearing loss Neg Hx     Social History Social History  Substance Use Topics   . Smoking status: Never Smoker  . Smokeless tobacco: Never Used  . Alcohol use Yes     Comment: occasional      Allergies   Latex; Percocet [oxycodone-acetaminophen]; and Tylenol [acetaminophen]   Review of Systems Review of Systems  Constitutional: Negative for chills and fever.  Gastrointestinal: Negative for nausea and vomiting.  Skin: Positive for wound.     Physical Exam Updated Vital Signs BP (!) 132/92 (BP Location: Left Arm)   Pulse 68   Temp 98.6 F (37 C) (Oral)   Resp 18   Ht 5\' 2"  (1.575 m)   Wt 78 kg   LMP 10/29/2016   SpO2 100%   BMI 31.46 kg/m   Physical Exam  Constitutional: She appears well-developed and well-nourished. No distress.  Eyes: Right eye exhibits no discharge. Left eye exhibits no discharge. No scleral icterus.  Pulmonary/Chest: No respiratory distress.  Musculoskeletal: Normal range of motion.  Neurological: She is alert.  Skin: No pallor.  2 right axillary abscesses noted. One abscess has packing in place with scant purulent and bloody discharge noted without underlying or surrounding induration, fluctuance, erythema and mildly tender. Second abscesses (above) that is healed without any signs of erythema, induration, fluctuance, tenderness. The packing was removed with significant  amount of purulent drainage.   Nursing note and vitals reviewed.    ED Treatments / Results  Labs (all labs ordered are listed, but only abnormal results are displayed) Labs Reviewed  AEROBIC CULTURE (SUPERFICIAL SPECIMEN)    EKG  EKG Interpretation None       Radiology No results found.  Procedures Procedures (including critical care time)  Medications Ordered in ED Medications  ibuprofen (ADVIL,MOTRIN) tablet 600 mg (600 mg Oral Given 10/30/16 1250)     Initial Impression / Assessment and Plan / ED Course  I have reviewed the triage vital signs and the nursing notes.  Pertinent labs & imaging results that were available during my  care of the patient were reviewed by me and considered in my medical decision making (see chart for details).     Patient here for wound check after incision and drainage of 2 axillary abscesses 4 and 2 days ago. No fever. One of these abscesses appears to be healing well without any packing in place. No erythema, edema, induration, fluctuance, tenderness noted to this area. No surrounding cellulitis. Second abscess with packing was removed with moderate amount of purulent drainage and scant amount of blood. There is no underlying area of induration or fluctuance it is draining on its own. No surrounding cellulitis. Mildly tender to palpation. Packing material was placed to prevent re-collection. Encourage patient to use Q-tips keep the area clean and open. Have encouraged warm compresses. Have sent a wound culture. Continued on antibiotics for the next 3 days. Patient will be discharged, advised to take ibuprofen for inflammation and pain, continue doing hot compresses and follow-up with PCP in 2 days for wound check. Patient already has antibiotic prescribed to her from initial incision and drainage 4 days ago.  Final Clinical Impressions(s) / ED Diagnoses   Final diagnoses:  Encounter for recheck of abscess following incision and drainage    New Prescriptions New Prescriptions   No medications on file     Rise MuKenneth T Jaydah Stahle, PA-C 10/30/16 1313    Geoffery Lyonsouglas Delo, MD 10/30/16 1424

## 2016-11-01 LAB — AEROBIC CULTURE W GRAM STAIN (SUPERFICIAL SPECIMEN): Culture: NORMAL

## 2016-11-01 LAB — AEROBIC CULTURE  (SUPERFICIAL SPECIMEN)

## 2016-11-21 ENCOUNTER — Ambulatory Visit (INDEPENDENT_AMBULATORY_CARE_PROVIDER_SITE_OTHER): Payer: Medicaid Other | Admitting: Obstetrics & Gynecology

## 2016-11-21 ENCOUNTER — Other Ambulatory Visit (HOSPITAL_COMMUNITY)
Admission: RE | Admit: 2016-11-21 | Discharge: 2016-11-21 | Disposition: A | Discharge status: Home / Self Care | Payer: Medicaid Other | Source: Ambulatory Visit | Attending: Obstetrics & Gynecology | Admitting: Obstetrics & Gynecology

## 2016-11-21 VITALS — BP 144/103 | HR 72 | Ht 62.0 in | Wt 167.1 lb

## 2016-11-21 DIAGNOSIS — Z113 Encounter for screening for infections with a predominantly sexual mode of transmission: Secondary | ICD-10-CM

## 2016-11-21 DIAGNOSIS — Z01419 Encounter for gynecological examination (general) (routine) without abnormal findings: Secondary | ICD-10-CM

## 2016-11-21 DIAGNOSIS — Z0000 Encounter for general adult medical examination without abnormal findings: Secondary | ICD-10-CM | POA: Insufficient documentation

## 2016-11-21 MED ORDER — METRONIDAZOLE 500 MG PO TABS: ORAL_TABLET | ORAL | 0 refills | Status: DC

## 2016-11-21 NOTE — Progress Notes (Signed)
Subjective:    Shelly Gibbs is a 31 y.o. S AA P2 (9 and 2 yo kids) female who presents for an annual exam. She thinks that she has trich again. BF threw up his medicine. They broke up 3 days ago.  The patient is not currently sexually active. GYN screening history: last pap: was normal. The patient wears seatbelts: yes. The patient participates in regular exercise: yes. Has the patient ever been transfused or tattooed?: yes. The patient reports that there is not domestic violence in her life.   Menstrual History: OB History    Gravida Para Term Preterm AB Living   0 5 2   SAB TAB Ectopic Multiple Live Births   1 4 0 0 1      Menarche age: 63 Patient's last menstrual period was 10/29/2016.    The following portions of the patient's history were reviewed and updated as appropriate: allergies, current medications, past family history, past medical history, past social history, past surgical history and problem list.  Review of Systems Pertinent items are noted in HPI.   Depo provera gives her heachaches. She would like a different form of birth control. She has tried an IUD, OCPs, and depo provera.  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 FH- no gyn/colon/breast cancer   Objective:    BP (!) 144/103   Pulse 72   Ht  (1.575 m)   Wt 167 lb 1.6 oz (75.8 kg)   LMP 10/29/2016   BMI 30.56 kg/m   General Appearance:    Alert, cooperative, no distress, appears stated age  Head:    Normocephalic, without obvious abnormality, atraumatic  Eyes:    PERRL, conjunctiva/corneas clear, EOM's intact, fundi    benign, both eyes  Ears:    Normal TM's and external ear canals, both ears  Nose:   Nares normal, septum midline, mucosa normal, no drainage    or sinus tenderness  Throat:   Lips, mucosa, and tongue normal; teeth and gums normal  Neck:   Supple, symmetrical, trachea midline, no adenopathy;    thyroid:  no enlargement/tenderness/nodules; no carotid   bruit or JVD  Back:      Symmetric, no curvature, ROM normal, no CVA tenderness  Lungs:     Clear to auscultation bilaterally, respirations unlabored  Chest Wall:    No tenderness or deformity   Heart:    Regular rate and rhythm, S1 and S2 normal, no murmur, rub   or gallop  Breast Exam:    No tenderness, masses, or nipple abnormality  Abdomen:     Soft, non-tender, bowel sounds active all four quadrants,    no masses, no organomegaly  Genitalia:    Normal female without lesion, discharge or tenderness, small amount of blood in vault, NSSA, NT, no adnexal masses or tenderness     Extremities:   Extremities normal, atraumatic, no cyanosis or edema  Pulses:   2+ and symmetric all extremities  Skin:   Skin color, texture, turgor normal, no rashes or lesions  Lymph nodes:   Cervical, supraclavicular, and axillary nodes normal  Neurologic:   CNII-XII intact, normal strength, sensation and reflexes    throughout  .    Assessment:    Healthy female exam.    Plan:     Thin prep Pap smear.   STI testing Rec CONDOMS!!! Retreat trich with flagyl  Discussed contraceptive options

## 2016-11-21 NOTE — Progress Notes (Signed)
Patient States of having discharge and odor since the end of February. Patient also does not want her Depo shot today she wants info on other birth control.

## 2016-11-22 LAB — RPR: RPR Ser Ql: NONREACTIVE

## 2016-11-22 LAB — HEPATITIS C ANTIBODY: Hep C Virus Ab: <0.1 s/co ratio (ref 0.0–0.9)

## 2016-11-22 LAB — HIV ANTIBODY (ROUTINE TESTING W REFLEX): HIV Screen 4th Generation wRfx: NONREACTIVE

## 2016-11-22 LAB — HEPATITIS B SURFACE ANTIGEN: Hepatitis B Surface Ag: NEGATIVE

## 2016-11-26 LAB — CYTOLOGY - PAP
Chlamydia: NEGATIVE
Diagnosis: NEGATIVE
HPV: NOT DETECTED
Neisseria Gonorrhea: NEGATIVE

## 2017-01-21 ENCOUNTER — Ambulatory Visit: Payer: Medicaid Other

## 2017-02-10 ENCOUNTER — Inpatient Hospital Stay (HOSPITAL_COMMUNITY)
Admission: AD | Admit: 2017-02-10 | Discharge: 2017-02-10 | Disposition: A | Discharge status: Home / Self Care | Payer: Medicaid Other | Source: Ambulatory Visit | Attending: Obstetrics & Gynecology | Admitting: Obstetrics & Gynecology

## 2017-02-10 DIAGNOSIS — Z79899 Other long term (current) drug therapy: Secondary | ICD-10-CM | POA: Diagnosis not present

## 2017-02-10 DIAGNOSIS — A599 Trichomoniasis, unspecified: Secondary | ICD-10-CM | POA: Insufficient documentation

## 2017-02-10 DIAGNOSIS — Z833 Family history of diabetes mellitus: Secondary | ICD-10-CM | POA: Insufficient documentation

## 2017-02-10 DIAGNOSIS — Z888 Allergy status to other drugs, medicaments and biological substances status: Secondary | ICD-10-CM | POA: Diagnosis not present

## 2017-02-10 DIAGNOSIS — Z885 Allergy status to narcotic agent status: Secondary | ICD-10-CM | POA: Insufficient documentation

## 2017-02-10 DIAGNOSIS — Z8489 Family history of other specified conditions: Secondary | ICD-10-CM | POA: Insufficient documentation

## 2017-02-10 DIAGNOSIS — J45909 Unspecified asthma, uncomplicated: Secondary | ICD-10-CM | POA: Diagnosis not present

## 2017-02-10 LAB — URINALYSIS, ROUTINE W REFLEX MICROSCOPIC
Bilirubin Urine: NEGATIVE
Glucose, UA: NEGATIVE mg/dL
Hgb urine dipstick: NEGATIVE
Ketones, ur: NEGATIVE mg/dL
Nitrite: NEGATIVE
Protein, ur: NEGATIVE mg/dL
Specific Gravity, Urine: 1.018 (ref 1.005–1.030)
pH: 5 (ref 5.0–8.0)

## 2017-02-10 LAB — WET PREP, GENITAL
Clue Cells Wet Prep HPF POC: NONE SEEN
Sperm: NONE SEEN
Yeast Wet Prep HPF POC: NONE SEEN

## 2017-02-10 LAB — POCT PREGNANCY, URINE: Preg Test, Ur: NEGATIVE

## 2017-02-10 MED ORDER — METRONIDAZOLE 500 MG PO TABS
2000.0000 mg | ORAL_TABLET | ORAL | Status: AC
  Administered 2017-02-10: 2000 mg via ORAL
  Filled 2017-02-10: qty 4

## 2017-02-10 NOTE — Discharge Instructions (Signed)
Metronidazole tablets or capsules What is this medicine? METRONIDAZOLE (me troe NI da zole) is an antiinfective. It is used to treat certain kinds of bacterial and protozoal infections. It will not work for colds, flu, or other viral infections. This medicine may be used for other purposes; ask your health care provider or pharmacist if you have questions. COMMON BRAND NAME(S): Flagyl What should I tell my health care provider before I take this medicine? They need to know if you have any of these conditions: -anemia or other blood disorders -disease of the nervous system -fungal or yeast infection -if you drink alcohol containing drinks -liver disease -seizures -an unusual or allergic reaction to metronidazole, or other medicines, foods, dyes, or preservatives -pregnant or trying to get pregnant -breast-feeding How should I use this medicine? Take this medicine by mouth with a full glass of water. Follow the directions on the prescription label. Take your medicine at regular intervals. Do not take your medicine more often than directed. Take all of your medicine as directed even if you think you are better. Do not skip doses or stop your medicine early. Talk to your pediatrician regarding the use of this medicine in children. Special care may be needed. Overdosage: If you think you have taken too much of this medicine contact a poison control center or emergency room at once. NOTE: This medicine is only for you. Do not share this medicine with others. What if I miss a dose? If you miss a dose, take it as soon as you can. If it is almost time for your next dose, take only that dose. Do not take double or extra doses. What may interact with this medicine? Do not take this medicine with any of the following medications: -alcohol or any product that contains alcohol -amprenavir oral solution -cisapride -disulfiram -dofetilide -dronedarone -paclitaxel injection -pimozide -ritonavir oral  solution -sertraline oral solution -sulfamethoxazole-trimethoprim injection -thioridazine -ziprasidone This medicine may also interact with the following medications: -birth control pills -cimetidine -lithium -other medicines that prolong the QT interval (cause an abnormal heart rhythm) -phenobarbital -phenytoin -warfarin This list may not describe all possible interactions. Give your health care provider a list of all the medicines, herbs, non-prescription drugs, or dietary supplements you use. Also tell them if you smoke, drink alcohol, or use illegal drugs. Some items may interact with your medicine. What should I watch for while using this medicine? Tell your doctor or health care professional if your symptoms do not improve or if they get worse. You may get drowsy or dizzy. Do not drive, use machinery, or do anything that needs mental alertness until you know how this medicine affects you. Do not stand or sit up quickly, especially if you are an older patient. This reduces the risk of dizzy or fainting spells. Avoid alcoholic drinks while you are taking this medicine and for three days afterward. Alcohol may make you feel dizzy, sick, or flushed. If you are being treated for a sexually transmitted disease, avoid sexual contact until you have finished your treatment. Your sexual partner may also need treatment. What side effects may I notice from receiving this medicine? Side effects that you should report to your doctor or health care professional as soon as possible: -allergic reactions like skin rash or hives, swelling of the face, lips, or tongue -confusion, clumsiness -difficulty speaking -discolored or sore mouth -dizziness -fever, infection -numbness, tingling, pain or weakness in the hands or feet -trouble passing urine or change in the amount of  urine -redness, blistering, peeling or loosening of the skin, including inside the mouth -seizures -unusually weak or  tired -vaginal irritation, dryness, or discharge Side effects that usually do not require medical attention (report to your doctor or health care professional if they continue or are bothersome): -diarrhea -headache -irritability -metallic taste -nausea -stomach pain or cramps -trouble sleeping This list may not describe all possible side effects. Call your doctor for medical advice about side effects. You may report side effects to FDA at 1-800-FDA-1088. Where should I keep my medicine? Keep out of the reach of children. Store at room temperature below 25 degrees C (77 degrees F). Protect from light. Keep container tightly closed. Throw away any unused medicine after the expiration date. NOTE: This sheet is a summary. It may not cover all possible information. If you have questions about this medicine, talk to your doctor, pharmacist, or health care provider.  2018 Elsevier/Gold Standard (2013-03-05 14:08:39) Trichomoniasis Trichomoniasis is an STI (sexually transmitted infection) that can affect both women and men. In women, the outer area of the female genitalia (vulva) and the vagina are affected. In men, the penis is mainly affected, but the prostate and other reproductive organs can also be involved. This condition can be treated with medicine. It often has no symptoms (is asymptomatic), especially in men. What are the causes? This condition is caused by an organism called Trichomonas vaginalis. Trichomoniasis most often spreads from person to person (is contagious) through sexual contact. What increases the risk? The following factors may make you more likely to develop this condition:  Having unprotected sexual intercourse.  Having sexual intercourse with a partner who has trichomoniasis.  Having multiple sexual partners.  Having had previous trichomoniasis infections or other STIs.  What are the signs or symptoms? In women, symptoms of trichomoniasis include:  Abnormal  vaginal discharge that is clear, white, gray, or yellow-green and foamy and has an unusual "fishy" odor.  Itching and irritation of the vagina and vulva.  Burning or pain during urination or sexual intercourse.  Genital redness and swelling.  In men, symptoms of trichomoniasis include:  Penile discharge that may be foamy or contain pus.  Pain in the penis. This may happen only when urinating.  Itching or irritation inside the penis.  Burning after urination or ejaculation.  How is this diagnosed? In women, this condition may be found during a routine Pap test or physical exam. It may be found in men during a routine physical exam. Your health care provider may perform tests to help diagnose this infection, such as:  Urine tests (men and women).  The following in women: ? Testing the pH of the vagina. ? A vaginal swab test that checks for the Trichomonas vaginalis organism. ? Testing vaginal secretions.  Your health care provider may test you for other STIs, including HIV (human immunodeficiency virus). How is this treated? This condition is treated with medicine taken by mouth (orally), such as metronidazole or tinidazole to fight the infection. Your sexual partner(s) may also need to be tested and treated.  If you are a woman and you plan to become pregnant or think you may be pregnant, tell your health care provider right away. Some medicines that are used to treat the infection should not be taken during pregnancy.  Your health care provider may recommend over-the-counter medicines or creams to help relieve itching or irritation. You may be tested for infection again 3 months after treatment. Follow these instructions at home:  Take and  use over-the-counter and prescription medicines, including creams, only as told by your health care provider.  Do not have sexual intercourse until one week after you finish your medicine, or until your health care provider approves. Ask your  health care provider when you may resume sexual intercourse.  (Women) Do not douche or wear tampons while you have the infection.  Discuss your infection with your sexual partner(s). Make sure that your partner gets tested and treated, if necessary.  Keep all follow-up visits as told by your health care provider. This is important. How is this prevented?  Use condoms every time you have sex. Using condoms correctly and consistently can help protect against STIs.  Avoid having multiple sexual partners.  Talk with your sexual partner about any symptoms that either of you may have, as well as any history of STIs.  Get tested for STIs and STDs (sexually transmitted diseases) before you have sex. Ask your partner to do the same.  Do not have sexual contact if you have symptoms of trichomoniasis or another STI. Contact a health care provider if:  You still have symptoms after you finish your medicine.  You develop pain in your abdomen.  You have pain when you urinate.  You have bleeding after sexual intercourse.  You develop a rash.  You feel nauseous or you vomit.  You plan to become pregnant or think you may be pregnant. Summary  Trichomoniasis is an STI (sexually transmitted infection) that can affect both women and men.  This condition often has no symptoms (is asymptomatic), especially in men.  You should not have sexual intercourse until one week after you finish your medicine, or until your health care provider approves. Ask your health care provider when you may resume sexual intercourse.  Discuss your infection with your sexual partner. Make sure that your partner gets tested and treated, if necessary. This information is not intended to replace advice given to you by your health care provider. Make sure you discuss any questions you have with your health care provider. Document Released: 01/22/2001 Document Revised: 06/21/2016 Document Reviewed: 06/21/2016 Elsevier  Interactive Patient Education  2017 [employer]  Safe Sex Practicing safe sex means taking steps before and during sex to reduce your risk of:  Getting an STD (sexually transmitted disease).  Giving your partner an STD.  Unwanted pregnancy.  How can I practice safe sex?  To practice safe sex:  Limit your sexual partners to only one partner who is having sex with only you.  Avoid using alcohol and recreational drugs before having sex. These substances can affect your judgment.  Before having sex with a new partner: ? Talk to your partner about past partners, past STDs, and drug use. ? You and your partner should be screened for STDs and discuss the results with each other.  Check your body regularly for sores, blisters, rashes, or unusual discharge. If you notice any of these problems, visit your health care provider.  If you have symptoms of an infection or you are being treated for an STD, avoid sexual contact.  While having sex, use a condom. Make sure to: ? Use a condom every time you have vaginal, oral, or anal sex. Both females and males should wear condoms during oral sex. ? Keep condoms in place from the beginning to the end of sexual activity. ? Use a latex condom, if possible. Latex condoms offer the best protection. ? Use only water-based lubricants or oils to lubricate a condom. Using  petroleum-based lubricants or oils will weaken the condom and increase the chance that it will break.  See your health care provider for regular screenings, exams, and tests for STDs.  Talk with your health care provider about the form of birth control (contraception) that is best for you.  Get vaccinated against hepatitis B and human papillomavirus (HPV).  If you are at risk of being infected with HIV (human immunodeficiency virus), talk with your health care provider about taking a prescription medicine to prevent HIV infection. You are considered at risk for HIV if: ? You are a  man who has sex with other men. ? You are a heterosexual man or woman who is sexually active with more than one partner. ? You take drugs by injection. ? You are sexually active with a partner who has HIV.  This information is not intended to replace advice given to you by your health care provider. Make sure you discuss any questions you have with your health care provider. Document Released: 09/05/2004 Document Revised: 12/13/2015 Document Reviewed: 06/18/2015 Elsevier Interactive Patient Education  [employer].

## 2017-02-10 NOTE — MAU Note (Addendum)
Pt states she had 4 "faintly" +upt at home. States she thinks she has a yeast infection, bv or possibly trich. Was treated for trich, but unsure if partner was treated. States she has a "fishy, yellow" discharge, which she has had for "a while" now. Pt states she has pain with intercourse, but none now. Pt denies vag bleeding. LMP: was in June, states it was heavier than normal

## 2017-02-10 NOTE — MAU Provider Note (Signed)
History     CSN: [id number]  Arrival date and time: 02/10/17 2134   First Provider Initiated Contact with Patient 02/10/17 2219      Chief Complaint  Patient presents with  . Vaginal Discharge   HPI  Shelly Gibbs is a 31 yo [phone] non-pregnant presenting with complaints of discharge and unsure if pregnant - LMP was in June.  She was recently treated for trichomonas, but does not think her partner was treated as he originally told her.  She states she took him to be treated yesterday.  She reports of "fisht, yellow" discharge.  She also complains of RT groin external irritation.  She denies abdominal pain or VB.  She reports having 4 "faintly (+) HPT.     Past Medical History:  Diagnosis Date  . Asthma    inhaler last used "long time ago"  . BV (bacterial vaginosis)   . Chlamydia   . Gonorrhea 08-03-2012  . Headache(784.0)   . Trichomonas infection   . UTI (lower urinary tract infection)   . Yeast infection     Past Surgical History:  Procedure Laterality Date  . DILATION AND CURETTAGE OF UTERUS    . INDUCED ABORTION     x 2    Family History  Problem Relation Age of Onset  . Diabetes Maternal Aunt   . Anesthesia problems Neg Hx   . Other Neg Hx   . Hearing loss Neg Hx     Social History  Substance Use Topics  . Smoking status: Never Smoker  . Smokeless tobacco: Never Used  . Alcohol use Yes     Comment: occasional     Allergies:  Allergies  Allergen Reactions  . Latex Itching    Burning   . Percocet [Oxycodone-Acetaminophen] Hives  . Tylenol [Acetaminophen] Hives    Prescriptions Prior to Admission  Medication Sig Dispense Refill Last Dose  . metroNIDAZOLE (FLAGYL) 500 MG tablet Take two tablets by mouth twice a day, for one day.  Or you can take all four tablets at once if you can tolerate it. 4 tablet 0     Review of Systems  Constitutional: Negative.   HENT: Negative.   Eyes: Negative.   Respiratory: Negative.   Cardiovascular:  Negative.   Gastrointestinal: Negative.   Endocrine: Negative.   Genitourinary: Positive for vaginal discharge.       Self-collected wet prep  Musculoskeletal: Negative.   Skin:       RT groin irritation  Allergic/Immunologic: Negative.   Neurological: Negative.   Hematological: Negative.   Psychiatric/Behavioral: Negative.    Physical Exam   Blood pressure (!) 149/89, pulse 70, temperature 98.4 F (36.9 C), temperature source Oral, resp. rate 19, height 5\' 2"  (1.575 m), weight 78 kg (172 lb), SpO2 100 %.  Physical Exam  Constitutional: She is oriented to person, place, and time. She appears well-developed and well-nourished.  HENT:  Head: Normocephalic and atraumatic.  Eyes: Pupils are equal, round, and reactive to light.  Neck: Normal range of motion.  Cardiovascular: Normal rate and regular rhythm.   Genitourinary:  Genitourinary Comments: GC/CT sent from urine sample and Wet prep was self-collected by the patient   Musculoskeletal: Normal range of motion.  Neurological: She is alert and oriented to person, place, and time.  Psychiatric: She has a normal mood and affect. Her behavior is normal. Judgment and thought content normal.    MAU Course  Procedures  MDM [facility] UPT - negative Wet Prep GC/CT  Flagyl 2 gm po now  Results for orders placed or performed during the hospital encounter of 02/10/17 (from the past 24 hour(s))  Urinalysis, Routine w reflex microscopic     Status: Abnormal   Collection Time: 02/10/17  9:48 PM  Result Value Ref Range   Color, Urine YELLOW YELLOW   APPearance HAZY (A) CLEAR   Specific Gravity, Urine 1.018 1.005 - 1.030   pH 5.0 5.0 - 8.0   Glucose, UA NEGATIVE NEGATIVE mg/dL   Hgb urine dipstick NEGATIVE NEGATIVE   Bilirubin Urine NEGATIVE NEGATIVE   Ketones, ur NEGATIVE NEGATIVE mg/dL   Protein, ur NEGATIVE NEGATIVE mg/dL   Nitrite NEGATIVE NEGATIVE   Leukocytes, UA LARGE (A) NEGATIVE   RBC / HPF 6-30 0 - 5 RBC/hpf   WBC, UA 6-30  0 - 5 WBC/hpf   Bacteria, UA RARE (A) NONE SEEN   Squamous Epithelial / LPF 6-30 (A) NONE SEEN   Mucous PRESENT   Wet prep, genital     Status: Abnormal   Collection Time: 02/10/17  9:53 PM  Result Value Ref Range   Yeast Wet Prep HPF POC NONE SEEN NONE SEEN   Trich, Wet Prep PRESENT (A) NONE SEEN   Clue Cells Wet Prep HPF POC NONE SEEN NONE SEEN   WBC, Wet Prep HPF POC FEW (A) NONE SEEN   Sperm NONE SEEN   Pregnancy, urine POC     Status: None   Collection Time: 02/10/17 10:01 PM  Result Value Ref Range   Preg Test, Ur NEGATIVE NEGATIVE    Assessment and Plan  Trichomonosis - Treated prior to d/c home - No unprotected sex until 48 hrs after both treatment - F/U with [facility] or GYN provider of choice  Discharge home Patient verbalized an understanding of the plan of care and agrees.   Shelly Moraolitta Quashon Jesus, MSN, CNM 02/10/2017, 10:19 PM

## 2017-02-11 LAB — GC/CHLAMYDIA PROBE AMP (~~LOC~~) NOT AT ARMC
Chlamydia: NEGATIVE
Neisseria Gonorrhea: NEGATIVE

## 2017-05-14 ENCOUNTER — Emergency Department (HOSPITAL_BASED_OUTPATIENT_CLINIC_OR_DEPARTMENT_OTHER)
Admission: EM | Admit: 2017-05-14 | Discharge: 2017-05-14 | Disposition: A | Discharge status: Home / Self Care | Payer: Medicaid Other | Attending: Emergency Medicine | Admitting: Emergency Medicine

## 2017-05-14 ENCOUNTER — Encounter (HOSPITAL_BASED_OUTPATIENT_CLINIC_OR_DEPARTMENT_OTHER): Payer: Self-pay | Admitting: Emergency Medicine

## 2017-05-14 DIAGNOSIS — B9689 Other specified bacterial agents as the cause of diseases classified elsewhere: Secondary | ICD-10-CM | POA: Diagnosis not present

## 2017-05-14 DIAGNOSIS — Z91040 Latex allergy status: Secondary | ICD-10-CM | POA: Insufficient documentation

## 2017-05-14 DIAGNOSIS — N898 Other specified noninflammatory disorders of vagina: Secondary | ICD-10-CM | POA: Diagnosis present

## 2017-05-14 DIAGNOSIS — N760 Acute vaginitis: Secondary | ICD-10-CM | POA: Diagnosis not present

## 2017-05-14 DIAGNOSIS — J45909 Unspecified asthma, uncomplicated: Secondary | ICD-10-CM | POA: Diagnosis not present

## 2017-05-14 LAB — URINALYSIS, ROUTINE W REFLEX MICROSCOPIC
Bilirubin Urine: NEGATIVE
Glucose, UA: NEGATIVE mg/dL
Hgb urine dipstick: NEGATIVE
Ketones, ur: 15 mg/dL — AB
Leukocytes, UA: NEGATIVE
Nitrite: NEGATIVE
Protein, ur: NEGATIVE mg/dL
Specific Gravity, Urine: >1.03 — ABNORMAL HIGH (ref 1.005–1.030)
pH: 6 (ref 5.0–8.0)

## 2017-05-14 LAB — WET PREP, GENITAL
Sperm: NONE SEEN
Trich, Wet Prep: NONE SEEN
Yeast Wet Prep HPF POC: NONE SEEN

## 2017-05-14 LAB — PREGNANCY, URINE: Preg Test, Ur: NEGATIVE

## 2017-05-14 MED ORDER — CLINDAMYCIN HCL 150 MG PO CAPS: 300.0000 mg | ORAL_CAPSULE | Freq: Two times a day (BID) | ORAL | 0 refills | Status: DC

## 2017-05-14 NOTE — ED Provider Notes (Signed)
[facility] Provider Note   CSN: [id number] Arrival date & time: 05/14/17  1810     History   Chief Complaint Chief Complaint  Patient presents with  . Vaginal Discharge    HPI Shelly Gibbs is a 31 y.o. female.  Patient presents with complaint of vaginal discharge and odor ongoing for 2 months. She states that at the onset, she was tested for STI and had a positive test for Trichomonas. She was treated with Flagyl. Her one partner was also treated at that time. Despite this, her symptoms did not improve. She reports having only one sexual partner. She does not know if her current partner has had other partners. She denies any abdominal pain, dysuria, hematuria, increased frequency or urgency. She does have some lower back pain she relates to her job. She has used over-the-counter products and vinegar to treat her symptoms without improvement.      Past Medical History:  Diagnosis Date  . Asthma    inhaler last used "long time ago"  . BV (bacterial vaginosis)   . Chlamydia   . Gonorrhea 08-03-2012  . Headache(784.0)   . Trichomonas infection   . UTI (lower urinary tract infection)   . Yeast infection     Patient Active Problem List   Diagnosis Date Noted  . Visit for wound check 10/28/2016  . Indication for care in labor or delivery 12/20/2013  . Pregnancy 12/07/2013  . Bacterial vaginosis 07/30/2012  . Impetigo 07/30/2012    Past Surgical History:  Procedure Laterality Date  . DILATION AND CURETTAGE OF UTERUS    . INDUCED ABORTION     x 2    OB History    Gravida Para Term Preterm AB Living   0 5 2   SAB TAB Ectopic Multiple Live Births   1 4 0 0 1       Home Medications    Prior to Admission medications   Not on File    Family History Family History  Problem Relation Age of Onset  . Diabetes Maternal Aunt   . Anesthesia problems Neg Hx   . Other Neg Hx   . Hearing loss Neg Hx     Social History Social History    Substance Use Topics  . Smoking status: Never Smoker  . Smokeless tobacco: Never Used  . Alcohol use Yes     Comment: occasional      Allergies   Latex; Percocet [oxycodone-acetaminophen]; and Tylenol [acetaminophen]   Review of Systems Review of Systems  Constitutional: Negative for fever.  HENT: Negative for rhinorrhea and sore throat.   Eyes: Negative for redness.  Respiratory: Negative for cough.   Cardiovascular: Negative for chest pain.  Gastrointestinal: Negative for abdominal pain, diarrhea, nausea and vomiting.  Genitourinary: Positive for vaginal discharge. Negative for dysuria, frequency, urgency, vaginal bleeding and vaginal pain.  Musculoskeletal: Positive for back pain. Negative for myalgias.  Skin: Negative for rash.  Neurological: Negative for headaches.     Physical Exam Updated Vital Signs BP 133/85 (BP Location: Right Arm)   Pulse 74   Temp 98.2 F (36.8 C) (Oral)   Resp 16   Ht  (1.575 m)   Wt 74.8 kg (165 lb)   LMP 04/19/2017   SpO2 100%   BMI 30.18 kg/m   Physical Exam  Constitutional: She appears well-developed and well-nourished.  HENT:  Head: Normocephalic and atraumatic.  Eyes: Conjunctivae are normal.  Neck: Normal range  of motion. Neck supple.  Pulmonary/Chest: No respiratory distress.  Genitourinary: Uterus normal. Pelvic exam was performed with patient supine. There is no rash or tenderness on the right labia. There is no rash or tenderness on the left labia. Uterus is not tender. Cervix exhibits no motion tenderness. Right adnexum displays no mass, no tenderness and no fullness. Left adnexum displays no mass, no tenderness and no fullness. No tenderness in the vagina. No signs of injury around the vagina. Vaginal discharge (white) found.  Neurological: She is alert.  Skin: Skin is warm and dry.  Psychiatric: She has a normal mood and affect.  Nursing note and vitals reviewed.    ED Treatments / Results  Labs (all labs  ordered are listed, but only abnormal results are displayed) Labs Reviewed  WET PREP, GENITAL - Abnormal; Notable for the following:       Result Value   Clue Cells Wet Prep HPF POC PRESENT (*)    WBC, Wet Prep HPF POC MODERATE (*)    All other components within normal limits  URINALYSIS, ROUTINE W REFLEX MICROSCOPIC - Abnormal; Notable for the following:    Specific Gravity, Urine >1.030 (*)    Ketones, ur 15 (*)    All other components within normal limits  PREGNANCY, URINE  RPR  HIV ANTIBODY (ROUTINE TESTING)  GC/CHLAMYDIA PROBE AMP ([place]) NOT AT [facility]    EKG  EKG Interpretation None       Radiology No results found.  Procedures Procedures (including critical care time)  Medications Ordered in ED Medications - No data to display   Initial Impression / Assessment and Plan / ED Course  I have reviewed the triage vital signs and the nursing notes.  Pertinent labs & imaging results that were available during my care of the patient were reviewed by me and considered in my medical decision making (see chart for details).     Patient seen and examined. Work-up initiated.   Vital signs reviewed and are as follows: BP 133/85 (BP Location: Right Arm)   Pulse 74   Temp 98.2 F (36.8 C) (Oral)   Resp 16   Ht  (1.575 m)   Wt 74.8 kg (165 lb)   LMP 04/19/2017   SpO2 100%   BMI 30.18 kg/m   Pelvic exam performed with RN Joretta Bachelor as chaperone. Awaiting results.   7:35 PM patient updated on results. Given what prep with clue cells, and suggestive bothersome vaginal discharge, will treat for bacterial vaginosis. Patient states that clindamycin works better than Flagyl for her.  Patient offered empiric treatment for gonorrhea and chlamydia but she declines this due to excessive nausea with antibiotics. She would prefer to wait until her test results return to know if she needs treatment.  Patient urged to return with worsening symptoms or other concerns. Patient  verbalized understanding and agrees with plan.    Final Clinical Impressions(s) / ED Diagnoses   Final diagnoses:  BV (bacterial vaginosis)   Patient with vaginal discharge. Workup here reveals only discharge with clue cells. Patient tested as above. Declines treatment tonight. No clinical signs of PID. No signs of cystitis.  New Prescriptions New Prescriptions   CLINDAMYCIN (CLEOCIN) 150 MG CAPSULE    Take 2 capsules (300 mg total) by mouth 2 (two) times daily.     Renne Crigler, PA-C 05/14/17 1938    Maia Plan, MD 05/15/17 [phone]

## 2017-05-14 NOTE — ED Triage Notes (Signed)
Ongoing vaginal discharge since July when she was dx with trich.

## 2017-05-14 NOTE — Discharge Instructions (Signed)
Please read and follow all provided instructions.  Your diagnoses today include:  1. BV (bacterial vaginosis)     Tests performed today include:  Test for gonorrhea and chlamydia.   Test for HIV and syphilis.   You will be notified by telephone with any positive results.   Vital signs. See below for your results today.   Home care instructions:  Read educational materials contained in this packet and follow any instructions provided.   Return instructions:   Please return to the Emergency Department if you experience worsening symptoms.   Please return if you have any other emergent concerns.  Additional Information:  Your vital signs today were: BP 133/85 (BP Location: Right Arm)    Pulse 74    Temp 98.2 F (36.8 C) (Oral)    Resp 16    Ht  (1.575 m)    Wt 74.8 kg (165 lb)    LMP 04/19/2017    SpO2 100%    BMI 30.18 kg/m  If your blood pressure (BP) was elevated above 135/85 this visit, please have this repeated by your doctor within one month. --------------

## 2017-05-15 LAB — GC/CHLAMYDIA PROBE AMP (~~LOC~~) NOT AT ARMC
Chlamydia: NEGATIVE
Neisseria Gonorrhea: NEGATIVE

## 2017-05-16 LAB — RPR: RPR Ser Ql: NONREACTIVE

## 2017-05-16 LAB — HIV ANTIBODY (ROUTINE TESTING W REFLEX): HIV Screen 4th Generation wRfx: NONREACTIVE

## 2017-05-16 NOTE — ED Notes (Signed)
Pt. Called for STD results,  Results given, pt. Verbalized understanding

## 2017-05-24 ENCOUNTER — Inpatient Hospital Stay (HOSPITAL_COMMUNITY)
Admission: AD | Admit: 2017-05-24 | Discharge: 2017-05-24 | Disposition: A | Discharge status: Home / Self Care | Payer: Medicaid Other | Source: Ambulatory Visit | Attending: Obstetrics and Gynecology | Admitting: Obstetrics and Gynecology

## 2017-05-24 DIAGNOSIS — N939 Abnormal uterine and vaginal bleeding, unspecified: Secondary | ICD-10-CM | POA: Diagnosis not present

## 2017-05-24 DIAGNOSIS — Z3202 Encounter for pregnancy test, result negative: Secondary | ICD-10-CM | POA: Diagnosis not present

## 2017-05-24 DIAGNOSIS — R030 Elevated blood-pressure reading, without diagnosis of hypertension: Secondary | ICD-10-CM | POA: Insufficient documentation

## 2017-05-24 LAB — URINALYSIS, ROUTINE W REFLEX MICROSCOPIC
Bilirubin Urine: NEGATIVE
Glucose, UA: NEGATIVE mg/dL
Hgb urine dipstick: NEGATIVE
Ketones, ur: NEGATIVE mg/dL
Leukocytes, UA: NEGATIVE
Nitrite: NEGATIVE
Protein, ur: NEGATIVE mg/dL
Specific Gravity, Urine: 1.009 (ref 1.005–1.030)
pH: 5 (ref 5.0–8.0)

## 2017-05-24 LAB — HCG, SERUM, QUALITATIVE: Preg, Serum: NEGATIVE

## 2017-05-24 LAB — POCT PREGNANCY, URINE: Preg Test, Ur: NEGATIVE

## 2017-05-24 MED ORDER — PRENATAL PLUS 27-1 MG PO TABS: 1.0000 | ORAL_TABLET | Freq: Every day | ORAL | 0 refills | Status: DC

## 2017-05-24 NOTE — MAU Note (Signed)
Pt had a period on 05/19/17 that only lasted 2 days. (was on time) Took a HPT and it had a faint line. C/o breast pain. Denies any abd pain , cramping or vaginal bleeding.

## 2017-05-24 NOTE — MAU Provider Note (Signed)
History     CSN: [id number]  Arrival date and time: 05/24/17 1709   First Provider Initiated Contact with Patient 05/24/17 2009      Chief Complaint  Patient presents with  . Vaginal Bleeding   Shelly Gibbs is a 31 y.o. [id number] at presenting for pregnancy confirmation. She states her HPT was faintly positive. She's also had some breast tenderness and nausea. LMP was at expected time 05/19/2017 but short duration of 2 days. Not using contraception and does not want to start. No other concerns. Denies irritative vaginal discharge, abdominal pain, new sexual partner. Declines STI testing. No known  hypertension diagnosis and aware of BP elevation only once at last visit to Clinic. Chart review shows consistent prior BP elevations.     OB History  Gravida Para Term Preterm AB Living  0 5 2  SAB TAB Ectopic Multiple Live Births  1 4 0 0 1    # Outcome Date GA Lbr Len/2nd Weight Sex Delivery Anes PTL Lv  8 Gravida           7 Term 12/20/13 [redacted]w[redacted]d 07:42 / 00:11 5 lb 13.1 oz (2.64 kg) F Vag-Spont EPI, Local  LIV     Birth Comments: none  6 TAB           5 TAB           4 TAB           3 SAB           2 TAB  [redacted]w[redacted]d         1 Term      Vag-Spont          Past Medical History:  Diagnosis Date  . Asthma    inhaler last used "long time ago"  . BV (bacterial vaginosis)   . Chlamydia   . Gonorrhea 08-03-2012  . Headache(784.0)   . Trichomonas infection   . UTI (lower urinary tract infection)   . Yeast infection     Past Surgical History:  Procedure Laterality Date  . DILATION AND CURETTAGE OF UTERUS    . INDUCED ABORTION     x 2    Family History  Problem Relation Age of Onset  . Diabetes Maternal Aunt   . Anesthesia problems Neg Hx   . Other Neg Hx   . Hearing loss Neg Hx     Social History  Substance Use Topics  . Smoking status: Never Smoker  . Smokeless tobacco: Never Used  . Alcohol use Yes     Comment: occasional     Allergies:  Allergies   Allergen Reactions  . Latex Itching    Burning   . Percocet [Oxycodone-Acetaminophen] Hives  . Tylenol [Acetaminophen] Hives    Prescriptions Prior to Admission  Medication Sig Dispense Refill Last Dose  . clindamycin (CLEOCIN) 150 MG capsule Take 2 capsules (300 mg total) by mouth 2 (two) times daily. 28 capsule 0     Review of Systems  Constitutional: Negative for fatigue and fever.  Gastrointestinal: Positive for nausea. Negative for abdominal pain and vomiting.  Genitourinary: Negative for dysuria, vaginal discharge and vaginal pain.  Neurological: Negative for light-headedness.   Physical Exam   Blood pressure (!) 134/97, pulse 77, temperature 98 F (36.7 C), temperature source Oral, resp. rate 18, height  (1.575 m), weight 175 lb (79.4 kg), last menstrual period 05/19/2017, unknown if currently breastfeeding. Vitals:   05/24/17 1756 05/24/17 2044  BP: (!) 134/97 136/89  Pulse: 77 (!) 57  Resp: 18   Temp: 98 F (36.7 C)   TempSrc: Oral   Weight: 175 lb (79.4 kg)   Height:  (1.575 m)    Physical Exam  Nursing note and vitals reviewed. Constitutional: She is oriented to person, place, and time. She appears well-developed and well-nourished. No distress.  HENT:  Head: Normocephalic.  Eyes: No scleral icterus.  Neck: Neck supple.  Cardiovascular: Normal rate.   Respiratory: Effort normal.  GI: Soft. There is no tenderness.  Neurological: She is alert and oriented to person, place, and time.  Skin: Skin is warm and dry.  Psychiatric: She has a normal mood and affect. Her behavior is normal.    MAU Course  Procedures Results for orders placed or performed during the hospital encounter of 05/24/17 (from the past 24 hour(s))  Urinalysis, Routine w reflex microscopic     Status: Abnormal   Collection Time: 05/24/17  6:00 PM  Result Value Ref Range   Color, Urine STRAW (A) YELLOW   APPearance CLEAR CLEAR   Specific Gravity, Urine 1.009 1.005 - 1.030    pH 5.0 5.0 - 8.0   Glucose, UA NEGATIVE NEGATIVE mg/dL   Hgb urine dipstick NEGATIVE NEGATIVE   Bilirubin Urine NEGATIVE NEGATIVE   Ketones, ur NEGATIVE NEGATIVE mg/dL   Protein, ur NEGATIVE NEGATIVE mg/dL   Nitrite NEGATIVE NEGATIVE   Leukocytes, UA NEGATIVE NEGATIVE  Pregnancy, urine POC     Status: None   Collection Time: 05/24/17  6:19 PM  Result Value Ref Range   Preg Test, Ur NEGATIVE NEGATIVE  hCG, serum, qualitative     Status: None   Collection Time: 05/24/17  6:48 PM  Result Value Ref Range   Preg, Serum NEGATIVE NEGATIVE   Counseled at length:  Advised of contraception options and concerns with multiple TABs.  Will prescribe PNV Since she intends to continue having unprotected intercourse. Dressed the need to see PCP regarding  BP elevations list of providers given   Assessment and Plan   1. Elevated BP without diagnosis of hypertension    Pregnancy ruled out  Allergies as of 05/24/2017      Reactions   Latex Itching   Burning   Percocet [oxycodone-acetaminophen] Hives   Tylenol [acetaminophen] Hives      Medication List    STOP taking these medications   clindamycin 150 MG capsule Commonly known as:  CLEOCIN     TAKE these medications   prenatal vitamin w/FE, FA 27-1 MG Tabs tablet Take 1 tablet by mouth daily.        Vercie Pokorny CNM 05/24/2017, 8:10 PM

## 2017-05-24 NOTE — Discharge Instructions (Signed)
Contraception Choices Contraception (birth control) is the use of any methods or devices to prevent pregnancy. Below are some methods to help avoid pregnancy. Hormonal methods  Contraceptive implant. This is a thin, plastic tube containing progesterone hormone. It does not contain estrogen hormone. Your health care provider inserts the tube in the inner part of the upper arm. The tube can remain in place for up to 3 years. After 3 years, the implant must be removed. The implant prevents the ovaries from releasing an egg (ovulation), thickens the cervical mucus to prevent sperm from entering the uterus, and thins the lining of the inside of the uterus.  Progesterone-only injections. These injections are given every 3 months by your health care provider to prevent pregnancy. This synthetic progesterone hormone stops the ovaries from releasing eggs. It also thickens cervical mucus and changes the uterine lining. This makes it harder for sperm to survive in the uterus.  Birth control pills. These pills contain estrogen and progesterone hormone. They work by preventing the ovaries from releasing eggs (ovulation). They also cause the cervical mucus to thicken, preventing the sperm from entering the uterus. Birth control pills are prescribed by a health care provider.Birth control pills can also be used to treat heavy periods.  Minipill. This type of birth control pill contains only the progesterone hormone. They are taken every day of each month and must be prescribed by your health care provider.  Birth control patch. The patch contains hormones similar to those in birth control pills. It must be changed once a week and is prescribed by a health care provider.  Vaginal ring. The ring contains hormones similar to those in birth control pills. It is left in the vagina for 3 weeks, removed for 1 week, and then a new one is put back in place. The patient must be comfortable inserting and removing the ring from  the vagina.A health care provider's prescription is necessary.  Emergency contraception. Emergency contraceptives prevent pregnancy after unprotected sexual intercourse. This pill can be taken right after sex or up to 5 days after unprotected sex. It is most effective the sooner you take the pills after having sexual intercourse. Most emergency contraceptive pills are available without a prescription. Check with your pharmacist. Do not use emergency contraception as your only form of birth control. Barrier methods  Female condom. This is a thin sheath (latex or rubber) that is worn over the penis during sexual intercourse. It can be used with spermicide to increase effectiveness.  Female condom. This is a soft, loose-fitting sheath that is put into the vagina before sexual intercourse.  Diaphragm. This is a soft, latex, dome-shaped barrier that must be fitted by a health care provider. It is inserted into the vagina, along with a spermicidal jelly. It is inserted before intercourse. The diaphragm should be left in the vagina for 6 to 8 hours after intercourse.  Cervical cap. This is a round, soft, latex or plastic cup that fits over the cervix and must be fitted by a health care provider. The cap can be left in place for up to 48 hours after intercourse.  Sponge. This is a soft, circular piece of polyurethane foam. The sponge has spermicide in it. It is inserted into the vagina after wetting it and before sexual intercourse.  Spermicides. These are chemicals that kill or block sperm from entering the cervix and uterus. They come in the form of creams, jellies, suppositories, foam, or tablets. They do not require a prescription. They  are inserted into the vagina with an applicator before having sexual intercourse. The process must be repeated every time you have sexual intercourse. Intrauterine contraception  Intrauterine device (IUD). This is a T-shaped device that is put in a woman's uterus during  a menstrual period to prevent pregnancy. There are 2 types: ? Copper IUD. This type of IUD is wrapped in copper wire and is placed inside the uterus. Copper makes the uterus and fallopian tubes produce a fluid that kills sperm. It can stay in place for 10 years. ? Hormone IUD. This type of IUD contains the hormone progestin (synthetic progesterone). The hormone thickens the cervical mucus and prevents sperm from entering the uterus, and it also thins the uterine lining to prevent implantation of a fertilized egg. The hormone can weaken or kill the sperm that get into the uterus. It can stay in place for 3-5 years, depending on which type of IUD is used. Permanent methods of contraception  Female tubal ligation. This is when the woman's fallopian tubes are surgically sealed, tied, or blocked to prevent the egg from traveling to the uterus.  Hysteroscopic sterilization. This involves placing a small coil or insert into each fallopian tube. Your doctor uses a technique called hysteroscopy to do the procedure. The device causes scar tissue to form. This results in permanent blockage of the fallopian tubes, so the sperm cannot fertilize the egg. It takes about 3 months after the procedure for the tubes to become blocked. You must use another form of birth control for these 3 months.  Female sterilization. This is when the female has the tubes that carry sperm tied off (vasectomy).This blocks sperm from entering the vagina during sexual intercourse. After the procedure, the man can still ejaculate fluid (semen). Natural planning methods  Natural family planning. This is not having sexual intercourse or using a barrier method (condom, diaphragm, cervical cap) on days the woman could become pregnant.  Calendar method. This is keeping track of the length of each menstrual cycle and identifying when you are fertile.  Ovulation method. This is avoiding sexual intercourse during ovulation.  Symptothermal method.  This is avoiding sexual intercourse during ovulation, using a thermometer and ovulation symptoms.  Post-ovulation method. This is timing sexual intercourse after you have ovulated. Regardless of which type or method of contraception you choose, it is important that you use condoms to protect against the transmission of sexually transmitted infections (STIs). Talk with your health care provider about which form of contraception is most appropriate for you. This information is not intended to replace advice given to you by your health care provider. Make sure you discuss any questions you have with your health care provider. Document Released: 07/29/2005 Document Revised: 01/04/2016 Document Reviewed: 01/21/2013 Elsevier Interactive Patient Education  2017 [employer] Hypertension Hypertension is another name for high blood pressure. High blood pressure forces your heart to work harder to pump blood. This can cause problems over time. There are two numbers in a blood pressure reading. There is a top number (systolic) over a bottom number (diastolic). It is best to have a blood pressure below 120/80. Healthy choices can help lower your blood pressure. You may need medicine to help lower your blood pressure if:  Your blood pressure cannot be lowered with healthy choices.  Your blood pressure is higher than 130/80.  Follow these instructions at home: Eating and drinking  If directed, follow the DASH eating plan. This diet includes: ? Filling half of your plate at  each meal with fruits and vegetables. ? Filling one quarter of your plate at each meal with whole grains. Whole grains include whole wheat pasta, brown rice, and whole grain bread. ? Eating or drinking low-fat dairy products, such as skim milk or low-fat yogurt. ? Filling one quarter of your plate at each meal with low-fat (lean) proteins. Low-fat proteins include fish, skinless chicken, eggs, beans, and tofu. ? Avoiding fatty meat,  cured and processed meat, or chicken with skin. ? Avoiding premade or processed food.  Eat less than 1,500 mg of salt (sodium) a day.  Limit alcohol use to no more than 1 drink a day for nonpregnant women and 2 drinks a day for men. One drink equals 12 oz of beer, 5 oz of wine, or 1 oz of hard liquor. Lifestyle  Work with your doctor to stay at a healthy weight or to lose weight. Ask your doctor what the best weight is for you.  Get at least 30 minutes of exercise that causes your heart to beat faster (aerobic exercise) most days of the week. This may include walking, swimming, or biking.  Get at least 30 minutes of exercise that strengthens your muscles (resistance exercise) at least 3 days a week. This may include lifting weights or pilates.  Do not use any products that contain nicotine or tobacco. This includes cigarettes and e-cigarettes. If you need help quitting, ask your doctor.  Check your blood pressure at home as told by your doctor.  Keep all follow-up visits as told by your doctor. This is important. Medicines  Take over-the-counter and prescription medicines only as told by your doctor. Follow directions carefully.  Do not skip doses of blood pressure medicine. The medicine does not work as well if you skip doses. Skipping doses also puts you at risk for problems.  Ask your doctor about side effects or reactions to medicines that you should watch for. Contact a doctor if:  You think you are having a reaction to the medicine you are taking.  You have headaches that keep coming back (recurring).  You feel dizzy.  You have swelling in your ankles.  You have trouble with your vision. Get help right away if:  You get a very bad headache.  You start to feel confused.  You feel weak or numb.  You feel faint.  You get very bad pain in your: ? Chest. ? Belly (abdomen).  You throw up (vomit) more than once.  You have trouble  breathing. Summary  Hypertension is another name for high blood pressure.  Making healthy choices can help lower blood pressure. If your blood pressure cannot be controlled with healthy choices, you may need to take medicine. This information is not intended to replace advice given to you by your health care provider. Make sure you discuss any questions you have with your health care provider. Document Released: 01/15/2008 Document Revised: 06/26/2016 Document Reviewed: 06/26/2016 Elsevier Interactive Patient Education  [employer].

## 2017-06-26 ENCOUNTER — Other Ambulatory Visit (HOSPITAL_COMMUNITY): Payer: Self-pay | Admitting: *Deleted

## 2017-08-26 ENCOUNTER — Encounter (HOSPITAL_BASED_OUTPATIENT_CLINIC_OR_DEPARTMENT_OTHER): Payer: Self-pay | Admitting: *Deleted

## 2017-08-26 ENCOUNTER — Emergency Department (HOSPITAL_BASED_OUTPATIENT_CLINIC_OR_DEPARTMENT_OTHER)
Admission: EM | Admit: 2017-08-26 | Discharge: 2017-08-26 | Disposition: A | Discharge status: Home / Self Care | Payer: Medicaid Other | Attending: Emergency Medicine | Admitting: Emergency Medicine

## 2017-08-26 ENCOUNTER — Other Ambulatory Visit: Payer: Self-pay

## 2017-08-26 DIAGNOSIS — N898 Other specified noninflammatory disorders of vagina: Secondary | ICD-10-CM | POA: Diagnosis present

## 2017-08-26 DIAGNOSIS — A5901 Trichomonal vulvovaginitis: Secondary | ICD-10-CM | POA: Insufficient documentation

## 2017-08-26 DIAGNOSIS — Z91040 Latex allergy status: Secondary | ICD-10-CM | POA: Insufficient documentation

## 2017-08-26 DIAGNOSIS — N72 Inflammatory disease of cervix uteri: Secondary | ICD-10-CM | POA: Insufficient documentation

## 2017-08-26 DIAGNOSIS — J45909 Unspecified asthma, uncomplicated: Secondary | ICD-10-CM | POA: Insufficient documentation

## 2017-08-26 LAB — WET PREP, GENITAL
Sperm: NONE SEEN
Yeast Wet Prep HPF POC: NONE SEEN

## 2017-08-26 LAB — URINALYSIS, ROUTINE W REFLEX MICROSCOPIC
Bilirubin Urine: NEGATIVE
Glucose, UA: NEGATIVE mg/dL
Hgb urine dipstick: NEGATIVE
Ketones, ur: NEGATIVE mg/dL
Nitrite: NEGATIVE
Protein, ur: NEGATIVE mg/dL
Specific Gravity, Urine: 1.025 (ref 1.005–1.030)
pH: 6 (ref 5.0–8.0)

## 2017-08-26 LAB — URINALYSIS, MICROSCOPIC (REFLEX)

## 2017-08-26 LAB — PREGNANCY, URINE: Preg Test, Ur: NEGATIVE

## 2017-08-26 MED ORDER — AZITHROMYCIN 250 MG PO TABS
1000.0000 mg | ORAL_TABLET | Freq: Once | ORAL | Status: AC
  Administered 2017-08-26: 1000 mg via ORAL

## 2017-08-26 MED ORDER — ONDANSETRON 8 MG PO TBDP
8.0000 mg | ORAL_TABLET | Freq: Once | ORAL | Status: AC
  Administered 2017-08-26: 8 mg via ORAL
  Filled 2017-08-26: qty 1

## 2017-08-26 MED ORDER — AZITHROMYCIN 250 MG PO TABS
ORAL_TABLET | ORAL | Status: AC
  Administered 2017-08-26: 1000 mg via ORAL
  Filled 2017-08-26: qty 4

## 2017-08-26 MED ORDER — METRONIDAZOLE 500 MG PO TABS: 500.0000 mg | ORAL_TABLET | Freq: Two times a day (BID) | ORAL | 0 refills | Status: DC

## 2017-08-26 MED ORDER — CEFTRIAXONE SODIUM 250 MG IJ SOLR
250.0000 mg | Freq: Once | INTRAMUSCULAR | Status: AC
  Administered 2017-08-26: 250 mg via INTRAMUSCULAR
  Filled 2017-08-26: qty 250

## 2017-08-26 MED FILL — metroNIDAZOLE 500 MG TABS: 500 | 7 days supply | Qty: 14 | Fill #0

## 2017-08-26 NOTE — ED Triage Notes (Signed)
Vaginal discharge and odor for 6 weeks.

## 2017-08-26 NOTE — ED Provider Notes (Signed)
[facility] EMERGENCY DEPARTMENT Provider Note   CSN: [id number] Arrival date & time: 08/26/17  1101     History   Chief Complaint Chief Complaint  Patient presents with  . Vaginal Discharge    HPI Shelly Gibbs is a 32 y.o. female.  33 year old female with past medical history below who presents with vaginal discharge.  She states that she has had about a week and a half of foul-smelling vaginal discharge.  She denies any associated pain.  No vomiting, fevers, or recent illness.  She is sexually active with one partner, does not use protection.   The history is provided by the patient.  Vaginal Discharge      Past Medical History:  Diagnosis Date  . Asthma    inhaler last used "long time ago"  . BV (bacterial vaginosis)   . Chlamydia   . Gonorrhea 08-03-2012  . Headache(784.0)   . Trichomonas infection   . UTI (lower urinary tract infection)   . Yeast infection     Patient Active Problem List   Diagnosis Date Noted  . Visit for wound check 10/28/2016  . Indication for care in labor or delivery 12/20/2013  . Pregnancy 12/07/2013  . Bacterial vaginosis 07/30/2012  . Impetigo 07/30/2012    Past Surgical History:  Procedure Laterality Date  . DILATION AND CURETTAGE OF UTERUS    . INDUCED ABORTION     x 2    OB History    Gravida Para Term Preterm AB Living   8 2 2  0 5 2   SAB TAB Ectopic Multiple Live Births   1 4 0 0 1       Home Medications    Prior to Admission medications   Medication Sig Start Date End Date Taking? Authorizing Provider  metroNIDAZOLE (FLAGYL) 500 MG tablet Take 1 tablet (500 mg total) by mouth 2 (two) times daily. 08/26/17   Koleman Marling, Ambrose Finland, MD  prenatal vitamin w/FE, FA (PRENATAL 1 + 1) 27-1 MG TABS tablet Take 1 tablet by mouth daily. 05/24/17   Poe, Deirdre Salena Saner, CNM    Family History Family History  Problem Relation Age of Onset  . Diabetes Maternal Aunt   . Anesthesia problems Neg Hx   . Other Neg Hx    . Hearing loss Neg Hx     Social History Social History   Tobacco Use  . Smoking status: Never Smoker  . Smokeless tobacco: Never Used  Substance Use Topics  . Alcohol use: Yes    Comment: occasional   . Drug use: No     Allergies   Latex; Percocet [oxycodone-acetaminophen]; and Tylenol [acetaminophen]   Review of Systems Review of Systems  Genitourinary: Positive for vaginal discharge.   All other systems reviewed and are negative except that which was mentioned in HPI  Physical Exam Updated Vital Signs BP 117/88 (BP Location: Left Arm)   Pulse 92   Temp 98.9 F (37.2 C) (Oral)   Resp 18   Ht 5\' 2"  (1.575 m)   Wt 81.6 kg (180 lb)   LMP 08/07/2017   SpO2 99%   BMI 32.92 kg/m   Physical Exam  Constitutional: She is oriented to person, place, and time. She appears well-developed and well-nourished. No distress.  HENT:  Head: Normocephalic and atraumatic.  Eyes: Conjunctivae are normal.  Neck: Neck supple.  Cardiovascular: Normal rate, regular rhythm and normal heart sounds.  No murmur heard. Pulmonary/Chest: Effort normal and breath sounds normal.  Abdominal: Soft. Bowel sounds are normal. She exhibits no distension. There is no tenderness.  Genitourinary: Uterus is not tender. Cervix exhibits discharge and friability. Cervix exhibits no motion tenderness. Right adnexum displays no tenderness. Left adnexum displays no tenderness. Vaginal discharge found.    Genitourinary Comments: Friable, erythematous central cervix with 1 small white plaque at 2-3 o'clock position  Musculoskeletal: She exhibits no edema.  Neurological: She is alert and oriented to person, place, and time.  Fluent speech  Skin: Skin is warm and dry.  Psychiatric: She has a normal mood and affect. Judgment normal.  Nursing note and vitals reviewed. Chaperone was present during exam.    ED Treatments / Results  Labs (all labs ordered are listed, but only abnormal results are  displayed) Labs Reviewed  WET PREP, GENITAL - Abnormal; Notable for the following components:      Result Value   Trich, Wet Prep PRESENT (*)    Clue Cells Wet Prep HPF POC PRESENT (*)    WBC, Wet Prep HPF POC MANY (*)    All other components within normal limits  URINALYSIS, ROUTINE W REFLEX MICROSCOPIC - Abnormal; Notable for the following components:   Leukocytes, UA SMALL (*)    All other components within normal limits  URINALYSIS, MICROSCOPIC (REFLEX) - Abnormal; Notable for the following components:   Bacteria, UA FEW (*)    Squamous Epithelial / LPF 6-30 (*)    All other components within normal limits  PREGNANCY, URINE  GC/CHLAMYDIA PROBE AMP [facility]    EKG  EKG Interpretation None       Radiology No results found.  Procedures Procedures (including critical care time)  Medications Ordered in ED Medications  azithromycin (ZITHROMAX) tablet 1,000 mg (not administered)  ondansetron (ZOFRAN-ODT) disintegrating tablet 8 mg (8 mg Oral Given 08/26/17 1208)  cefTRIAXone (ROCEPHIN) injection 250 mg (250 mg Intramuscular Given 08/26/17 1208)     Initial Impression / Assessment and Plan / ED Course  I have reviewed the triage vital signs and the nursing notes.  Pertinent labs that were available during my care of the patient were reviewed by me and considered in my medical decision making (see chart for details).    Vaginal discharge and friable cervix on exam, no CMT or adnexal tenderness to suggest PID or TOA. She did have a small white lesion on cervix which I explained could be related to infection but needs OB/GYN follow-up for Pap smear and further testing.  Wet prep positive for Trichomonas.  Gave ceftriaxone and azithromycin to cover for GC and chlamydia as well as a course of Flagyl.  Recommended following up with the women's clinic where she has been seen previously.  Extensively reviewed return precautions including severe abdominal pain,  vomiting, fevers, or worsening symptoms.  She voiced understanding and was discharged in satisfactory condition.  Final Clinical Impressions(s) / ED Diagnoses   Final diagnoses:  Cervicitis  Trichomonal vaginitis    ED Discharge Orders        Ordered    metroNIDAZOLE (FLAGYL) 500 MG tablet  2 times daily     08/26/17 1238       Shakeita Vandevander, Ambrose Finlandachel Morgan, MD 08/26/17 1247

## 2017-08-27 LAB — GC/CHLAMYDIA PROBE AMP (~~LOC~~) NOT AT ARMC
Chlamydia: NEGATIVE
Neisseria Gonorrhea: NEGATIVE

## 2017-12-29 ENCOUNTER — Encounter (HOSPITAL_BASED_OUTPATIENT_CLINIC_OR_DEPARTMENT_OTHER): Payer: Self-pay | Admitting: *Deleted

## 2017-12-29 ENCOUNTER — Other Ambulatory Visit: Payer: Self-pay

## 2017-12-29 ENCOUNTER — Emergency Department (HOSPITAL_BASED_OUTPATIENT_CLINIC_OR_DEPARTMENT_OTHER)
Admission: EM | Admit: 2017-12-29 | Discharge: 2017-12-29 | Disposition: A | Discharge status: Home / Self Care | Payer: Medicaid Other | Attending: Physician Assistant | Admitting: Physician Assistant

## 2017-12-29 DIAGNOSIS — L03116 Cellulitis of left lower limb: Secondary | ICD-10-CM | POA: Diagnosis not present

## 2017-12-29 DIAGNOSIS — J45909 Unspecified asthma, uncomplicated: Secondary | ICD-10-CM | POA: Diagnosis not present

## 2017-12-29 DIAGNOSIS — Z91040 Latex allergy status: Secondary | ICD-10-CM | POA: Insufficient documentation

## 2017-12-29 DIAGNOSIS — Z79899 Other long term (current) drug therapy: Secondary | ICD-10-CM | POA: Diagnosis not present

## 2017-12-29 DIAGNOSIS — R600 Localized edema: Secondary | ICD-10-CM | POA: Diagnosis present

## 2017-12-29 MED ORDER — CEPHALEXIN 250 MG PO CAPS
500.0000 mg | ORAL_CAPSULE | Freq: Once | ORAL | Status: AC
  Administered 2017-12-29: 500 mg via ORAL
  Filled 2017-12-29: qty 2

## 2017-12-29 MED ORDER — CEPHALEXIN 500 MG PO CAPS: 500.0000 mg | ORAL_CAPSULE | Freq: Four times a day (QID) | ORAL | 0 refills | Status: DC

## 2017-12-29 MED FILL — CEPHALEXIN 500 MG CAPSULE: 500 | 10 days supply | Qty: 40 | Fill #0

## 2017-12-29 NOTE — Discharge Instructions (Signed)
It is unclear whether this is a bug bite and a reaction of your skin to the bug bite, or you have a little bit of overlying infection.  We will treat you with antibiotics just in case.  You should use Benadryl as well.  Keep it elevated.  You may use ice for symptoms.

## 2017-12-29 NOTE — ED Provider Notes (Signed)
[facility] EMERGENCY DEPARTMENT Provider Note   CSN: [id number] Arrival date & time: 12/29/17  1049     History   Chief Complaint Chief Complaint  Patient presents with  . Insect Bite    HPI Shelly Gibbs is a 32 y.o. female.  HPI  Patient is a 32 year old female presenting with redness and swelling to the left ankle.  Patient believes that she was bit by something on Saturday night.  She reports that the swelling and erythema is gotten worse since then.  She has had mild itching as well.    Past Medical History:  Diagnosis Date  . Asthma    inhaler last used "long time ago"  . BV (bacterial vaginosis)   . Chlamydia   . Gonorrhea 08-03-2012  . Headache(784.0)   . Trichomonas infection   . UTI (lower urinary tract infection)   . Yeast infection     Patient Active Problem List   Diagnosis Date Noted  . Visit for wound check 10/28/2016  . Indication for care in labor or delivery 12/20/2013  . Pregnancy 12/07/2013  . Bacterial vaginosis 07/30/2012  . Impetigo 07/30/2012    Past Surgical History:  Procedure Laterality Date  . DILATION AND CURETTAGE OF UTERUS    . INDUCED ABORTION     x 2     OB History    Gravida  8   Para  2   Term  2   Preterm  0   AB  5   Living  2     SAB  1   TAB  4   Ectopic  0   Multiple  0   Live Births  1            Home Medications    Prior to Admission medications   Medication Sig Start Date End Date Taking? Authorizing Provider  metroNIDAZOLE (FLAGYL) 500 MG tablet Take 1 tablet (500 mg total) by mouth 2 (two) times daily. 08/26/17   Little, Ambrose Finland, MD  prenatal vitamin w/FE, FA (PRENATAL 1 + 1) 27-1 MG TABS tablet Take 1 tablet by mouth daily. 05/24/17   Poe, Deirdre Salena Saner, CNM    Family History Family History  Problem Relation Age of Onset  . Diabetes Maternal Aunt   . Anesthesia problems Neg Hx   . Other Neg Hx   . Hearing loss Neg Hx     Social History Social History    Tobacco Use  . Smoking status: Never Smoker  . Smokeless tobacco: Never Used  Substance Use Topics  . Alcohol use: Yes    Comment: occasional   . Drug use: No     Allergies   Latex; Percocet [oxycodone-acetaminophen]; and Tylenol [acetaminophen]   Review of Systems Review of Systems  Constitutional: Negative for activity change.  Respiratory: Negative for shortness of breath.   Cardiovascular: Negative for chest pain.  Gastrointestinal: Negative for abdominal pain.  All other systems reviewed and are negative.    Physical Exam Updated Vital Signs BP (!) 119/92   Pulse 65   Temp 98.2 F (36.8 C) (Oral)   Resp 20   Ht  (1.575 m)   Wt 81.6 kg (180 lb)   LMP 12/24/2017   SpO2 100%   BMI 32.92 kg/m   Physical Exam  Constitutional: She is oriented to person, place, and time. She appears well-developed and well-nourished.  HENT:  Head: Normocephalic and atraumatic.  Eyes: Conjunctivae are normal. Right eye exhibits  no discharge.  Neck: Neck supple.  Pulmonary/Chest: Effort normal.  Musculoskeletal: Normal range of motion. She exhibits no edema.  Mild erythema to the left ankle and shin.  No bite seen.  Patient reports itching.  Patient is able to range ankle without significant pain.  Neurological: She is oriented to person, place, and time. No cranial nerve deficit.  Skin: Skin is warm and dry. No rash noted. She is not diaphoretic.  Psychiatric: She has a normal mood and affect. Her behavior is normal.  Nursing note and vitals reviewed.    ED Treatments / Results  Labs (all labs ordered are listed, but only abnormal results are displayed) Labs Reviewed - No data to display  EKG None  Radiology No results found.  Procedures Procedures (including critical care time)  Medications Ordered in ED Medications  cephALEXin (KEFLEX) capsule 500 mg (has no administration in time range)     Initial Impression / Assessment and Plan / ED Course  I have  reviewed the triage vital signs and the nursing notes.  Pertinent labs & imaging results that were available during my care of the patient were reviewed by me and considered in my medical decision making (see chart for details).    Patient is a 32 year old female presenting with redness and swelling to the left ankle.  Patient believes that she was bit by something on Saturday night.  She reports that the swelling and erythema is gotten worse since then.  She has had mild itching as well.  12:01 PM Will trace around erythema.  Will treat for early cellulitis.  Suspect this is local reaction to bite.  We will have her use Benadryl, antibiotic at home.  Return precautions discussed.   Final Clinical Impressions(s) / ED Diagnoses   Final diagnoses:  None    ED Discharge Orders    None       Abelino Derrick, MD 12/29/17 1202

## 2017-12-29 NOTE — ED Notes (Signed)
Patients cellulitis marked and dated

## 2017-12-29 NOTE — ED Triage Notes (Signed)
Insect bite 2 days ago. Swelling, redness, pain and itching since.

## 2018-01-27 ENCOUNTER — Ambulatory Visit: Payer: Medicaid Other

## 2018-03-04 ENCOUNTER — Other Ambulatory Visit: Payer: Self-pay

## 2018-03-04 ENCOUNTER — Encounter (HOSPITAL_BASED_OUTPATIENT_CLINIC_OR_DEPARTMENT_OTHER): Payer: Self-pay

## 2018-03-04 ENCOUNTER — Emergency Department (HOSPITAL_BASED_OUTPATIENT_CLINIC_OR_DEPARTMENT_OTHER)
Admission: EM | Admit: 2018-03-04 | Discharge: 2018-03-04 | Disposition: A | Discharge status: Home / Self Care | Payer: Medicaid Other | Attending: Emergency Medicine | Admitting: Emergency Medicine

## 2018-03-04 DIAGNOSIS — N760 Acute vaginitis: Secondary | ICD-10-CM | POA: Insufficient documentation

## 2018-03-04 DIAGNOSIS — B9689 Other specified bacterial agents as the cause of diseases classified elsewhere: Secondary | ICD-10-CM

## 2018-03-04 DIAGNOSIS — J45909 Unspecified asthma, uncomplicated: Secondary | ICD-10-CM | POA: Diagnosis not present

## 2018-03-04 DIAGNOSIS — Z79899 Other long term (current) drug therapy: Secondary | ICD-10-CM | POA: Insufficient documentation

## 2018-03-04 DIAGNOSIS — Z711 Person with feared health complaint in whom no diagnosis is made: Secondary | ICD-10-CM | POA: Insufficient documentation

## 2018-03-04 DIAGNOSIS — N898 Other specified noninflammatory disorders of vagina: Secondary | ICD-10-CM | POA: Diagnosis present

## 2018-03-04 LAB — WET PREP, GENITAL
Sperm: NONE SEEN
Trich, Wet Prep: NONE SEEN
Yeast Wet Prep HPF POC: NONE SEEN

## 2018-03-04 LAB — URINALYSIS, COMPLETE (UACMP) WITH MICROSCOPIC
Bilirubin Urine: NEGATIVE
Glucose, UA: NEGATIVE mg/dL
Hgb urine dipstick: NEGATIVE
Ketones, ur: NEGATIVE mg/dL
Leukocytes, UA: NEGATIVE
Nitrite: NEGATIVE
Protein, ur: NEGATIVE mg/dL
Specific Gravity, Urine: 1.025 (ref 1.005–1.030)
pH: 6 (ref 5.0–8.0)

## 2018-03-04 LAB — PREGNANCY, URINE: Preg Test, Ur: NEGATIVE

## 2018-03-04 MED ORDER — METRONIDAZOLE 500 MG PO TABS: 500.0000 mg | ORAL_TABLET | Freq: Two times a day (BID) | ORAL | 0 refills | Status: DC

## 2018-03-04 MED ORDER — AZITHROMYCIN 250 MG PO TABS
1000.0000 mg | ORAL_TABLET | Freq: Once | ORAL | Status: AC
  Administered 2018-03-04: 1000 mg via ORAL
  Filled 2018-03-04: qty 4

## 2018-03-04 MED ORDER — CEFTRIAXONE SODIUM 250 MG IJ SOLR
250.0000 mg | Freq: Once | INTRAMUSCULAR | Status: AC
  Administered 2018-03-04: 250 mg via INTRAMUSCULAR
  Filled 2018-03-04: qty 250

## 2018-03-04 MED ORDER — CLINDAMYCIN HCL 150 MG PO CAPS: 300.0000 mg | ORAL_CAPSULE | Freq: Two times a day (BID) | ORAL | 0 refills | Status: AC

## 2018-03-04 MED FILL — CLINDAMYCIN HCL 150 MG CAPS: 150 | 7 days supply | Qty: 28 | Fill #0

## 2018-03-04 NOTE — ED Triage Notes (Signed)
C/o vaginal d/c x 2 months-NAD-steady gait

## 2018-03-04 NOTE — ED Provider Notes (Signed)
[facility] Emergency Department Provider Note MRN:  [id number]  Arrival date & time: 03/04/18     Chief Complaint   Vaginal Discharge   History of Present Illness   Shelly Gibbs is a 32 y.o. year-old female with no significant past medical history presenting to the ED with chief complaint of vaginal irritation.  Patient explains that she has been having increased vaginal discharge and irritation for the past 2 to 3 days.  Symptom onset gradual, now constant.  Severity mild.  Explains that she has one sexual partner, but is unsure if her single female partner is monogamous.  Has had gonorrhea and chlamydia in the past, as well as trichomonas.  Review of Systems  A problem-focused ROS was performed. Positive for vaginal discharge.  Patient denies fever, abdominal pain, vaginal bleeding.  Patient's Health History    Past Medical History:  Diagnosis Date  . Asthma    inhaler last used "long time ago"  . BV (bacterial vaginosis)   . Chlamydia   . Gonorrhea 08-03-2012  . Headache(784.0)   . Trichomonas infection   . UTI (lower urinary tract infection)   . Yeast infection     Past Surgical History:  Procedure Laterality Date  . DILATION AND CURETTAGE OF UTERUS    . INDUCED ABORTION     x 2    Family History  Problem Relation Age of Onset  . Diabetes Maternal Aunt   . Anesthesia problems Neg Hx   . Other Neg Hx   . Hearing loss Neg Hx     Social History   Socioeconomic History  . Marital status: Single    Spouse name: Not on file  . Number of children: Not on file  . Years of education: Not on file  . Highest education level: Not on file  Occupational History  . Not on file  Social Needs  . Financial resource strain: Not on file  . Food insecurity:    Worry: Not on file    Inability: Not on file  . Transportation needs:    Medical: Not on file    Non-medical: Not on file  Tobacco Use  . Smoking status: Never Smoker  .  Smokeless tobacco: Never Used  Substance and Sexual Activity  . Alcohol use: Yes    Comment: occasional   . Drug use: No  . Sexual activity: Yes    Birth control/protection: None  Lifestyle  . Physical activity:    Days per week: Not on file    Minutes per session: Not on file  . Stress: Not on file  Relationships  . Social connections:    Talks on phone: Not on file    Gets together: Not on file    Attends religious service: Not on file    Active member of club or organization: Not on file    Attends meetings of clubs or organizations: Not on file    Relationship status: Not on file  . Intimate partner violence:    Fear of current or ex partner: Not on file    Emotionally abused: Not on file    Physically abused: Not on file    Forced sexual activity: Not on file  Other Topics Concern  . Not on file  Social History Narrative  . Not on file     Physical Exam  Vital Signs and Nursing Notes reviewed Vitals:   03/04/18 1115  BP: 126/84  Pulse: 81  Resp:  18  Temp: 98.5 F (36.9 C)  SpO2: 100%    CONSTITUTIONAL: Well-appearing, NAD NEURO:  Alert and oriented x 3, no focal deficits EYES:  eyes equal and reactive ENT/NECK:  no LAD, no JVD CARDIO: Regular rate, well-perfused, normal S1 and S2 PULM:  CTAB no wheezing or rhonchi GI/GU:  normal bowel sounds, non-distended, non-tender; normal-appearing external genitalia, no significant adnexal or cervical motion tenderness.  Small amount of thick, yellow-green discharge within the vaginal vault.  Swabs sent for laboratory evaluation, examination chaperoned by female nurse. MSK/SPINE:  No gross deformities, no edema SKIN:  no rash, atraumatic PSYCH:  Appropriate speech and behavior  Diagnostic and Interventional Summary    EKG Interpretation  Date/Time:    Ventricular Rate:    PR Interval:    QRS Duration:   QT Interval:    QTC Calculation:   R Axis:     Text Interpretation:        Labs Reviewed  WET PREP,  GENITAL - Abnormal; Notable for the following components:      Result Value   Clue Cells Wet Prep HPF POC PRESENT (*)    WBC, Wet Prep HPF POC MANY (*)    All other components within normal limits  URINALYSIS, COMPLETE (UACMP) WITH MICROSCOPIC - Abnormal; Notable for the following components:   APPearance HAZY (*)    Bacteria, UA RARE (*)    All other components within normal limits  PREGNANCY, URINE  GC/CHLAMYDIA PROBE AMP ([place]) NOT AT [facility]    No orders to display    Medications  azithromycin (ZITHROMAX) tablet 1,000 mg (1,000 mg Oral Given 03/04/18 1213)  cefTRIAXone (ROCEPHIN) injection 250 mg (250 mg Intramuscular Given 03/04/18 1214)     Procedures Critical Care  ED Course and Medical Decision Making  I have reviewed the triage vital signs and the nursing notes.  Pertinent labs & imaging results that were available during my care of the patient were reviewed by me and considered in my medical decision making (see below for details).    32 year old female otherwise healthy presenting with vaginal discharge for the past few days.  Has had STDs in the past, patient is concerned that her single sexual partner is obtaining these infections from elsewhere.  Patient is tearful on exam due to this concern.  Denies sexual assault, feel safe at home, has no other active concerns.  No fever or abdominal pain, completely nontender exam, no concern for PID abdominal exam or pelvic exam.  Last menstrual period 2 weeks ago, will test for you pregnant GC and vaginitis panel sent.  HIV and RPR testing was recommended to the patient, who defers this testing today.  Patient empirically treated for gonorrhea chlamydia with ceftriaxone and azithromycin.  Clue cells on wet prep, given prescription for clindamycin as patient does not tolerate metronidazole.  We will follow-up with her regular doctor, will return if symptoms worsen.  After the discussed management above, the patient was determined  to be safe for discharge.  The patient was in agreement with this plan and all questions regarding their care were answered.  ED return precautions were discussed and the patient will return to the ED with any significant worsening of condition.  Elmer SowMichael M. Pilar PlateBero, MD [facility] [email or web address]  Final Clinical Impressions(s) / ED Diagnoses     ICD-10-CM   1. BV (bacterial vaginosis) N76.0    B96.89   2. Vaginal discharge N89.8   3. Concern  about STD in female without diagnosis Z71.1     ED Discharge Orders        Ordered    metroNIDAZOLE (FLAGYL) 500 MG tablet  2 times daily,   Status:  Discontinued     03/04/18 1210    clindamycin (CLEOCIN) 150 MG capsule  2 times daily     03/04/18 1217         Sabas Sous, MD 03/04/18 1233

## 2018-03-04 NOTE — Discharge Instructions (Signed)
You were evaluated at the [facility] Emergency Department.  After careful evaluation, we did not find any emergent condition requiring admission or further testing in the hospital.  Your symptoms today seem to be due to bacterial vaginosis.  Please take the antibiotic prescription as directed.  We have also treated you for possible STD exposure.  You will be called with any abnormal test results.  Please return to the Emergency Department if you experience any worsening of your condition.  We encourage you to follow up with a primary care provider.  Thank you for allowing us to be a part of your care.

## 2018-03-05 LAB — GC/CHLAMYDIA PROBE AMP (~~LOC~~) NOT AT ARMC
Chlamydia: NEGATIVE
Neisseria Gonorrhea: NEGATIVE

## 2018-06-18 ENCOUNTER — Encounter (HOSPITAL_COMMUNITY): Payer: Self-pay | Admitting: *Deleted

## 2018-06-18 ENCOUNTER — Other Ambulatory Visit: Payer: Self-pay

## 2018-06-18 ENCOUNTER — Inpatient Hospital Stay (HOSPITAL_COMMUNITY)
Admission: AD | Admit: 2018-06-18 | Discharge: 2018-06-18 | Disposition: A | Discharge status: Home / Self Care | Payer: Medicaid Other | Source: Ambulatory Visit | Attending: Obstetrics and Gynecology | Admitting: Obstetrics and Gynecology

## 2018-06-18 DIAGNOSIS — N912 Amenorrhea, unspecified: Secondary | ICD-10-CM | POA: Diagnosis not present

## 2018-06-18 DIAGNOSIS — Z3201 Encounter for pregnancy test, result positive: Secondary | ICD-10-CM | POA: Diagnosis not present

## 2018-06-18 DIAGNOSIS — O219 Vomiting of pregnancy, unspecified: Secondary | ICD-10-CM

## 2018-06-18 LAB — URINALYSIS, ROUTINE W REFLEX MICROSCOPIC
Bacteria, UA: NONE SEEN
Bilirubin Urine: NEGATIVE
Glucose, UA: NEGATIVE mg/dL
Hgb urine dipstick: NEGATIVE
Ketones, ur: NEGATIVE mg/dL
Nitrite: NEGATIVE
Protein, ur: NEGATIVE mg/dL
Specific Gravity, Urine: 1.028 (ref 1.005–1.030)
pH: 5 (ref 5.0–8.0)

## 2018-06-18 LAB — POCT PREGNANCY, URINE: Preg Test, Ur: POSITIVE — AB

## 2018-06-18 MED ORDER — PROMETHAZINE HCL 12.5 MG PO TABS: 12.5000 mg | ORAL_TABLET | Freq: Four times a day (QID) | ORAL | 0 refills | Status: DC | PRN

## 2018-06-18 NOTE — MAU Note (Signed)
Pt presents for pregnancy confirmation & nausea.  Denies vomiting.  +HPT approx 1.5 weeks ago.  Reports LMP 05/13/2018. Denies abdominal pain/cramping or VB.

## 2018-06-18 NOTE — MAU Provider Note (Addendum)
Shelly Gibbs is a 32 y.o. [id number] who present to MAU today for pregnancy confirmation and nausea. She reports taking a HPT that was positive today. She denies abdominal pain or vaginal bleeding.   BP 122/81 (BP Location: Right Arm)   Pulse 93   Temp 98 F (36.7 C) (Oral)   Resp 20   Ht 5\' 2"  (1.575 m)   Wt 83.6 kg   LMP 05/13/2018   SpO2 100%   BMI 33.70 kg/m  CONSTITUTIONAL: Well-developed, well-nourished female in no acute distress.  CARDIOVASCULAR: Normal heart rate noted RESPIRATORY: Effort and breath sounds normal GASTROINTESTINAL:Soft, no distention noted.  No tenderness, rebound or guarding.  SKIN: Skin is warm and dry. No rash noted. Not diaphoretic. No erythema. No pallor. PSYCHIATRIC: Normal mood and affect. Normal behavior. Normal judgment and thought content.  MDM Medical screening exam complete Patient does not endorse any symptoms concerning for ectopic pregnancy or pregnancy related complication today.  Patient drove self to hospital- unable to give nausea medication prior to discharge  UA- negative   Results for orders placed or performed during the hospital encounter of 06/18/18 (from the past 24 hour(s))  Urinalysis, Routine w reflex microscopic     Status: Abnormal   Collection Time: 06/18/18  4:35 PM  Result Value Ref Range   Color, Urine YELLOW YELLOW   APPearance HAZY (A) CLEAR   Specific Gravity, Urine 1.028 1.005 - 1.030   pH 5.0 5.0 - 8.0   Glucose, UA NEGATIVE NEGATIVE mg/dL   Hgb urine dipstick NEGATIVE NEGATIVE   Bilirubin Urine NEGATIVE NEGATIVE   Ketones, ur NEGATIVE NEGATIVE mg/dL   Protein, ur NEGATIVE NEGATIVE mg/dL   Nitrite NEGATIVE NEGATIVE   Leukocytes, UA TRACE (A) NEGATIVE   RBC / HPF 0-5 0 - 5 RBC/hpf   WBC, UA 0-5 0 - 5 WBC/hpf   Bacteria, UA NONE SEEN NONE SEEN   Squamous Epithelial / LPF 11-20 0 - 5   Mucus PRESENT   Pregnancy, urine POC     Status: Abnormal   Collection Time: 06/18/18  4:40 PM  Result Value Ref Range    Preg Test, Ur POSITIVE (A) NEGATIVE   A:  Amenorrhea Positive pregnancy test Nausea during pregnancy   P: Discharge home Rx for Phenergan sent to pharmacy of choice  Reasons to return to MAU reviewed  Patient may return to MAU as needed or if her condition were to change or worsen  Sharyon Cable, CNM 06/18/2018 5:36 PM

## 2018-06-18 NOTE — Discharge Instructions (Signed)

## 2018-06-20 IMAGING — US US OB TRANSVAGINAL
1 series · 15 of 28 positions shown · non-contrast
Comparison: Ultrasound 05/27/2013

CLINICAL DATA: Vaginal bleeding and pain.  Beta HCG 559

EXAM:
OBSTETRIC <14 WK US AND TRANSVAGINAL OB US
TECHNIQUE: Both transabdominal and transvaginal ultrasound examinations were
performed for complete evaluation of the gestation as well as the
maternal uterus, adnexal regions, and pelvic cul-de-sac.
Transvaginal technique was performed to assess early pregnancy.

[Series 1: us ob transvaginal · 15 of 62 slices shown]
[im 1/62]
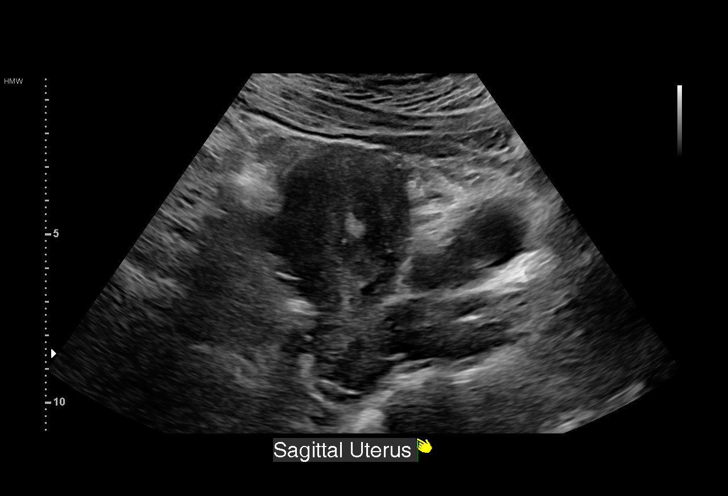
[im 5/62]
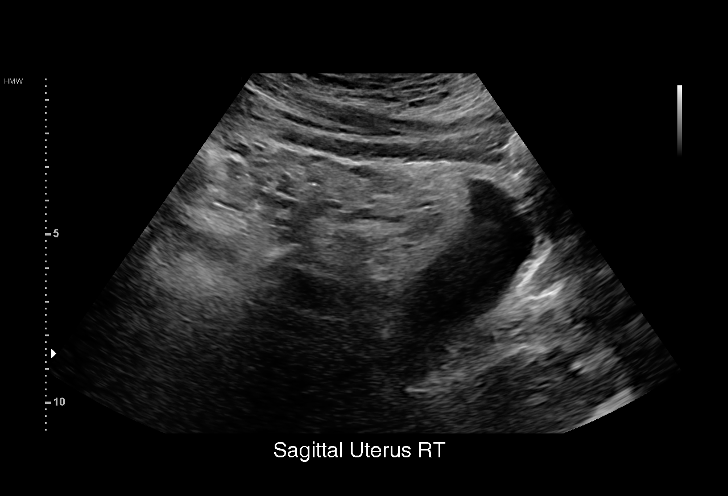
[im 10/62]
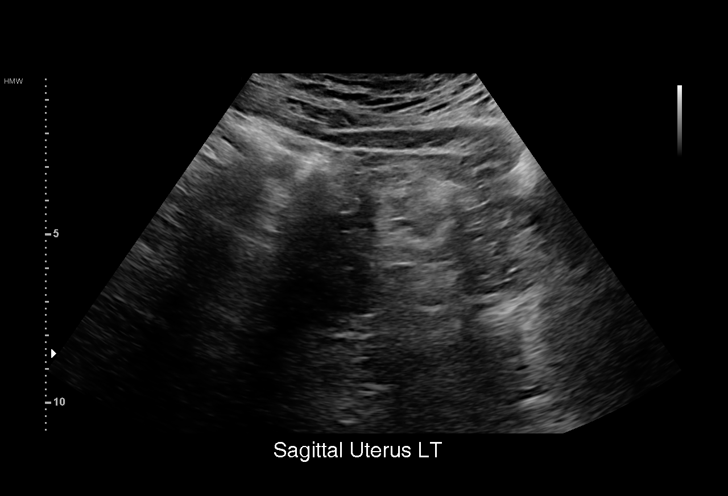
[im 14/62]
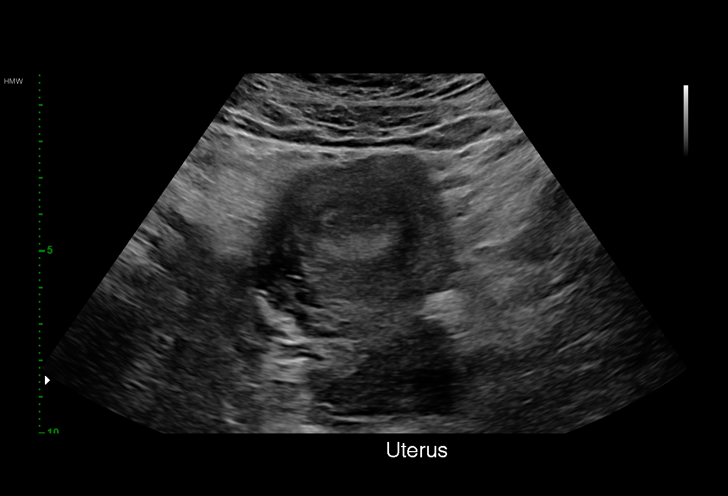
[im 19/62]
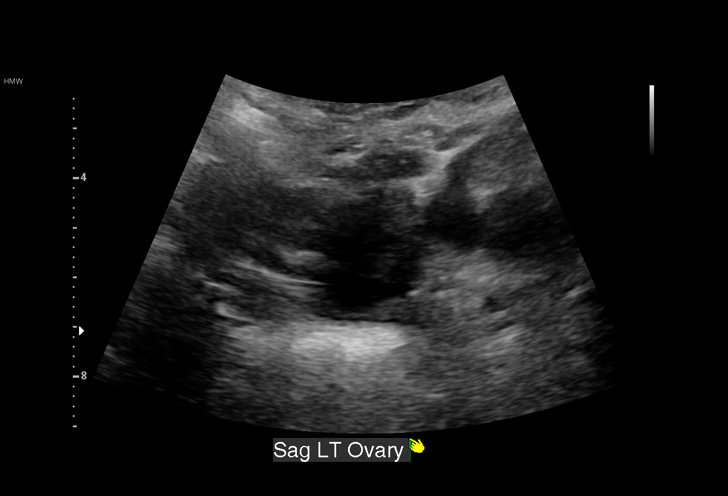
[im 23/62]
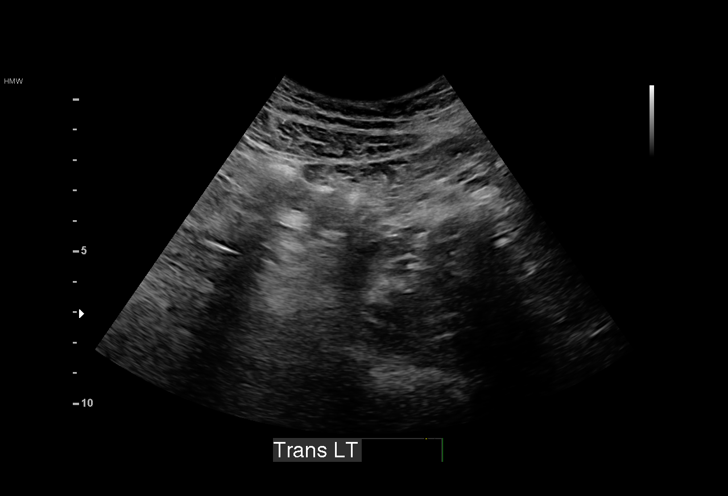
[im 28/62]
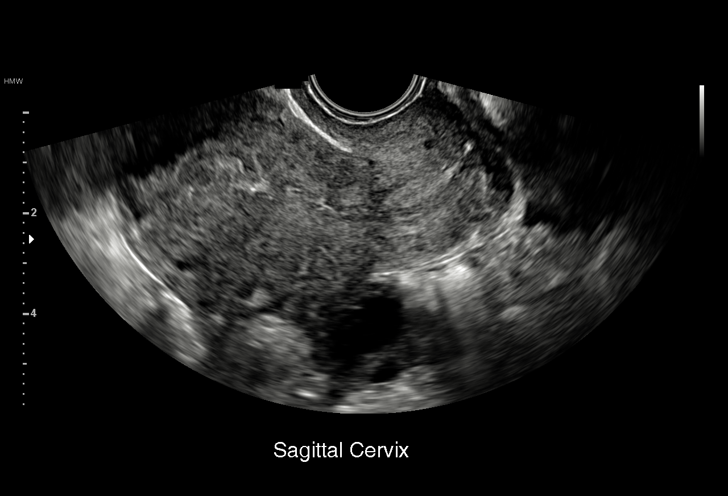
[im 32/62]
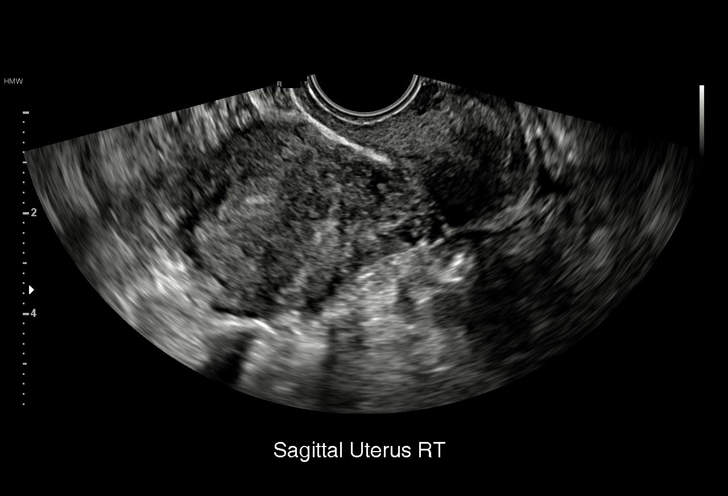
[im 34/62]
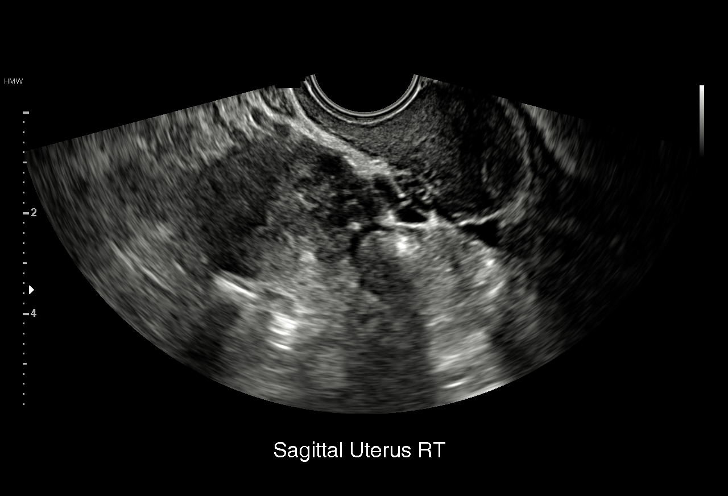
[im 39/62]
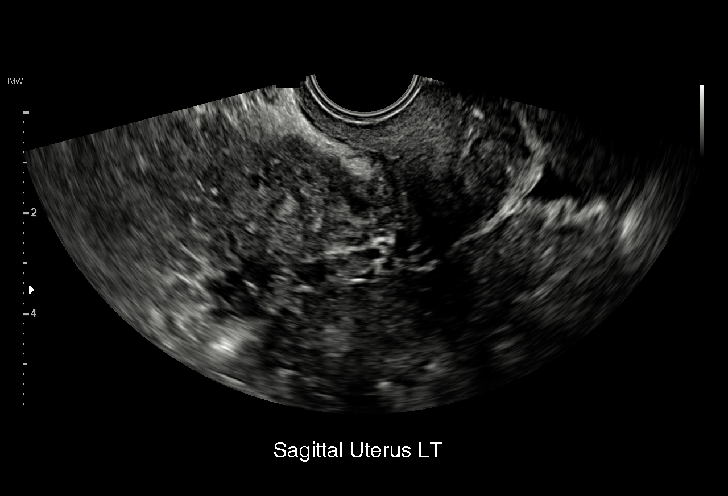
[im 43/62]
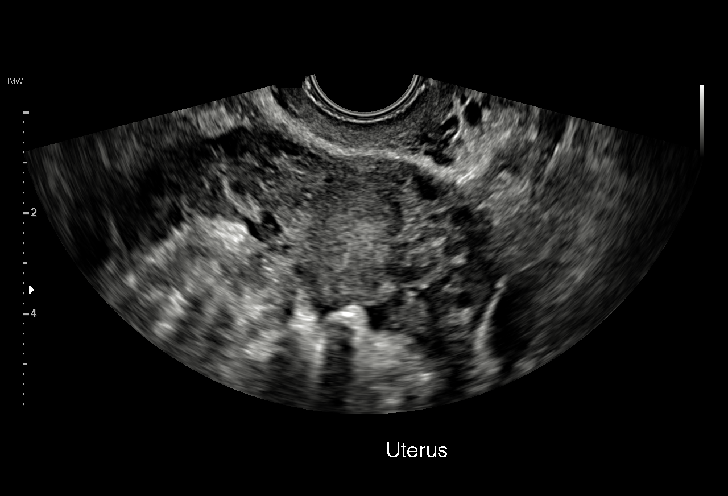
[im 48/62]
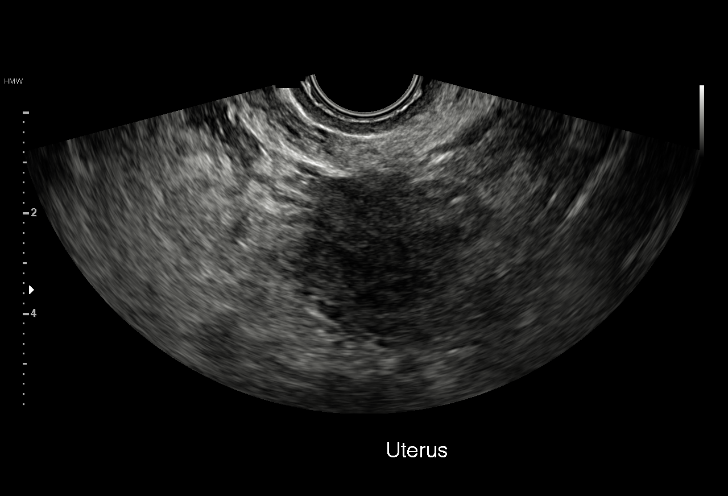
[im 52/62]
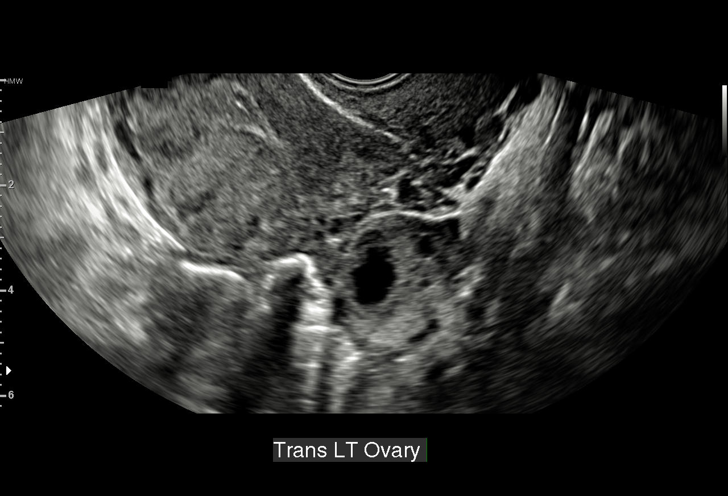
[im 57/62]
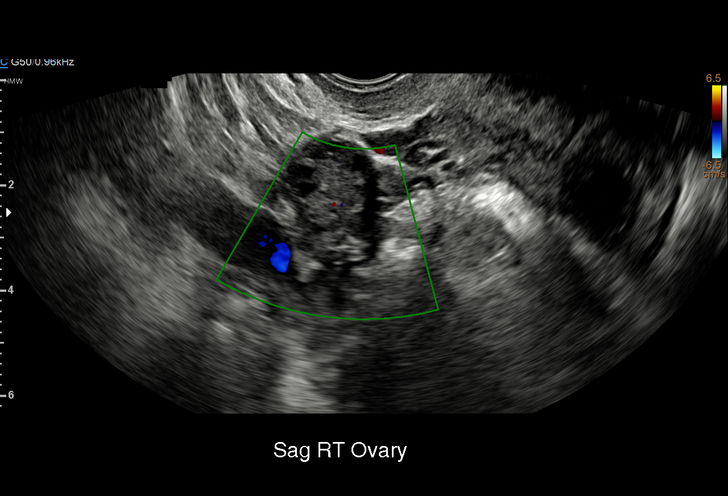
[im 62/62]
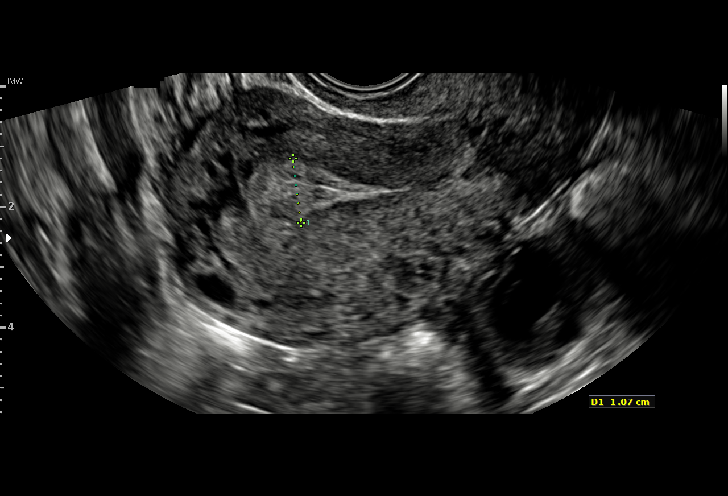

[15 of 28 positions shown; findings below may reference images not displayed]

FINDINGS: Intrauterine gestational sac: Not present

Yolk sac:  Not present

Embryo:  Not present

Cardiac Activity: Not present

Heart Rate: Not present  Bpm

MSD:   mm    w     d

CRL:    mm    w    d                  US EDC:

Subchorionic hemorrhage:  None visualized.

Maternal uterus/adnexae: Normal ovaries. Endometrium is homogeneous
measuring 10 mm in diameter. Small amount of free fluid.
IMPRESSION: No intrauterine pregnancy identified. No adnexal mass. This could
represent early pregnancy, spontaneous abortion, or ectopic
pregnancy. Close follow-up recommended.

## 2018-09-07 ENCOUNTER — Other Ambulatory Visit: Payer: Self-pay

## 2018-09-07 ENCOUNTER — Encounter (HOSPITAL_BASED_OUTPATIENT_CLINIC_OR_DEPARTMENT_OTHER): Payer: Self-pay | Admitting: Emergency Medicine

## 2018-09-07 ENCOUNTER — Emergency Department (HOSPITAL_BASED_OUTPATIENT_CLINIC_OR_DEPARTMENT_OTHER)
Admission: EM | Admit: 2018-09-07 | Discharge: 2018-09-07 | Disposition: A | Discharge status: Home / Self Care | Payer: Medicaid Other | Attending: Emergency Medicine | Admitting: Emergency Medicine

## 2018-09-07 DIAGNOSIS — A5901 Trichomonal vulvovaginitis: Secondary | ICD-10-CM | POA: Insufficient documentation

## 2018-09-07 DIAGNOSIS — B9689 Other specified bacterial agents as the cause of diseases classified elsewhere: Secondary | ICD-10-CM

## 2018-09-07 DIAGNOSIS — Z91040 Latex allergy status: Secondary | ICD-10-CM | POA: Insufficient documentation

## 2018-09-07 DIAGNOSIS — J45909 Unspecified asthma, uncomplicated: Secondary | ICD-10-CM | POA: Insufficient documentation

## 2018-09-07 DIAGNOSIS — N760 Acute vaginitis: Secondary | ICD-10-CM | POA: Insufficient documentation

## 2018-09-07 DIAGNOSIS — N898 Other specified noninflammatory disorders of vagina: Secondary | ICD-10-CM | POA: Diagnosis present

## 2018-09-07 DIAGNOSIS — A599 Trichomoniasis, unspecified: Secondary | ICD-10-CM

## 2018-09-07 LAB — WET PREP, GENITAL
Sperm: NONE SEEN
Yeast Wet Prep HPF POC: NONE SEEN

## 2018-09-07 LAB — URINALYSIS, ROUTINE W REFLEX MICROSCOPIC
Bilirubin Urine: NEGATIVE
Glucose, UA: NEGATIVE mg/dL
Hgb urine dipstick: NEGATIVE
Ketones, ur: NEGATIVE mg/dL
Leukocytes, UA: NEGATIVE
Nitrite: NEGATIVE
Protein, ur: NEGATIVE mg/dL
Specific Gravity, Urine: 1.02 (ref 1.005–1.030)
pH: 6 (ref 5.0–8.0)

## 2018-09-07 LAB — PREGNANCY, URINE: Preg Test, Ur: NEGATIVE

## 2018-09-07 MED ORDER — TINIDAZOLE 500 MG PO TABS: 2.0000 g | ORAL_TABLET | Freq: Every day | ORAL | 0 refills | Status: AC

## 2018-09-07 MED FILL — TINIDAZOLE 500 MG TABS: 500 | 2 days supply | Qty: 8 | Fill #0

## 2018-09-07 NOTE — ED Triage Notes (Signed)
Reports vaginal discharge with odor for 2 months.

## 2018-09-07 NOTE — ED Notes (Signed)
ED Provider at bedside performing pelvic exam.

## 2018-09-07 NOTE — Discharge Instructions (Addendum)
Prescribed antibiotics to treat your trichomonas along with your bacterial vaginosis please take 4 pills today and 4 pills the following day.  We will treat both infections. Gonorrhea and Chlamydia test will result by the end of the week you may check the results online.

## 2018-09-07 NOTE — ED Provider Notes (Signed)
[facility] EMERGENCY DEPARTMENT Provider Note   CSN: [id number] Arrival date & time: 09/07/18  1435     History   Chief Complaint Chief Complaint  Patient presents with  . Vaginal Discharge    HPI Shelly Gibbs is a 33 y.o. female.  33 y.o female with a PMH of Gonorrhea, Chlamydia, Trichomonas presents to the ED with a chief complaint of intermittent vaginal discharge x 2 months. Patient discharges her discharge as brown in nature with a foul odor to it. She reports using body soap along with dove when the irritation began. She states irritation around the area along with urination. She is sexually active and doe snot use any form of birth control. Patient had an elective abortion on 11/20 and reports not having follow up after the procedure. She denies any vaginal bleeding, dysuria, fever, or abdominal pain.     Past Medical History:  Diagnosis Date  . Asthma    inhaler last used "long time ago"  . BV (bacterial vaginosis)   . Chlamydia   . Gonorrhea 08-03-2012  . Headache(784.0)   . Trichomonas infection   . UTI (lower urinary tract infection)   . Yeast infection     Patient Active Problem List   Diagnosis Date Noted  . Visit for wound check 10/28/2016  . Indication for care in labor or delivery 12/20/2013  . Pregnancy 12/07/2013  . Bacterial vaginosis 07/30/2012  . Impetigo 07/30/2012    Past Surgical History:  Procedure Laterality Date  . DILATION AND CURETTAGE OF UTERUS    . INDUCED ABORTION     x 2     OB History    Gravida  9   Para  2   Term  2   Preterm  0   AB  6   Living  2     SAB  1   TAB  5   Ectopic  0   Multiple  0   Live Births  2            Home Medications    Prior to Admission medications   Medication Sig Start Date End Date Taking? Authorizing Provider  promethazine (PHENERGAN) 12.5 MG tablet Take 1 tablet (12.5 mg total) by mouth every 6 (six) hours as needed for nausea or vomiting. 06/18/18    Sharyon Cable, CNM  tinidazole (TINDAMAX) 500 MG tablet Take 4 tablets (2,000 mg total) by mouth daily with breakfast for 2 days. 09/07/18 09/09/18  Claude Manges, PA-C    Family History Family History  Problem Relation Age of Onset  . Diabetes Maternal Aunt   . Anesthesia problems Neg Hx   . Other Neg Hx   . Hearing loss Neg Hx     Social History Social History   Tobacco Use  . Smoking status: Never Smoker  . Smokeless tobacco: Never Used  Substance Use Topics  . Alcohol use: Yes    Comment: occasional   . Drug use: No     Allergies   Latex; Percocet [oxycodone-acetaminophen]; and Tylenol [acetaminophen]   Review of Systems Review of Systems  Constitutional: Negative for chills and fever.  HENT: Negative for ear pain and sore throat.   Eyes: Negative for pain and visual disturbance.  Respiratory: Negative for cough and shortness of breath.   Cardiovascular: Negative for chest pain and palpitations.  Gastrointestinal: Negative for abdominal pain and vomiting.  Genitourinary: Positive for vaginal discharge. Negative for decreased urine volume, difficulty urinating, dysuria,  hematuria and vaginal bleeding.  Musculoskeletal: Negative for arthralgias and back pain.  Skin: Negative for color change and rash.  Neurological: Negative for seizures and syncope.  All other systems reviewed and are negative.    Physical Exam Updated Vital Signs BP (!) 137/101 (BP Location: Left Arm)   Pulse 88   Temp 98.2 F (36.8 C) (Oral)   Resp 18   Ht 5\' 2"  (1.575 m)   Wt 81.6 kg   LMP 05/13/2018 Comment: D&C on 07/01/18.  SpO2 100%   Breastfeeding Unknown   BMI 32.92 kg/m   Physical Exam Vitals signs and nursing note reviewed. Exam conducted with a chaperone present.  Constitutional:      General: She is not in acute distress.    Appearance: She is well-developed. She is not ill-appearing.  HENT:     Head: Normocephalic and atraumatic.     Mouth/Throat:     Pharynx: No  oropharyngeal exudate.  Eyes:     Pupils: Pupils are equal, round, and reactive to light.  Neck:     Musculoskeletal: Normal range of motion.  Cardiovascular:     Rate and Rhythm: Regular rhythm.     Heart sounds: Normal heart sounds.  Pulmonary:     Effort: Pulmonary effort is normal. No respiratory distress.     Breath sounds: Normal breath sounds. No decreased breath sounds or wheezing.  Abdominal:     General: Bowel sounds are normal. There is no distension.     Palpations: Abdomen is soft.     Tenderness: There is no abdominal tenderness.  Genitourinary:    Pubic Area: No rash or pubic lice.      Vagina: Normal.     Cervix: No cervical motion tenderness.     Adnexa:        Right: No tenderness.         Left: No tenderness.       Comments: Thickened white, thick discharge from cervix, foul odor with discharge.  No cervical motion tenderness, no adnexa tenderness. Chaperoned by Lequita HaltMorgan Nurse tech. Musculoskeletal:        General: No tenderness or deformity.     Right lower leg: No edema.     Left lower leg: No edema.  Skin:    General: Skin is warm and dry.  Neurological:     Mental Status: She is alert and oriented to person, place, and time.      ED Treatments / Results  Labs (all labs ordered are listed, but only abnormal results are displayed) Labs Reviewed  WET PREP, GENITAL - Abnormal; Notable for the following components:      Result Value   Trich, Wet Prep PRESENT (*)    Clue Cells Wet Prep HPF POC PRESENT (*)    WBC, Wet Prep HPF POC MANY (*)    All other components within normal limits  PREGNANCY, URINE  URINALYSIS, ROUTINE W REFLEX MICROSCOPIC  GC/CHLAMYDIA PROBE AMP ([facility]) NOT [facility]    EKG None  Radiology No results found.  Procedures Procedures (including critical care time)  Medications Ordered in ED Medications - No data to display   Initial Impression / Assessment and Plan / ED Course  I have reviewed the triage vital signs  and the nursing notes.  Pertinent labs & imaging results that were available during my care of the patient were reviewed by me and considered in my medical decision making (see chart for details).    Presents with vaginal  discharge x2 months.  Previous history of STI such as trichomonas, chlamydia, gonorrhea.  Patient has been treated with oral antibiotics previously.  During pelvic examination chaperoned by nurse tech patient has a foul odor discharge from the vagina, no cervical motion tenderness, abdominal pain, adnexa tenderness. Had a elective abortion in November, reports she has not been getting up.  But denies any vaginal bleeding during evaluation. Low suspicision for retained products as patient is not having any bleeding currently, no abdominal pain. Urinalysis showed no nitrates, leukocytes, other sources of infection she denies any urinary symptoms at this time.  Wet prep showed trichomonas, clue cells.  Patient reports she was previously treated for trichomonas and is unable to take or tolerate Flagyl at this time, according to chart patient was treated with clindamycin, I have personally consulted pharmacy recommends patient be treated for trichomonas and bacterial vaginosis with tinnitus I will 2 g x 2-day which should cover both.  She has been instructed of these instructions.  She reports understanding, agrees with plan and management.  At this time patient will be discharged home with the following antibiotics.  No further work-up indicated at this time.  Vitals stable for discharge.  Final Clinical Impressions(s) / ED Diagnoses   Final diagnoses:  BV (bacterial vaginosis)  Trichomonas infection    ED Discharge Orders         Ordered    tinidazole (TINDAMAX) 500 MG tablet  Daily with breakfast     09/07/18 1711           Claude MangesSoto, Mostyn Varnell, PA-C 09/07/18 1715    Virgina NorfolkCuratolo, Adam, DO 09/08/18 0107

## 2018-09-08 LAB — GC/CHLAMYDIA PROBE AMP (~~LOC~~) NOT AT ARMC
Chlamydia: NEGATIVE
Neisseria Gonorrhea: NEGATIVE

## 2019-12-04 ENCOUNTER — Ambulatory Visit (INDEPENDENT_AMBULATORY_CARE_PROVIDER_SITE_OTHER)
Admission: RE | Admit: 2019-12-04 | Discharge: 2019-12-04 | Disposition: A | Discharge status: Home / Self Care | Payer: Medicaid Other | Source: Ambulatory Visit

## 2019-12-04 DIAGNOSIS — Z3A01 Less than 8 weeks gestation of pregnancy: Secondary | ICD-10-CM | POA: Diagnosis not present

## 2019-12-04 DIAGNOSIS — B373 Candidiasis of vulva and vagina: Secondary | ICD-10-CM

## 2019-12-04 DIAGNOSIS — B3731 Acute candidiasis of vulva and vagina: Secondary | ICD-10-CM

## 2019-12-04 MED ORDER — FLUCONAZOLE 200 MG PO TABS: 200.0000 mg | ORAL_TABLET | Freq: Once | ORAL | 0 refills | Status: AC

## 2019-12-04 NOTE — Discharge Instructions (Signed)
As we discussed, Diflucan may cause harm to embryo should you decide to not undergo elective termination of pregnancy. Very important to keep your appointment on 5/5 for further treatment thereof. Take 1 tablet of Diflucan today, another in 3 days if needed. You will need to have in person evaluation for persistent or worsening symptoms such as pelvic pain, abdominal or back pain, fever.

## 2019-12-04 NOTE — ED Provider Notes (Signed)
Virtual Visit via Video Note:  Shelly Gibbs  initiated request for Telemedicine visit with [facility] Urgent Care team. I connected with Shelly Gibbs  on 12/04/2019 at 11:52 AM  for a synchronized telemedicine visit using a video enabled HIPPA compliant telemedicine application. I verified that I am speaking with Shelly Gibbs  using two identifiers. Shelly Hall-Potvin, PA-C  was physically located in a [facility]gent care site and Shelly Gibbs was located at a different location.   The limitations of evaluation and management by telemedicine as well as the availability of in-person appointments were discussed. Patient was informed that she  may incur a bill ( including co-pay) for this virtual visit encounter. Shelly Gibbs  expressed understanding and gave verbal consent to proceed with virtual visit.     History of Present Illness:Shelly Gibbs  is a 34 y.o. female with history of yeast vaginitis, BV presents with concern for vaginal yeast infection.  Endorsing severe vulvovaginal pruritus with thick, cottage cheese discharge for the last 3 days.  States this feels similar to yeast infections.  Has not had BV flare "in a while ".  Patient is pregnant: Took home test 3 days ago.  LMP 1 month ago.  States she already has appointment for elective termination on 5/5: Intends to eat this appointment.  Requesting Diflucan specifically.  Denying vaginal, pelvic or abdominal pain, back pain, fever.  No urinary symptoms such as frequency or urgency.    ROI as per HPI  Past Medical History:  Diagnosis Date  . Asthma    inhaler last used "long time ago"  . BV (bacterial vaginosis)   . Chlamydia   . Gonorrhea 08-03-2012  . Headache(784.0)   . Trichomonas infection   . UTI (lower urinary tract infection)   . Yeast infection     Allergies  Allergen Reactions  . Latex Itching    Burning   . Percocet [Oxycodone-Acetaminophen] Hives  . Tylenol [Acetaminophen] Hives         Observations/Objective: 34 year old female Sitting in no acute distress.  Patient is able to speak in full sentences without coughing, sneezing, wheezing.  Assessment and Plan: H&P consistent with yeast vaginitis.  Spent time discussing potential risk to embryo with Diflucan use in first trimester.  Patient is adamant she is intending to keep 5/5 appointment for elective termination of pregnancy.  Currently less than [redacted] weeks gestation.  Willing to send Diflucan as patient has verbalized risks to embryo should she change her mind on termination.  Patient to keep appointment on 5/5.  Will seek in-person evaluation for persistent or worsening symptoms.  Return precautions discussed, patient verbalized understanding and is agreeable to plan.  Follow Up Instructions: Patient to keep follow-up with family-planning on 5/5.  Will seek in-person evaluation for persistent or worsening vaginal symptoms.   I discussed the assessment and treatment plan with the patient. The patient was provided an opportunity to ask questions and all were answered. The patient agreed with the plan and demonstrated an understanding of the instructions.   The patient was advised to call back or seek an in-person evaluation if the symptoms worsen or if the condition fails to improve as anticipated.  I provided 15 minutes of non-face-to-face time during this encounter.    Shelly Hall-Potvin, PA-C  12/04/2019 11:52 AM        Gibbs, [place], PA-C 12/04/19 1152

## 2020-02-10 DIAGNOSIS — Z419 Encounter for procedure for purposes other than remedying health state, unspecified: Secondary | ICD-10-CM | POA: Diagnosis not present

## 2020-03-12 DIAGNOSIS — Z419 Encounter for procedure for purposes other than remedying health state, unspecified: Secondary | ICD-10-CM | POA: Diagnosis not present

## 2020-04-12 DIAGNOSIS — Z419 Encounter for procedure for purposes other than remedying health state, unspecified: Secondary | ICD-10-CM | POA: Diagnosis not present

## 2020-05-12 DIAGNOSIS — Z419 Encounter for procedure for purposes other than remedying health state, unspecified: Secondary | ICD-10-CM | POA: Diagnosis not present

## 2020-06-05 ENCOUNTER — Other Ambulatory Visit: Payer: Self-pay

## 2020-06-05 ENCOUNTER — Ambulatory Visit (INDEPENDENT_AMBULATORY_CARE_PROVIDER_SITE_OTHER): Payer: Medicaid Other

## 2020-06-05 ENCOUNTER — Other Ambulatory Visit (HOSPITAL_COMMUNITY)
Admission: RE | Admit: 2020-06-05 | Discharge: 2020-06-05 | Disposition: A | Discharge status: Home / Self Care | Payer: Medicaid Other | Source: Ambulatory Visit

## 2020-06-05 VITALS — BP 139/100 | HR 62 | Wt 192.1 lb

## 2020-06-05 DIAGNOSIS — N898 Other specified noninflammatory disorders of vagina: Secondary | ICD-10-CM

## 2020-06-05 DIAGNOSIS — Z1239 Encounter for other screening for malignant neoplasm of breast: Secondary | ICD-10-CM

## 2020-06-05 DIAGNOSIS — Z3202 Encounter for pregnancy test, result negative: Secondary | ICD-10-CM

## 2020-06-05 DIAGNOSIS — I10 Essential (primary) hypertension: Secondary | ICD-10-CM | POA: Diagnosis not present

## 2020-06-05 DIAGNOSIS — Z3042 Encounter for surveillance of injectable contraceptive: Secondary | ICD-10-CM

## 2020-06-05 DIAGNOSIS — Z124 Encounter for screening for malignant neoplasm of cervix: Secondary | ICD-10-CM

## 2020-06-05 DIAGNOSIS — Z30013 Encounter for initial prescription of injectable contraceptive: Secondary | ICD-10-CM

## 2020-06-05 DIAGNOSIS — Z01419 Encounter for gynecological examination (general) (routine) without abnormal findings: Secondary | ICD-10-CM

## 2020-06-05 MED ORDER — MEDROXYPROGESTERONE ACETATE 150 MG/ML IM SUSP
150.0000 mg | Freq: Once | INTRAMUSCULAR | Status: AC
  Administered 2020-06-05: 150 mg via INTRAMUSCULAR

## 2020-06-05 MED ORDER — METRONIDAZOLE 0.75 % VA GEL: 1.0000 | Freq: Every day | VAGINAL | 0 refills | Status: DC

## 2020-06-05 NOTE — Progress Notes (Signed)
GYNECOLOGY OFFICE VISIT NOTE-WELL WOMAN EXAM  History:   Shelly Gibbs [id number] here today for "pap smear. "  Patient reports she is due for her annual exam and has been having vaginal discharge.   She reports her LMP was Oct 16th and was normal; 5 days, "regular" flow, and without cramping.  Patient states she has back pain prior to menses, but nothing else.  Patient reports desire for birth control method today.    Birth Control: IUD removed "because my body was rejecting it." She reports she has had it twice and is not currently on any method.   Reproductive Concerns: Partners in last year: One She denies any abnormal bleeding, pelvic pain or pain/discomfort during sex. She reports she has been having vaginal discharge for the past 3 weeks.  She states she has boric acid intermittently without any improvement.  STD Testing-Full  Breast Exams-Daily in the shower. No complaints or concerns.  Family history of breast, uterine, cervical, or ovarian cancer: Denies  Medical and Nutrition: PCP: None Exercise: "not more than I need to."  Walking once week.  Tobacco/Drugs/Alcohol: Drinks occasionally.  Nutrition: "I'm working on that b/c I want to lose weight."   Social:  Safety at home: Yes DV/A: None Social Support: Endorses Employment: Social worker School: [employer]  Past Medical History:  Diagnosis Date  . Asthma    inhaler last used "long time ago"  . BV (bacterial vaginosis)   . Chlamydia   . Gonorrhea 08-03-2012  . Headache(784.0)   . Trichomonas infection   . UTI (lower urinary tract infection)   . Yeast infection     Past Surgical History:  Procedure Laterality Date  . DILATION AND CURETTAGE OF UTERUS    . INDUCED ABORTION     x 2    The following portions of the patient's history were reviewed and updated as appropriate: allergies, current medications, past family history, past medical history, past social history, past surgical history and  problem list.    Health Maintenance:  Normal pap and negative HRHPV on April 2018.  No mammogram on file.   Review of Systems:  Pertinent items noted in HPI and remainder of comprehensive ROS otherwise negative.    Objective:    Physical Exam BP (!) 139/100   Pulse 62   Wt 192 lb 1.6 oz (87.1 kg)   LMP 05/26/2020   BMI 35.14 kg/m  Physical Exam Exam conducted with a chaperone present.  Constitutional:      Appearance: Normal appearance.  HENT:     Head: Normocephalic and atraumatic.  Eyes:     Conjunctiva/sclera: Conjunctivae normal.  Neck:     Thyroid: No thyroid mass, thyromegaly or thyroid tenderness.  Cardiovascular:     Rate and Rhythm: Normal rate and regular rhythm.     Heart sounds: Normal heart sounds.  Pulmonary:     Effort: Pulmonary effort is normal. No respiratory distress.     Breath sounds: Normal breath sounds.  Chest:     Breasts:        Right: No inverted nipple, mass, nipple discharge or skin change.        Left: No inverted nipple, mass, nipple discharge or skin change.     Comments: CBE performed and without significant findings.  Abdominal:     General: Bowel sounds are normal.     Palpations: Abdomen is soft.     Tenderness: There is no abdominal tenderness.  Genitourinary:  Vagina: Vaginal discharge present.     Cervix: Friability present. No cervical motion tenderness or lesion.     Comments: Vagina with milky white discharge and apparent odor. Vault with pinkish white tone.  Minimal rugae and no apparent atrophy.  Cervix visualized without polyps or cysts and pap collected with brush and spatula. Friable after pap. Uterine size and position limited s/t body habitus.  Musculoskeletal:     Cervical back: Normal range of motion. No rigidity.  Skin:    General: Skin is warm and dry.  Neurological:     Mental Status: She is alert and oriented to person, place, and time.  Psychiatric:        Mood and Affect: Mood normal.        Behavior:  Behavior normal.      Labs and Imaging No results found for this or any previous visit (from the past 168 hour(s)). No results found.   Assessment & Plan:     34 year old Female Annual Exam Desires Contraception   1. Encounter for well woman exam with routine gynecological exam -Exam performed and findings discussed. -Informed of turnover time and provider/clinic policy on releasing results. -Encouraged to get assistance with utilization of Mychart. -Educated on AHA exercise recommendations of 30 minutes of moderate to vigorous activity at least 5x/week.  2. Pap smear for cervical cancer screening and encounter for breast screening examination.  -Educated on ASCCP guidelines regarding pap smear evaluation and frequency. -Educated and encouraged to initiate monthly SBE with increased breast awareness including examination of breast for skin changes, moles, tenderness, etc.  - Cytology - PAP( [place])  3. Vaginal discharge -Patient informed that discharge noted on exam. -Odor c/w Bacterial Vaginosis. -Will send script for Metrogel. -Patient instructed to use gel x5 nights, then utilize boric acid 600mg  x 21 days then weekly for 9 weeks. -Patient instructed not to make her own capsules as she could not guarantee appropriate dosing. -Will await results and treat other issues as appropriate.  - Cervicovaginal ancillary only( [place])   4. Elevated blood pressure reading with diagnosis of hypertension -Informed that blood pressure is elevated. -Discussed that based on review of chart likely essential HTN. -However, discussed need for follow up with primary care provider. -Given information for PCP in the community.   5. Encounter for initial prescription of injectable contraceptive -Reviewed appropriate birth control methods today considering BP. -Patient desires Depo Provera -Reviewed potential for change in bleeding pattern.   6. Negative pregnancy test -Okay to  proceed with Depo Provera Injection.  Routine preventative health maintenance measures emphasized. Please refer to After Visit Summary for other counseling recommendations.   No follow-ups on file.      , CNM 06/05/2020

## 2020-06-05 NOTE — Patient Instructions (Signed)
Gwinda Passe, NP [facility] [address] [place],  [place]  [address] Main: [phone]  Fax: [phone]

## 2020-06-06 ENCOUNTER — Telehealth: Payer: Self-pay

## 2020-06-06 DIAGNOSIS — A749 Chlamydial infection, unspecified: Secondary | ICD-10-CM

## 2020-06-06 DIAGNOSIS — A599 Trichomoniasis, unspecified: Secondary | ICD-10-CM

## 2020-06-06 LAB — CERVICOVAGINAL ANCILLARY ONLY
Bacterial Vaginitis (gardnerella): POSITIVE — AB
Candida Glabrata: NEGATIVE
Candida Vaginitis: NEGATIVE
Chlamydia: POSITIVE — AB
Comment: NEGATIVE
Comment: NEGATIVE
Comment: NEGATIVE
Comment: NEGATIVE
Comment: NEGATIVE
Comment: NORMAL
Neisseria Gonorrhea: NEGATIVE
Trichomonas: POSITIVE — AB

## 2020-06-06 LAB — POCT PREGNANCY, URINE: Preg Test, Ur: NEGATIVE

## 2020-06-06 MED ORDER — METRONIDAZOLE 500 MG PO TABS: 2000.0000 mg | ORAL_TABLET | Freq: Once | ORAL | 0 refills | Status: AC

## 2020-06-06 MED ORDER — AZITHROMYCIN 500 MG PO TABS: 1000.0000 mg | ORAL_TABLET | Freq: Once | ORAL | 0 refills | Status: AC

## 2020-06-06 NOTE — Telephone Encounter (Signed)
Shelly Gibbs 09-Apr-1986 [id number]   Patient called and verified her identity via birth date and last 4 of her SSN.  Patient agreeable to results via phone and was informed of positive Trichomoniasis and Chlamydia. Patient appropriately upset. Reassured that infections are treatable and medications to be sent to pharmacy on file.  Advised to not take both medications in the same day, but order of completion not concerning.  Informed that Trich to be treated with 4 tablets of Metronidazole to be taken in one dose and CT to be treated with 2 tablets of Zithro to be taken in one dose.  Patient verbalized understanding.  Informed that her partner would also need to be treated for these infections as well and she should abstain from sexual intercourse until 2 weeks after partner treated.  Patient without questions or concerns.  Medications sent to pharmacy on file, which was verified by patient prior to end of call. Patient informed that if she would like a TOC at her next depo provera visit that could be accommodated.   Cherre Robins MSN, CNM Advanced Practice Provider, [facility]   **This visit was completed, in its entirety, via telehealth communications.  I personally spent >/=2 minutes on the phone providing recommendations, education, and guidance.**

## 2020-06-07 LAB — CYTOLOGY - PAP
Comment: NEGATIVE
Diagnosis: UNDETERMINED — AB
High risk HPV: NEGATIVE

## 2020-06-08 DIAGNOSIS — R87610 Atypical squamous cells of undetermined significance on cytologic smear of cervix (ASC-US): Secondary | ICD-10-CM | POA: Insufficient documentation

## 2020-06-12 DIAGNOSIS — Z419 Encounter for procedure for purposes other than remedying health state, unspecified: Secondary | ICD-10-CM | POA: Diagnosis not present

## 2020-07-12 DIAGNOSIS — Z419 Encounter for procedure for purposes other than remedying health state, unspecified: Secondary | ICD-10-CM | POA: Diagnosis not present

## 2020-08-12 DIAGNOSIS — Z419 Encounter for procedure for purposes other than remedying health state, unspecified: Secondary | ICD-10-CM | POA: Diagnosis not present

## 2020-08-21 ENCOUNTER — Ambulatory Visit (INDEPENDENT_AMBULATORY_CARE_PROVIDER_SITE_OTHER): Payer: Medicaid Other

## 2020-08-21 ENCOUNTER — Other Ambulatory Visit: Payer: Self-pay

## 2020-08-21 ENCOUNTER — Other Ambulatory Visit (HOSPITAL_COMMUNITY)
Admission: RE | Admit: 2020-08-21 | Discharge: 2020-08-21 | Disposition: A | Discharge status: Home / Self Care | Payer: Medicaid Other | Source: Ambulatory Visit | Attending: Obstetrics & Gynecology | Admitting: Obstetrics & Gynecology

## 2020-08-21 VITALS — BP 120/91 | HR 73 | Wt 188.3 lb

## 2020-08-21 DIAGNOSIS — A599 Trichomoniasis, unspecified: Secondary | ICD-10-CM | POA: Diagnosis not present

## 2020-08-21 DIAGNOSIS — N760 Acute vaginitis: Secondary | ICD-10-CM | POA: Diagnosis not present

## 2020-08-21 DIAGNOSIS — B9689 Other specified bacterial agents as the cause of diseases classified elsewhere: Secondary | ICD-10-CM | POA: Insufficient documentation

## 2020-08-21 DIAGNOSIS — Z3042 Encounter for surveillance of injectable contraceptive: Secondary | ICD-10-CM

## 2020-08-21 DIAGNOSIS — A749 Chlamydial infection, unspecified: Secondary | ICD-10-CM

## 2020-08-21 MED ORDER — MEDROXYPROGESTERONE ACETATE 150 MG/ML IM SUSP
150.0000 mg | Freq: Once | INTRAMUSCULAR | Status: AC
  Administered 2020-08-21: 150 mg via INTRAMUSCULAR

## 2020-08-21 NOTE — Progress Notes (Addendum)
Kirstine L Savitt here for Depo-Provera Injection. Tested positive for trichomonas, chlamydia, and BV on 06/05/20. Pt completed all antibiotics at that time. Will need TOC today. Reports brown vaginal discharge, pt is concerned BV has not resolved. Explained self-swab will check for this as well. Reports intermittent vaginal bleeding since initiation of Depo Provera 3 months ago, this stopped completely 5 days ago; explained this is not unexpected. Injection administered without complication. Patient will return in 3 months for next injection between 11/07/19-11/21/19. Next annual visit due 06/05/21.   Marjo Bicker, RN 08/21/2020  10:07 AM

## 2020-08-23 NOTE — Progress Notes (Signed)
Patient was assessed and managed by nursing staff during this encounter. I have reviewed the chart and agree with the documentation and plan. I have also made any necessary editorial changes.  Holton Sidman A Iza Preston, MD 08/23/2020 8:17 AM   

## 2020-08-24 ENCOUNTER — Telehealth: Payer: Self-pay

## 2020-08-24 LAB — CERVICOVAGINAL ANCILLARY ONLY
Bacterial Vaginitis (gardnerella): POSITIVE — AB
Candida Glabrata: NEGATIVE
Candida Vaginitis: NEGATIVE
Chlamydia: NEGATIVE
Comment: NEGATIVE
Comment: NEGATIVE
Comment: NEGATIVE
Comment: NEGATIVE
Comment: NEGATIVE
Comment: NORMAL
Neisseria Gonorrhea: NEGATIVE
Trichomonas: NEGATIVE

## 2020-08-24 MED ORDER — METRONIDAZOLE 500 MG PO TABS: 500.0000 mg | ORAL_TABLET | Freq: Two times a day (BID) | ORAL | 0 refills | Status: DC

## 2020-08-24 NOTE — Telephone Encounter (Signed)
-----   Message from Warden Fillers, MD sent at 08/24/2020  8:28 AM EST ----- Bacterial vaginosis noted, will offer treatment

## 2020-08-24 NOTE — Telephone Encounter (Signed)
Attempted to call pt unable to leave a message due to voicemail box not set up.  MyChart message sent.  Flagyl e-prescribed.    Addison Naegeli, RN  08/24/20

## 2020-09-05 MED ORDER — METRONIDAZOLE 0.75 % VA GEL: 1.0000 | Freq: Every day | VAGINAL | 0 refills | Status: DC

## 2020-09-05 NOTE — Addendum Note (Signed)
Addended by: Faythe Casa on: 09/05/2020 03:02 PM   Modules accepted: Orders

## 2020-09-05 NOTE — Telephone Encounter (Signed)
Pt requested for the gel to be prescribed instead of the tablets.  Gel prescribed.    Addison Naegeli, RN  09/05/20

## 2020-09-12 DIAGNOSIS — Z419 Encounter for procedure for purposes other than remedying health state, unspecified: Secondary | ICD-10-CM | POA: Diagnosis not present

## 2020-10-02 ENCOUNTER — Telehealth: Payer: Self-pay | Admitting: Family Medicine

## 2020-10-02 MED ORDER — MEDROXYPROGESTERONE ACETATE 10 MG PO TABS: 20.0000 mg | ORAL_TABLET | Freq: Every day | ORAL | 1 refills | Status: DC

## 2020-10-02 NOTE — Telephone Encounter (Signed)
Pt states she received DEPO on 08-21-20 and has been bleeding since and was told to call if that happened again and will get prescription to help with cycle.

## 2020-10-02 NOTE — Telephone Encounter (Signed)
Called patient to assess bleeding issues. Patient did not answer. LM for her to call the office at her convenience.

## 2020-10-02 NOTE — Telephone Encounter (Addendum)
Returned patients call.   Patient reports she is having periods that are like full periods. She initially bled for 2 months and with second shot she did not bleed for 1.5 weeks since last shot on 1/10. She is changing her pad twice a day and pad is pretty full. It ebbs and flows and sometimes she changes her pad about 3 times a day plus changing tampon several times a day.   Some cramping noted, including back cramps. She is taking Ibuprofen as needed. She reports she is tired.   Reviewed with Dr. Debroah Loop who ordered Provera 20 mg QD x 3 weeks with 1 refill and to call if not improving.   Patient to inform office if bleeding is not improving or resumes after finishing the 21 day course.

## 2020-10-02 NOTE — Addendum Note (Signed)
Addended by: Ed Blalock on: 10/02/2020 02:57 PM   Modules accepted: Orders

## 2020-10-10 ENCOUNTER — Telehealth: Payer: Self-pay | Admitting: Family Medicine

## 2020-10-10 ENCOUNTER — Telehealth: Payer: Self-pay | Admitting: *Deleted

## 2020-10-10 DIAGNOSIS — Z419 Encounter for procedure for purposes other than remedying health state, unspecified: Secondary | ICD-10-CM | POA: Diagnosis not present

## 2020-10-10 NOTE — Telephone Encounter (Signed)
error 

## 2020-10-10 NOTE — Telephone Encounter (Signed)
Pt left VM message stating that she is still having heavy bleeding. She further stated that she took the medication which was prescribed to slow down the bleeding and it has caused the bleeding to increase. Pt wants to know what to do now.

## 2020-10-12 MED ORDER — NORETHIN ACE-ETH ESTRAD-FE 1-20 MG-MCG(24) PO TABS: 1.0000 | ORAL_TABLET | Freq: Every day | ORAL | 11 refills | Status: DC

## 2020-10-12 NOTE — Telephone Encounter (Signed)
Spoke with Dr. Donavan Foil about information patient gave. He reports patient can try a Estrogen containing OCP , patient agreeable.   Patient to follow up in the office in 6-8 weeks for bleeding, she would like to follow up before her next Depo appt. Between 3/28 and 4/11 to discuss whether to continue the Depo or not. Message to front office.

## 2020-10-12 NOTE — Telephone Encounter (Signed)
Returned patients call. patient reports when taking the Provera that her bleeding increased and she has to change her pad an extra 2/day, she took for 5 days and then stopped taking. She has been off for almost a week and the bleeding has slowed back to what it was prior to taking Provera.

## 2020-11-02 ENCOUNTER — Ambulatory Visit (INDEPENDENT_AMBULATORY_CARE_PROVIDER_SITE_OTHER): Payer: Medicaid Other

## 2020-11-02 ENCOUNTER — Other Ambulatory Visit: Payer: Self-pay

## 2020-11-02 VITALS — BP 142/100 | HR 65 | Wt 187.7 lb

## 2020-11-02 DIAGNOSIS — I10 Essential (primary) hypertension: Secondary | ICD-10-CM | POA: Diagnosis not present

## 2020-11-02 DIAGNOSIS — Z3042 Encounter for surveillance of injectable contraceptive: Secondary | ICD-10-CM

## 2020-11-02 MED ORDER — MEDROXYPROGESTERONE ACETATE 150 MG/ML IM SUSP
150.0000 mg | Freq: Once | INTRAMUSCULAR | Status: AC
Start: 2020-11-02 — End: 2020-11-02
  Administered 2020-11-02: 150 mg via INTRAMUSCULAR

## 2020-11-02 NOTE — Progress Notes (Signed)
   GYNECOLOGY PROBLEM OFFICE VISIT NOTE  History:  Shelly Gibbs is a 35 y.o. [id number] here today for AUB in setting of depo provera.  She states she was having daily bleeding that was saturating through pads.  She reports she was given Provera which only caused the bleeding to worsen.  She reports she was then given COCs and the bleeding has stopped. She states that while she was having the bleeding she "just felt so tired."  She reports this has improved some.  She denies any abnormal vaginal discharge, pelvic pain or other concerns. She states she does not have a primary care doctor and thought her elevated blood pressure had resolved.   Past Medical History:  Diagnosis Date  . Asthma    inhaler last used "long time ago"  . BV (bacterial vaginosis)   . Chlamydia   . Gonorrhea 08-03-2012  . Headache(784.0)   . Trichomonas infection   . UTI (lower urinary tract infection)   . Yeast infection     Past Surgical History:  Procedure Laterality Date  . DILATION AND CURETTAGE OF UTERUS    . INDUCED ABORTION     x 2    The following portions of the patient's history were reviewed and updated as appropriate: allergies, current medications, past family history, past medical history, past social history, past surgical history and problem list.   Health Maintenance:  Normal pap and negative HRHPV on Oct 2021.  No mammogram d/t age.   Review of Systems:  Genito-Urinary ROS: no dysuria, trouble voiding, or hematuria Gastrointestinal ROS: no abdominal pain, change in bowel habits, or black or bloody stools Objective:  Vitals: BP (!) 142/100   Pulse 65   Wt 187 lb 11.2 oz (85.1 kg)   BMI 34.33 kg/m   Physical Exam: Physical Exam Constitutional:      Appearance: Normal appearance.  HENT:     Head: Normocephalic and atraumatic.  Eyes:     Conjunctiva/sclera: Conjunctivae normal.  Cardiovascular:     Rate and Rhythm: Normal rate.  Pulmonary:     Effort: Pulmonary effort is  normal. No respiratory distress.  Neurological:     Mental Status: She is alert and oriented to person, place, and time.  Skin:    General: Skin is warm and dry.  Psychiatric:        Mood and Affect: Mood normal.        Behavior: Behavior normal.        Thought Content: Thought content normal.      Labs and Imaging: No results found.  Assessment & Plan:  35 year old AUB-Resolved Encounter for Survelliance of Injectable Contraception Hypertension  -Discussed anticipated improvement of bleeding with 3rd Depo Provera injection. -Instructed to discontinue usage of oral contraception after completion of this pack (Tuesday) -Reviewed risks of stroke with COC usage in setting of elevated blood pressure. -Instructed and given information for PCP follow up.  Encouraged to make appt ASAP. -Encouraged to call if bleeding resumes after discontinuation of COCs. -Discussed how other factors, such as fibroids, could be contributing to bleeding and how if bleeding returns would send for [place]. -Patient offered and declines H&H for review of iron level.  -Patient verbalized understanding and without questions or concerns. -RTO prn   Total face-to-face time with patient: 15 minutes   Gerrit Heck, [place] 11/02/2020 3:53 PM

## 2020-11-02 NOTE — Patient Instructions (Addendum)
[facility] Gwinda Passe, NP [address] [place], [place] [address] [phone]    Abnormal Uterine Bleeding  Abnormal uterine bleeding is unusual bleeding from the uterus. It includes bleeding after sex, or bleeding or spotting between menstrual periods. It may also include bleeding that is heavier than normal, menstrual periods that last longer than usual, or bleeding that occurs after menopause. Abnormal uterine bleeding can affect teenagers, women in their reproductive years, pregnant women, and women who have reached menopause. Common causes of abnormal uterine bleeding include:  Pregnancy.  Growths of tissue (polyps).  Benign tumors or growths in the uterus (fibroids). These are not cancer.  Infection.  Cancer.  Too much or too little of some hormones in the body (hormonal imbalances). Any type of abnormal bleeding should be checked by a health care provider. Many cases are minor and simple to treat, but others may be more serious. Treatment will depend on the cause and severity of the bleeding. Follow these instructions at home: Medicines  Take over-the-counter and prescription medicines only as told by your health care provider.  Tell your health care provider about other medicines that you take. You may be asked to stop taking aspirin or medicines that contain aspirin. These medicines can make bleeding worse.  If you were prescribed iron pills, take them as told by your health care provider. Iron pills help to replace iron that your body loses because of this condition. Managing constipation In cases of severe bleeding, you may be asked to increase your iron intake to treat anemia. This may cause constipation. To prevent or treat constipation, you may need to:  Drink enough fluid to keep your urine pale yellow.  Take over-the-counter or prescription medicines.  Eat foods that are high in fiber, such as beans, whole grains, and fresh fruits  and vegetables.  Limit foods that are high in fat and processed sugars, such as fried or sweet foods. General instructions  Monitor your condition for any changes.  Do not use tampons, douche, or have sex until your health care provider says these things are okay.  Change your pads often.  Get regular exams. This includes pelvic exams and cervical cancer screenings. ? It is up to you to get the results of any tests that are done. Ask your health care provider, or the department that is doing the tests, when your results will be ready.  Keep all follow-up visits as told by your health care provider. This is important. Contact a health care provider if you:  Have bleeding that lasts for more than 1 week.  Feel dizzy at times.  Feel nauseous or you vomit.  Feel light-headed or weak.  Notice any other changes that show that your condition is getting worse. Get help right away if you:  Pass out.  Have bleeding that soaks through a pad every hour.  Have pain in the abdomen.  Have a fever or chills.  Become sweaty or weak.  Pass large blood clots from your vagina. Summary  Abnormal uterine bleeding is unusual bleeding from the uterus.  Any type of abnormal bleeding should be evaluated by a health care provider. Many cases are minor and simple to treat, but others may be more serious.  Treatment will depend on the cause of the bleeding.  Get help right away if you pass out, you have bleeding that soaks through a pad every hour, or you pass large blood clots from your vagina. This information is not intended to  replace advice given to you by your health care provider. Make sure you discuss any questions you have with your health care provider. Document Revised: 04/05/2020 Document Reviewed: 06/01/2019 Elsevier Patient Education  2021 [employer].

## 2020-11-07 ENCOUNTER — Ambulatory Visit: Payer: Medicaid Other

## 2020-11-10 DIAGNOSIS — Z419 Encounter for procedure for purposes other than remedying health state, unspecified: Secondary | ICD-10-CM | POA: Diagnosis not present

## 2020-12-10 DIAGNOSIS — Z419 Encounter for procedure for purposes other than remedying health state, unspecified: Secondary | ICD-10-CM | POA: Diagnosis not present

## 2020-12-11 ENCOUNTER — Ambulatory Visit (HOSPITAL_COMMUNITY)
Admission: EM | Admit: 2020-12-11 | Discharge: 2020-12-11 | Disposition: A | Discharge status: Home / Self Care | Payer: Medicaid Other | Attending: Family Medicine | Admitting: Family Medicine

## 2020-12-11 ENCOUNTER — Other Ambulatory Visit: Payer: Self-pay

## 2020-12-11 ENCOUNTER — Encounter (HOSPITAL_COMMUNITY): Payer: Self-pay

## 2020-12-11 DIAGNOSIS — N760 Acute vaginitis: Secondary | ICD-10-CM | POA: Insufficient documentation

## 2020-12-11 NOTE — ED Provider Notes (Signed)
[facility]    CSN: [id number] Arrival date & time: 12/11/20  0820      History   Chief Complaint Chief Complaint  Patient presents with  . Vaginal Itching    HPI Shelly Gibbs is a 35 y.o. female.   Patient presenting today with 3-week history of off-and-on brown vaginal discharge, vaginal itching, odor.  She denies fever, chills, nausea, vomiting, abdominal pain or pelvic pain, rashes, lesions, known exposures to STDs though does want to be screened today for these.  So far trying some MetroGel that she had at home leftover from a past BV infection with mild temporary relief.     Past Medical History:  Diagnosis Date  . Asthma    inhaler last used "long time ago"  . BV (bacterial vaginosis)   . Chlamydia   . Gonorrhea 08-03-2012  . Headache(784.0)   . Trichomonas infection   . UTI (lower urinary tract infection)   . Yeast infection     Patient Active Problem List   Diagnosis Date Noted  . ASCUS of cervix with negative high risk HPV 06/08/2020  . Visit for wound check 10/28/2016  . Indication for care in labor or delivery 12/20/2013  . Pregnancy 12/07/2013  . Bacterial vaginosis 07/30/2012  . Impetigo 07/30/2012    Past Surgical History:  Procedure Laterality Date  . DILATION AND CURETTAGE OF UTERUS    . INDUCED ABORTION     x 2    OB History    Gravida  9   Para  2   Term  2   Preterm  0   AB  6   Living  2     SAB  1   IAB  5   Ectopic  0   Multiple  0   Live Births  2            Home Medications    Prior to Admission medications   Medication Sig Start Date End Date Taking? Authorizing Provider  ELDERBERRY PO Take by mouth.    [provider]  Norethindrone Acetate-Ethinyl Estrad-FE (LOESTRIN 24 FE) 1-20 MG-MCG(24) tablet Take 1 tablet by mouth daily. 10/12/20   Warden Fillers, MD    Family History Family History  Problem Relation Age of Onset  . Diabetes Maternal Aunt   . Anesthesia problems Neg  Hx   . Other Neg Hx   . Hearing loss Neg Hx     Social History Social History   Tobacco Use  . Smoking status: Never Smoker  . Smokeless tobacco: Never Used  Vaping Use  . Vaping Use: Never used  Substance Use Topics  . Alcohol use: Yes    Comment: occasional   . Drug use: No     Allergies   Latex, Percocet [oxycodone-acetaminophen], and Tylenol [acetaminophen]   Review of Systems Review of Systems Per HPI  Physical Exam Triage Vital Signs ED Triage Vitals  Enc Vitals Group     BP 12/11/20 0851 (!) 144/98     Pulse Rate 12/11/20 0851 64     Resp 12/11/20 0851 18     Temp 12/11/20 0851 98.2 F (36.8 C)     Temp Source 12/11/20 0851 Oral     SpO2 12/11/20 0851 100 %     Weight --      Height --      Head Circumference --      Peak Flow --      Pain Score  12/11/20 0850 0     Pain Loc --      Pain Edu? --      Excl. in GC? --    No data found.  Updated Vital Signs BP (!) 144/98 (BP Location: Right Arm)   Pulse 64   Temp 98.2 F (36.8 C) (Oral)   Resp 18   SpO2 100%   Visual Acuity Right Eye Distance:   Left Eye Distance:   Bilateral Distance:    Right Eye Near:   Left Eye Near:    Bilateral Near:     Physical Exam Vitals and nursing note reviewed.  Constitutional:      Appearance: Normal appearance. She is not ill-appearing.  HENT:     Head: Atraumatic.     Mouth/Throat:     Mouth: Mucous membranes are moist.     Pharynx: Oropharynx is clear.  Eyes:     Extraocular Movements: Extraocular movements intact.     Conjunctiva/sclera: Conjunctivae normal.  Cardiovascular:     Rate and Rhythm: Normal rate and regular rhythm.     Heart sounds: Normal heart sounds.  Pulmonary:     Effort: Pulmonary effort is normal.     Breath sounds: Normal breath sounds.  Abdominal:     General: Bowel sounds are normal. There is no distension.     Palpations: Abdomen is soft.     Tenderness: There is no abdominal tenderness. There is no right CVA  tenderness, left CVA tenderness or guarding.  Genitourinary:    Comments: GU exam deferred, self swab performed Musculoskeletal:        General: Normal range of motion.     Cervical back: Normal range of motion and neck supple.  Skin:    General: Skin is warm and dry.  Neurological:     Mental Status: She is alert and oriented to person, place, and time.  Psychiatric:        Mood and Affect: Mood normal.        Thought Content: Thought content normal.        Judgment: Judgment normal.     UC Treatments / Results  Labs (all labs ordered are listed, but only abnormal results are displayed) Labs Reviewed  CERVICOVAGINAL ANCILLARY ONLY    EKG   Radiology No results found.  Procedures Procedures (including critical care time)  Medications Ordered in UC Medications - No data to display  Initial Impression / Assessment and Plan / UC Course  I have reviewed the triage vital signs and the nursing notes.  Pertinent labs & imaging results that were available during my care of the patient were reviewed by me and considered in my medical decision making (see chart for details).     Vaginal swab pending, will treat based on these results.  Discussed probiotics, boric acid suppositories, good vaginal hygiene in the meantime.  Abstinence recommended until results return.  Final Clinical Impressions(s) / UC Diagnoses   Final diagnoses:  Acute vaginitis   Discharge Instructions   None    ED Prescriptions    None     PDMP not reviewed this encounter.   Roosvelt Maser [place], [place] 12/11/20 [phone]

## 2020-12-11 NOTE — ED Triage Notes (Signed)
Pt reports vaginal itching and brown vaginal discharge x 3 weeks.

## 2020-12-12 ENCOUNTER — Telehealth (HOSPITAL_COMMUNITY): Payer: Self-pay | Admitting: Emergency Medicine

## 2020-12-12 LAB — CERVICOVAGINAL ANCILLARY ONLY
Bacterial Vaginitis (gardnerella): POSITIVE — AB
Candida Glabrata: NEGATIVE
Candida Vaginitis: POSITIVE — AB
Chlamydia: NEGATIVE
Comment: NEGATIVE
Comment: NEGATIVE
Comment: NEGATIVE
Comment: NEGATIVE
Comment: NEGATIVE
Comment: NORMAL
Neisseria Gonorrhea: NEGATIVE
Trichomonas: NEGATIVE

## 2020-12-12 MED ORDER — FLUCONAZOLE 150 MG PO TABS: 150.0000 mg | ORAL_TABLET | Freq: Once | ORAL | 0 refills | Status: AC

## 2020-12-12 MED ORDER — METRONIDAZOLE 0.75 % VA GEL: 1.0000 | Freq: Every day | VAGINAL | 0 refills | Status: AC

## 2021-01-10 DIAGNOSIS — Z419 Encounter for procedure for purposes other than remedying health state, unspecified: Secondary | ICD-10-CM | POA: Diagnosis not present

## 2021-01-18 ENCOUNTER — Ambulatory Visit (INDEPENDENT_AMBULATORY_CARE_PROVIDER_SITE_OTHER): Payer: Medicaid Other

## 2021-01-18 ENCOUNTER — Other Ambulatory Visit: Payer: Self-pay

## 2021-01-18 VITALS — BP 121/99 | HR 78 | Ht 62.0 in | Wt 184.1 lb

## 2021-01-18 DIAGNOSIS — R030 Elevated blood-pressure reading, without diagnosis of hypertension: Secondary | ICD-10-CM

## 2021-01-18 DIAGNOSIS — Z3042 Encounter for surveillance of injectable contraceptive: Secondary | ICD-10-CM | POA: Diagnosis not present

## 2021-01-18 MED ORDER — BLOOD PRESSURE KIT DEVI: 1.0000 | 0 refills | Status: DC

## 2021-01-18 MED ORDER — MEDROXYPROGESTERONE ACETATE 150 MG/ML IM SUSP
150.0000 mg | Freq: Once | INTRAMUSCULAR | Status: AC
  Administered 2021-01-18: 150 mg via INTRAMUSCULAR

## 2021-01-18 NOTE — Patient Instructions (Signed)
AREA FAMILY PRACTICE PHYSICIANS  [place] ([id number]) [facility] [address] [place], [place] [address] [phone] Mon-Fri 8:30-12:30, 1:30-5:00 Accepting [facility] at [facility] [address], [place], [place] [address] [phone] Mon-Fri 8:00-5:30 [facility] [address] [place], [place] [address] [phone] [place], Thur, Fri 8:30-5:00, Wed 10:00-7:00 (closed 1-2pm) Accepting [facility] [address], [place], [place]  [address] Phone - [phone]   Fax - [phone]  [place] [phone]) [facility] [address] [place], [place] [address] [phone] Mon-Fri 8:00-5:00 [facility] Pediatrics at [facility])  [address] [place], [place] [address] [phone] Mon-Fri 8:30-5:30, Sat (Oct.-Mar.) 9:00-1:00 Accepting Medicaid  [place] [phone]) [facility] [address] [place], [place] [address] [phone] Mon-Fri 8:00-5:00  [place] [phone]) [facility] at [facility] [address], [place], [place] [address] [phone] Mon-Fri 8:00-5:00 [facility] [place] [address] [place], [place], [place] [address] [phone] Mon-Fri 8:00-5:00 [facility] [address] [place], [place] [address] [phone] Mon-Fri 8:00-5:00 [facility] [address] [place] [address] [phone] Mon-Fri 7:30-5:30  [place] [phone] & [phone]) [facility] [address], [place], [place] [address] [phone] Mon-Thur 8:00-6:00 Accepting [facility] [address] [place], [place] [address] [phone] Mon-Thur 7:30-7:30, Fri 7:30-4:30 Accepting  [facility] at [facility] [address] [place], [place]  [address] [phone]   Fax - [phone]

## 2021-01-18 NOTE — Progress Notes (Signed)
Lakrista L Borkenhagen here for Depo-Provera Injection. Injection administered without complication. Patient will return in 3 months for next injection between Aug 25 and Sept 8, 2022. Next annual visit due Oct 2022.   Isabell Jarvis, RN 01/18/2021  9:47 AM  Pt states having nausea and headaches at night for past 3 days. Denies any other symptoms. Denies any vaginal bleeding or cramps since last injection. Pt states has family hx of HTN. Pt BP today is 121/99. Pt states was changed from OCPs to Depo in March 2022 due to elevated BP. Pt states is going to make PCP follow up at [facility]. Pt also given list of PCPs near her to make appt. We discussed importance of going to PCP for BP. Pt also needing BP cuff. Rx sent to pharmacy at [facility] to pick up today. Pt agreeable to plan of care.   Judeth Cornfield, RN

## 2021-01-18 NOTE — Addendum Note (Signed)
Addended by: Isabell Jarvis on: 01/18/2021 10:19 AM   Modules accepted: Orders

## 2021-01-26 NOTE — Progress Notes (Signed)
Patient was assessed and managed by nursing staff during this encounter. I have reviewed the chart and agree with the documentation and plan. I have also made any necessary editorial changes.   Bing, MD 01/26/2021 3:09 PM

## 2021-02-09 DIAGNOSIS — Z419 Encounter for procedure for purposes other than remedying health state, unspecified: Secondary | ICD-10-CM | POA: Diagnosis not present

## 2021-03-12 DIAGNOSIS — Z419 Encounter for procedure for purposes other than remedying health state, unspecified: Secondary | ICD-10-CM | POA: Diagnosis not present

## 2021-04-05 ENCOUNTER — Other Ambulatory Visit: Payer: Self-pay

## 2021-04-05 ENCOUNTER — Ambulatory Visit (INDEPENDENT_AMBULATORY_CARE_PROVIDER_SITE_OTHER): Payer: Medicaid Other

## 2021-04-05 VITALS — BP 120/80 | HR 90 | Ht 62.0 in | Wt 188.9 lb

## 2021-04-05 DIAGNOSIS — Z3042 Encounter for surveillance of injectable contraceptive: Secondary | ICD-10-CM | POA: Diagnosis not present

## 2021-04-05 MED ORDER — MEDROXYPROGESTERONE ACETATE 150 MG/ML IM SUSP
150.0000 mg | Freq: Once | INTRAMUSCULAR | Status: AC
  Administered 2021-04-05: 150 mg via INTRAMUSCULAR

## 2021-04-05 NOTE — Progress Notes (Signed)
Chart reviewed for nurse visit. Agree with plan of care.   Venia Carbon I, NP 04/05/2021 1:21 PM

## 2021-04-05 NOTE — Progress Notes (Signed)
Shelly Gibbs here for Depo-Provera Injection. Injection administered without complication. Patient will return in 3 months for next injection between Nov 10 and Jul 05, 2021. Next annual visit due Oct 2022.   Isabell Jarvis, RN 04/05/2021  9:33 AM

## 2021-04-12 DIAGNOSIS — Z419 Encounter for procedure for purposes other than remedying health state, unspecified: Secondary | ICD-10-CM | POA: Diagnosis not present

## 2021-05-12 DIAGNOSIS — Z419 Encounter for procedure for purposes other than remedying health state, unspecified: Secondary | ICD-10-CM | POA: Diagnosis not present

## 2021-06-11 ENCOUNTER — Ambulatory Visit: Payer: Medicaid Other | Admitting: Certified Nurse Midwife

## 2021-06-12 DIAGNOSIS — Z419 Encounter for procedure for purposes other than remedying health state, unspecified: Secondary | ICD-10-CM | POA: Diagnosis not present

## 2021-06-14 ENCOUNTER — Ambulatory Visit (INDEPENDENT_AMBULATORY_CARE_PROVIDER_SITE_OTHER): Payer: Medicaid Other | Admitting: Medical

## 2021-06-14 ENCOUNTER — Other Ambulatory Visit: Payer: Self-pay

## 2021-06-14 ENCOUNTER — Encounter: Payer: Self-pay | Admitting: Medical

## 2021-06-14 ENCOUNTER — Other Ambulatory Visit (HOSPITAL_COMMUNITY)
Admission: RE | Admit: 2021-06-14 | Discharge: 2021-06-14 | Disposition: A | Discharge status: Home / Self Care | Payer: Medicaid Other | Source: Ambulatory Visit | Attending: Certified Nurse Midwife | Admitting: Certified Nurse Midwife

## 2021-06-14 VITALS — BP 134/93 | HR 66 | Wt 192.4 lb

## 2021-06-14 DIAGNOSIS — N898 Other specified noninflammatory disorders of vagina: Secondary | ICD-10-CM

## 2021-06-14 DIAGNOSIS — Z01419 Encounter for gynecological examination (general) (routine) without abnormal findings: Secondary | ICD-10-CM

## 2021-06-14 DIAGNOSIS — Z3042 Encounter for surveillance of injectable contraceptive: Secondary | ICD-10-CM

## 2021-06-14 MED ORDER — MEDROXYPROGESTERONE ACETATE 150 MG/ML IM SUSP
150.0000 mg | Freq: Once | INTRAMUSCULAR | Status: AC
  Administered 2021-06-14: 150 mg via INTRAMUSCULAR

## 2021-06-14 NOTE — Progress Notes (Signed)
   History:  [name] is a 35 y.o. [id number] who presents to clinic today for annual exam and Depo provera injection. She states that she had BV and yeast earlier this year and was treated but she continues to have discharge. She has not been sexually active since April. She no longer has periods with the Depo provera. Last pap smear was 06/05/2020 ASCUS with negative HPV. She denies abdominal pain, vaginal bleeding or breast concerns.    The following portions of the patient's history were reviewed and updated as appropriate: allergies, current medications, family history, past medical history, social history, past surgical history and problem list.  Review of Systems:  Review of Systems  Constitutional:  Negative for fever and malaise/fatigue.  Gastrointestinal:  Negative for abdominal pain, constipation, diarrhea, nausea and vomiting.  Genitourinary:  Negative for dysuria, frequency and urgency.       + vaginal discharge Neg - vaginal bleeding     Objective:  Physical Exam BP (!) 134/93   Pulse 66   Wt 192 lb 6.4 oz (87.3 kg)   BMI 35.19 kg/m  Physical Exam Vitals and nursing note reviewed. Exam conducted with a chaperone present.  Constitutional:      General: She is not in acute distress.    Appearance: She is well-developed. She is obese.  HENT:     Head: Normocephalic and atraumatic.  Cardiovascular:     Rate and Rhythm: Normal rate and regular rhythm.     Heart sounds: No murmur heard. Pulmonary:     Effort: Pulmonary effort is normal. No respiratory distress.     Breath sounds: Normal breath sounds.  Chest:  Breasts:    Breasts are symmetrical.     Right: Normal.     Left: Normal.  Abdominal:     General: Bowel sounds are normal. There is no distension.     Palpations: Abdomen is soft. There is no mass.     Tenderness: There is no abdominal tenderness. There is no guarding or rebound.  Genitourinary:    General: Normal vulva.     Vagina: No vaginal  discharge or bleeding.     Cervix: No cervical motion tenderness, discharge or friability.     Uterus: Not enlarged and not tender.      Adnexa:        Right: No mass or tenderness.         Left: No mass or tenderness.    Skin:    General: Skin is warm and dry.     Findings: No erythema.  Neurological:     Mental Status: She is alert and oriented to person, place, and time.  Psychiatric:        Mood and Affect: Mood normal.     Health Maintenance Due  Topic Date Due   TETANUS/TDAP  Never done   INFLUENZA VACCINE  Never done     Assessment & Plan:  1. Vaginal discharge - Self-swab today - Patient states allergy to Flagyl and ineffective treatment with Boric acid - Cervicovaginal ancillary only( [facility])  2. Surveillance for Depo-Provera contraception - medroxyPROGESTERone (DEPO-PROVERA) injection 150 mg  3. ASCUS pap smear - ASCUS pap 06/05/20 - Per [facility], repeat cotesting in 3 year  4. Annual Gyn Exam  Approximately 15 minutes of total time was spent with this patient on history taking, patient education, physical exam and documentation.   Marny Lowenstein, PA-C 06/14/2021 3:41 PM

## 2021-06-15 LAB — CERVICOVAGINAL ANCILLARY ONLY
Bacterial Vaginitis (gardnerella): NEGATIVE
Candida Glabrata: NEGATIVE
Candida Vaginitis: NEGATIVE
Chlamydia: NEGATIVE
Comment: NEGATIVE
Comment: NEGATIVE
Comment: NEGATIVE
Comment: NEGATIVE
Comment: NEGATIVE
Comment: NORMAL
Neisseria Gonorrhea: NEGATIVE
Trichomonas: NEGATIVE

## 2021-06-21 ENCOUNTER — Ambulatory Visit: Payer: Medicaid Other

## 2021-07-12 DIAGNOSIS — Z419 Encounter for procedure for purposes other than remedying health state, unspecified: Secondary | ICD-10-CM | POA: Diagnosis not present

## 2021-08-12 DIAGNOSIS — Z419 Encounter for procedure for purposes other than remedying health state, unspecified: Secondary | ICD-10-CM | POA: Diagnosis not present

## 2021-08-30 ENCOUNTER — Ambulatory Visit: Payer: Medicaid Other

## 2021-09-07 ENCOUNTER — Other Ambulatory Visit: Payer: Self-pay

## 2021-09-07 ENCOUNTER — Ambulatory Visit (INDEPENDENT_AMBULATORY_CARE_PROVIDER_SITE_OTHER): Payer: Medicaid Other

## 2021-09-07 VITALS — BP 131/93 | HR 65

## 2021-09-07 DIAGNOSIS — Z3042 Encounter for surveillance of injectable contraceptive: Secondary | ICD-10-CM

## 2021-09-07 MED ORDER — MEDROXYPROGESTERONE ACETATE 150 MG/ML IM SUSP
150.0000 mg | Freq: Once | INTRAMUSCULAR | Status: AC
  Administered 2021-09-07: 150 mg via INTRAMUSCULAR

## 2021-09-07 NOTE — Progress Notes (Signed)
Shelly Gibbs here for Depo-Provera Injection. Injection administered without complication. Patient will return in 3 months for next injection between 11/23/21 and 12/07/21. Next annual visit due November 2023.   Patient reports concerns about weight gain on Depo Provera. Offered provider visit to talk about other options for contraception. Pt states she is considering a BTL but would like to do more research. Pt will call to schedule provider appt if desired.  BP elevated today. Pt reports multiple elevated BPs in recent history. Pt does not have PCP. Pt scheduled for new pt appt on 10/04/21 with [facility]  Marjo Bicker, RN 09/07/2021  9:58 AM

## 2021-09-07 NOTE — Progress Notes (Signed)
Chart reviewed for nurse visit. Agree with plan of care.   Marny Lowenstein, PA-C 09/07/2021 1:42 PM

## 2021-09-12 DIAGNOSIS — Z419 Encounter for procedure for purposes other than remedying health state, unspecified: Secondary | ICD-10-CM | POA: Diagnosis not present

## 2021-10-04 ENCOUNTER — Encounter: Payer: Self-pay | Admitting: Family

## 2021-10-04 ENCOUNTER — Other Ambulatory Visit: Payer: Self-pay

## 2021-10-04 ENCOUNTER — Ambulatory Visit (INDEPENDENT_AMBULATORY_CARE_PROVIDER_SITE_OTHER): Payer: Medicaid Other | Admitting: Family

## 2021-10-04 VITALS — BP 118/60 | HR 72 | Temp 98.2°F | Ht 62.0 in | Wt 197.8 lb

## 2021-10-04 DIAGNOSIS — N761 Subacute and chronic vaginitis: Secondary | ICD-10-CM | POA: Diagnosis not present

## 2021-10-04 DIAGNOSIS — N760 Acute vaginitis: Secondary | ICD-10-CM

## 2021-10-04 DIAGNOSIS — B369 Superficial mycosis, unspecified: Secondary | ICD-10-CM

## 2021-10-04 DIAGNOSIS — M6740 Ganglion, unspecified site: Secondary | ICD-10-CM

## 2021-10-04 DIAGNOSIS — F411 Generalized anxiety disorder: Secondary | ICD-10-CM | POA: Diagnosis not present

## 2021-10-04 DIAGNOSIS — B9689 Other specified bacterial agents as the cause of diseases classified elsewhere: Secondary | ICD-10-CM

## 2021-10-04 MED ORDER — BUPROPION HCL ER (SR) 100 MG PO TB12: 100.0000 mg | ORAL_TABLET | Freq: Two times a day (BID) | ORAL | 0 refills | Status: DC

## 2021-10-04 MED ORDER — CLOTRIMAZOLE 1 % EX CREA: 1.0000 "application " | TOPICAL_CREAM | Freq: Two times a day (BID) | CUTANEOUS | 1 refills | Status: DC

## 2021-10-04 MED ORDER — BORIC ACID VAGINAL 600 MG VA SUPP: 1.0000 | Freq: Every evening | VAGINAL | 1 refills | Status: DC

## 2021-10-04 NOTE — Assessment & Plan Note (Addendum)
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 discussed options, pt would like counseling and to start Bupropion, advised on use & SE, f/u in 1 month.

## 2021-10-04 NOTE — Assessment & Plan Note (Signed)
Recurrent, reports brownish, thin discharge, no irritation or burning, or foul odor symptoms. Has used boric acid in past, but said she couldn't keep up with the dosing as different every day. Sending refill today and advised on how to use.

## 2021-10-04 NOTE — Patient Instructions (Addendum)
Welcome to [facility]! It was a pleasure meeting you today.  As discussed, I have sent a prescription for vaginal boric acid suppositories, use these for 3 nights only to help clear up your vaginal discharge. I also sent an antifungal cream to help the skin rash on your groin. Clean area well, dry well, then apply cream. Remember fungal infections are from moisture, so keep the area as dry as possible and limit tight fitting clothes that trap the moisture! I have sent a referral to Orthopedic office for your right wrist pain and cyst.  I have sent Bupropion to take daily for your anxiety, please schedule a 1 month f/u visit today.     PLEASE NOTE:  If you had any LAB tests please let us know if you have not heard back within a few days. You may see your results on MyChart before we have a chance to review them but we will give you a call once they are reviewed by [place]. If we ordered any REFERRALS today, please let us know if you have not heard from their office within the next week.  Let us know through MyChart if you are needing REFILLS, or have your pharmacy send [place] the request. You can also use MyChart to communicate with me or any office staff.  Please try these tips to maintain a healthy lifestyle:  Eat most of your calories during the day when you are active. Eliminate processed foods including packaged sweets (pies, cakes, cookies), reduce intake of potatoes, white bread, white pasta, and white rice. Look for whole grain options, oat flour or almond flour.  Each meal should contain half fruits/vegetables, one quarter protein, and one quarter carbs (no bigger than a computer mouse).  Cut down on sweet beverages. This includes juice, soda, and sweet tea. Also watch fruit intake, though this is a healthier sweet option, it still contains natural sugar! Limit to 3 servings daily.  Drink at least 1 glass of water with each meal and aim for at least 8 glasses per  day  Exercise at least 150 minutes every week.

## 2021-10-04 NOTE — Progress Notes (Signed)
New Patient Office Visit  Subjective:  Patient ID: Shelly Gibbs, female    DOB: 10/09/1985  Age: 36 y.o. MRN: [id number]  CC:  Chief Complaint  Patient presents with   Hypertension   Cyst    Right hand; 2016, Soreness started a couple of weeks ago. 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     Vaginal Discharge    Recurrent discharge, Yeast/BV. She says that she has not been sexual active since last April. Pt describes a brownish color.   HPI Shelly Gibbs presents for establishing care and to discuss several problems. Skin rash & cyst: Patient complains of a cyst located on the top of her hand near her thumb. Pt denies any drainage, reports tenderness. The rash is located on her bilateral groin. She c/o itching and irritation, redness. It has been present for a  week. Pain is rated 0/10. Interventions to date: none.   Anxiety/Depression: Patient complains of anxiety disorder.   She has the following symptoms: difficulty concentrating, feelings of losing control, irritable, palpitations, racing thoughts, sweating.  Onset of symptoms was approximately  months ago, She denies current suicidal and homicidal ideation.  Possible organic causes contributing are: none.  Risk factors: none  Previous treatment includes nothing, never evaluated.   Depression screen [facility] 2/9 10/04/2021  Decreased Interest 0  Down, Depressed, Hopeless 0  PHQ - 2 Score 0  Altered sleeping -  Tired, decreased energy -  Change in appetite -  Feeling bad or failure about yourself  -  Trouble concentrating -  Moving slowly or fidgety/restless -  Suicidal thoughts -  PHQ-9 Score -  Difficult doing work/chores -   GAD 7 : Generalized Anxiety Score 10/04/2021  Nervous, Anxious, on Edge 1  Control/stop worrying 1  Worry too much - different things 1  Trouble relaxing 1  Restless 0  Easily annoyed or irritable 1  Afraid - awful might happen 0   Total GAD 7 Score 5  Anxiety Difficulty Not difficult at all   Vaginitis: Patient complains of an abnormal vaginal discharge off and on for months. Vaginal symptoms include no odor, itching, or pain STI Risk/HX: no sexual activity since last April Discharge described as:  thin, brownish color Other associated symptoms: denies pelvic pain Menstrual pattern: just mild spotting  Contraception: DEPO    Past Medical History:  Diagnosis Date   Asthma    inhaler last used "long time ago"   BV (bacterial vaginosis)    Chlamydia    Gonorrhea 08/03/2012   Headache(784.0)    Indication for care in labor or delivery 12/20/2013   Pregnancy 12/07/2013   Trichomonas infection    UTI (lower urinary tract infection)    Visit for wound check 10/28/2016   Yeast infection    Past Surgical History:  Procedure Laterality Date   DILATION AND CURETTAGE OF UTERUS     INDUCED ABORTION     x 2   Family History  Problem Relation Age of Onset   Diabetes Maternal Aunt    Anesthesia problems Neg Hx    Other Neg Hx    Hearing loss Neg Hx     Social History   Socioeconomic History   Marital status: Single    Spouse name: Not on file   Number of children: Not on file   Years of education: Not on file   Highest education level: Not  on file  Occupational History   Not on file  Tobacco Use   Smoking status: Never   Smokeless tobacco: Never  Vaping Use   Vaping Use: Never used  Substance and Sexual Activity   Alcohol use: Yes    Comment: occasional    Drug use: No   Sexual activity: Yes    Birth control/protection: Injection  Other Topics Concern   Not on file  Social History Narrative   Not on file   Social Determinants of Health   Financial Resource Strain: Not on file  Food Insecurity: Not on file  Transportation Needs: Not on file  Physical Activity: Not on file  Stress: Not on file  Social Connections: Not on file  Intimate Partner Violence: Not on file    Objective:    Today's Vitals: BP 118/60    Pulse 72    Temp 98.2 F (36.8 C) (Temporal)    Ht 5\' 2"  (1.575 m)    Wt 197 lb 12.8 oz (89.7 kg)    LMP  (LMP Unknown)    SpO2 99%    BMI 36.18 kg/m   Physical Exam Vitals and nursing note reviewed.  Constitutional:      Appearance: Normal appearance.  Cardiovascular:     Rate and Rhythm: Normal rate and regular rhythm.  Pulmonary:     Effort: Pulmonary effort is normal.     Breath sounds: Normal breath sounds.  Musculoskeletal:     Right wrist: Swelling (cyst, dorsal hand near thumb) and tenderness (around the thumb) present. Decreased range of motion.       Arms:  Skin:    General: Skin is warm and dry.  Neurological:     Mental Status: She is alert.  Psychiatric:        Mood and Affect: Mood normal.        Behavior: Behavior normal.    Assessment & Plan:      Problem List Items Addressed This Visit       Genitourinary   Bacterial vaginosis    Recurrent, reports brownish, thin discharge, no irritation or burning, or foul odor symptoms. Has used boric acid in past, but said she couldn't keep up with the dosing as different every day. Sending refill today and advised on how to use.      Relevant Medications   Boric Acid Vaginal 600 MG SUPP        Other   Generalized anxiety disorder - Primary    Chronic, but never evaluated - works , and going to school. discussed options, pt would like counseling and to start Bupropion, advised on use & SE, f/u in 1 month.      Relevant Medications   buPROPion ER (WELLBUTRIN SR) 100 MG 12 hr tablet   Other Relevant Orders   Ambulatory referral to Psychology   Other Visit Diagnoses     Fungal skin infection       Relevant Medications   clotrimazole (LOTRIMIN) 1 % cream   Ganglion cyst       on dorsal hand, near thumb. pt also c/o thumb, snuffbox area swelling & tenderness. pt works as a Glass blower/designer. Referring to [facility]  Relevant Orders   Ambulatory referral to Orthopedic  Surgery           *Extra time ([facility] [place]) spent with patient today which consisted of chart review, discussing diagnoses, work up, treatment, answering questions, and documentation.    Outpatient Encounter Medications as of 10/04/2021  Medication  Sig   Boric Acid Vaginal 600 MG SUPP Place 1 each vaginally at bedtime. For 3 nights only   buPROPion ER (WELLBUTRIN SR) 100 MG 12 hr tablet Take 1 tablet (100 mg total) by mouth 2 (two) times daily. Start with one pill in the morning for 3-4 days, then increase to twice a day.   clotrimazole (LOTRIMIN) 1 % cream Apply 1 application topically 2 (two) times daily.   No facility-administered encounter medications on file as of 10/04/2021.    Follow-up: Return in about 4 weeks (around 11/01/2021) for anxiety.   Dulce Sellar, NP

## 2021-10-10 DIAGNOSIS — Z419 Encounter for procedure for purposes other than remedying health state, unspecified: Secondary | ICD-10-CM | POA: Diagnosis not present

## 2021-10-11 ENCOUNTER — Ambulatory Visit: Payer: Medicaid Other | Admitting: Orthopedic Surgery

## 2021-10-31 ENCOUNTER — Ambulatory Visit: Payer: Medicaid Other | Admitting: Family

## 2021-11-10 DIAGNOSIS — Z419 Encounter for procedure for purposes other than remedying health state, unspecified: Secondary | ICD-10-CM | POA: Diagnosis not present

## 2021-11-23 ENCOUNTER — Other Ambulatory Visit (HOSPITAL_COMMUNITY)
Admission: RE | Admit: 2021-11-23 | Discharge: 2021-11-23 | Disposition: A | Discharge status: Home / Self Care | Payer: Medicaid Other | Source: Ambulatory Visit | Attending: Family Medicine | Admitting: Family Medicine

## 2021-11-23 ENCOUNTER — Ambulatory Visit (INDEPENDENT_AMBULATORY_CARE_PROVIDER_SITE_OTHER): Payer: Medicaid Other | Admitting: *Deleted

## 2021-11-23 VITALS — BP 125/97 | HR 71 | Ht 62.0 in | Wt 200.9 lb

## 2021-11-23 DIAGNOSIS — N898 Other specified noninflammatory disorders of vagina: Secondary | ICD-10-CM | POA: Insufficient documentation

## 2021-11-23 NOTE — Progress Notes (Signed)
Pt declined scheduled Depo Provera injection.  She is concerned with weight gain and no longer wants this form of birth control. Pt declines other forms of medical birth control and requests appointment with MD to discuss BTL. Pt advised to use condoms for pregnancy prevention until the appointment. Pt c/o constant brown vaginal discharge with occasional itching since November. Self swab obtained for wet prep. She will be notified of results via Mychart. Pt requests Rx for Metrogel if she has BV as the pill form causes extreme nausea and vomiting.  Last Pap 06/05/20 (ASCUS).  Annual Gyn exam due November 2023. She voiced understanding of all information and instructions given.  ?

## 2021-11-26 LAB — CERVICOVAGINAL ANCILLARY ONLY
Bacterial Vaginitis (gardnerella): POSITIVE — AB
Candida Glabrata: POSITIVE — AB
Candida Vaginitis: NEGATIVE
Chlamydia: NEGATIVE
Comment: NEGATIVE
Comment: NEGATIVE
Comment: NEGATIVE
Comment: NEGATIVE
Comment: NEGATIVE
Comment: NORMAL
Neisseria Gonorrhea: NEGATIVE
Trichomonas: NEGATIVE

## 2021-11-27 ENCOUNTER — Other Ambulatory Visit: Payer: Self-pay | Admitting: Obstetrics and Gynecology

## 2021-11-27 MED ORDER — BORIC ACID CRYS: 600.0000 mg | CRYSTALS | Freq: Every day | 0 refills | Status: AC

## 2021-12-10 DIAGNOSIS — Z419 Encounter for procedure for purposes other than remedying health state, unspecified: Secondary | ICD-10-CM | POA: Diagnosis not present

## 2022-01-10 DIAGNOSIS — Z419 Encounter for procedure for purposes other than remedying health state, unspecified: Secondary | ICD-10-CM | POA: Diagnosis not present

## 2022-02-07 ENCOUNTER — Ambulatory Visit: Payer: Medicaid Other | Admitting: Obstetrics and Gynecology

## 2022-02-08 ENCOUNTER — Other Ambulatory Visit: Payer: Self-pay | Admitting: Family

## 2022-02-08 DIAGNOSIS — B9689 Other specified bacterial agents as the cause of diseases classified elsewhere: Secondary | ICD-10-CM

## 2022-02-08 DIAGNOSIS — N761 Subacute and chronic vaginitis: Secondary | ICD-10-CM

## 2022-02-09 DIAGNOSIS — Z419 Encounter for procedure for purposes other than remedying health state, unspecified: Secondary | ICD-10-CM | POA: Diagnosis not present

## 2022-03-12 DIAGNOSIS — Z419 Encounter for procedure for purposes other than remedying health state, unspecified: Secondary | ICD-10-CM | POA: Diagnosis not present

## 2022-04-12 DIAGNOSIS — Z419 Encounter for procedure for purposes other than remedying health state, unspecified: Secondary | ICD-10-CM | POA: Diagnosis not present

## 2022-05-06 ENCOUNTER — Encounter: Payer: Self-pay | Admitting: *Deleted

## 2022-05-12 DIAGNOSIS — Z419 Encounter for procedure for purposes other than remedying health state, unspecified: Secondary | ICD-10-CM | POA: Diagnosis not present

## 2022-06-12 DIAGNOSIS — Z419 Encounter for procedure for purposes other than remedying health state, unspecified: Secondary | ICD-10-CM | POA: Diagnosis not present

## 2022-07-12 DIAGNOSIS — Z419 Encounter for procedure for purposes other than remedying health state, unspecified: Secondary | ICD-10-CM | POA: Diagnosis not present

## 2022-07-25 ENCOUNTER — Encounter: Payer: Self-pay | Admitting: *Deleted

## 2022-08-12 DIAGNOSIS — Z419 Encounter for procedure for purposes other than remedying health state, unspecified: Secondary | ICD-10-CM | POA: Diagnosis not present

## 2022-08-29 ENCOUNTER — Ambulatory Visit (INDEPENDENT_AMBULATORY_CARE_PROVIDER_SITE_OTHER): Payer: Medicaid Other | Admitting: Obstetrics and Gynecology

## 2022-08-29 ENCOUNTER — Other Ambulatory Visit (HOSPITAL_COMMUNITY)
Admission: RE | Admit: 2022-08-29 | Discharge: 2022-08-29 | Disposition: A | Discharge status: Home / Self Care | Payer: Medicaid Other | Source: Ambulatory Visit | Attending: Obstetrics and Gynecology | Admitting: Obstetrics and Gynecology

## 2022-08-29 ENCOUNTER — Encounter: Payer: Self-pay | Admitting: Obstetrics and Gynecology

## 2022-08-29 VITALS — BP 128/93 | HR 80 | Wt 200.4 lb

## 2022-08-29 DIAGNOSIS — N898 Other specified noninflammatory disorders of vagina: Secondary | ICD-10-CM | POA: Insufficient documentation

## 2022-08-29 DIAGNOSIS — Z0000 Encounter for general adult medical examination without abnormal findings: Secondary | ICD-10-CM | POA: Diagnosis not present

## 2022-08-29 DIAGNOSIS — Z3202 Encounter for pregnancy test, result negative: Secondary | ICD-10-CM

## 2022-08-29 LAB — POCT PREGNANCY, URINE: Preg Test, Ur: NEGATIVE

## 2022-08-29 NOTE — Progress Notes (Signed)
ANNUAL EXAM Patient name: Shelly HALBERT MRN [id number]  Date of birth: 05/05/1986 Chief Complaint:   Gynecologic Exam  History of Present Illness:   Shelly Gibbs is a 37 y.o. [id number] being seen today for a routine annual exam.  Current complaints: vaginal discharge  Patient's last menstrual period was 08/07/2022 (exact date).  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   The pregnancy intention screening data noted above was reviewed. Potential methods of contraception were discussed. Was on depo but stopped last April and not interested in new [facility] method.   Currently sexually active, not using contraception and thinks she would like at least 1 more child. No issues with bowel or urinary habits.   Mild vaginal irritation. Using boric acid will use at the end of cycle, maybe about 3 days. Last rx given for boric felt that it worked very well. Has used other vaginal treatments for BV and that typically helps.  No breast or nipple changes.   Last pap     Component Value Date/Time   DIAGPAP (A) 06/05/2020 1006    - Atypical squamous cells of undetermined significance (ASC-US)   DIAGPAP  11/21/2016 0000    NEGATIVE FOR INTRAEPITHELIAL LESIONS OR MALIGNANCY. BENIGN REACTIVE/REPARATIVE CHANGES.   [place] Negative 06/05/2020 1006   ADEQPAP  06/05/2020 1006    Satisfactory for evaluation; transformation zone component PRESENT.   ADEQPAP  11/21/2016 0000    Satisfactory for evaluation  endocervical/transformation zone component PRESENT.   Last mammogram: n/a Last colonoscopy: n/a     08/29/2022   10:15 AM 10/04/2021    9:14 AM 01/18/2021   10:15 AM 11/02/2020    3:31 PM 08/21/2020    4:51 PM  Depression screen PHQ 2/9  Decreased Interest 0 0 0 0 0  Down, Depressed, Hopeless 0 0 0 0 0  PHQ - 2 Score 0 0 0 0 0  Altered sleeping 1  0 2 0  Tired, decreased energy 1  0 2 0  Change in appetite 0  0 0 0  Feeling bad or failure about  yourself  0  0 0 0  Trouble concentrating 0  0 0 0  Moving slowly or fidgety/restless 0  0 0 0  Suicidal thoughts 0  0 0 0  PHQ-9 Score 2  0 4 0  Difficult doing work/chores   Not difficult at all          08/29/2022   10:15 AM 10/04/2021   10:01 AM 01/18/2021   10:15 AM 11/02/2020    3:31 PM  GAD 7 : Generalized Anxiety Score  Nervous, Anxious, on Edge 1 1 0 0  Control/stop worrying 0 1 0 1  Worry too much - different things 0 1 0 0  Trouble relaxing 0 1 0 0  Restless 0 0 0 0  Easily annoyed or irritable 0 1 0 1  Afraid - awful might happen 0 0 0 0  Total GAD 7 Score 1 5 0 2  Anxiety Difficulty  Not difficult at all Not difficult at all      Review of Systems:   Pertinent items are noted in HPI Denies any headaches, blurred vision, fatigue, shortness of breath, chest pain, abdominal pain, abnormal vaginal discharge/itching/odor/irritation, problems with periods, bowel movements, urination, or intercourse unless otherwise stated above. Pertinent History Reviewed:  Reviewed past medical,surgical, social and family history.  Reviewed problem list, medications and allergies. Physical Assessment:  Vitals:   08/29/22 0959 08/29/22 1004  BP: (!) 129/91 (!) 128/93  Pulse: 85 80  Weight: 200 lb 6.4 oz (90.9 kg)   Body mass index is 36.65 kg/m.        Physical Examination:   General appearance - well appearing, and in no distress  Mental status - alert, oriented to person, place, and time  Psych:  She has a normal mood and affect  Skin - warm and dry, normal color, no suspicious lesions noted  Chest - effort normal, all lung fields clear to auscultation bilaterally  Heart - normal rate and regular rhythm Pelvic exam declined/deferred until return for pap  Extremities:  No swelling or varicosities noted  Chaperone present for exam  Results for orders placed or performed in visit on 08/29/22 (from the past 24 hour(s))  Pregnancy, urine POC   Collection Time: 08/29/22 10:10  AM  Result Value Ref Range   Preg Test, Ur NEGATIVE NEGATIVE      Assessment & Plan:  1. Well woman exam without gynecological exam Not currently on contraception - ok with getting pregnant again No other issues Return for pap and pelvic in fall   2. Vaginal discharge Self swab collected - Cervicovaginal ancillary only( [place])  Labs/procedures today: vaginitis  Mammogram: @ 37yo, or sooner if problems Colonoscopy: @ 37yo, or sooner if problems  Orders Placed This Encounter  Procedures   Pregnancy, urine POC    Meds: No orders of the defined types were placed in this encounter.   Follow-up: No follow-ups on file.  Darliss Cheney, MD 08/29/2022 10:43 AM

## 2022-09-03 ENCOUNTER — Telehealth: Payer: Self-pay

## 2022-09-03 ENCOUNTER — Other Ambulatory Visit: Payer: Self-pay | Admitting: Obstetrics and Gynecology

## 2022-09-03 DIAGNOSIS — N898 Other specified noninflammatory disorders of vagina: Secondary | ICD-10-CM

## 2022-09-03 DIAGNOSIS — B9689 Other specified bacterial agents as the cause of diseases classified elsewhere: Secondary | ICD-10-CM

## 2022-09-03 MED ORDER — METRONIDAZOLE 0.75 % VA GEL
1.0000 | Freq: Every day | VAGINAL | 1 refills | Status: DC
Start: 2022-09-03 — End: 2023-01-06

## 2022-09-03 NOTE — Telephone Encounter (Signed)
CMA faxed add on request for [facility] for STD testing on cervical ancillary swab to Cytology Department at [place]

## 2022-09-03 NOTE — Telephone Encounter (Addendum)
-----  Message from Darliss Cheney, MD sent at 09/03/2022  7:24 AM EST ----- Contact and inform of BV diagnosis. Rx sent for metrogel to use and can return to usual boric acid use  Called pt; VM left. MyChart message sent.

## 2022-09-04 LAB — CERVICOVAGINAL ANCILLARY ONLY
Bacterial Vaginitis (gardnerella): POSITIVE — AB
Candida Glabrata: NEGATIVE
Candida Vaginitis: NEGATIVE
Chlamydia: NEGATIVE
Comment: NEGATIVE
Comment: NEGATIVE
Comment: NEGATIVE
Comment: NEGATIVE
Comment: NEGATIVE
Comment: NORMAL
Neisseria Gonorrhea: NEGATIVE
Trichomonas: NEGATIVE

## 2022-09-12 DIAGNOSIS — Z419 Encounter for procedure for purposes other than remedying health state, unspecified: Secondary | ICD-10-CM | POA: Diagnosis not present

## 2022-09-17 MED ORDER — FLUCONAZOLE 150 MG PO TABS: 150.0000 mg | ORAL_TABLET | Freq: Every day | ORAL | 0 refills | Status: DC

## 2022-09-17 NOTE — Addendum Note (Signed)
Addended by: Annabell Howells on: 09/17/2022 12:29 PM   Modules accepted: Orders

## 2022-10-11 DIAGNOSIS — Z419 Encounter for procedure for purposes other than remedying health state, unspecified: Secondary | ICD-10-CM | POA: Diagnosis not present

## 2022-11-11 DIAGNOSIS — Z419 Encounter for procedure for purposes other than remedying health state, unspecified: Secondary | ICD-10-CM | POA: Diagnosis not present

## 2022-12-11 DIAGNOSIS — Z419 Encounter for procedure for purposes other than remedying health state, unspecified: Secondary | ICD-10-CM | POA: Diagnosis not present

## 2022-12-30 ENCOUNTER — Other Ambulatory Visit: Payer: Self-pay | Admitting: Obstetrics and Gynecology

## 2022-12-30 DIAGNOSIS — B9689 Other specified bacterial agents as the cause of diseases classified elsewhere: Secondary | ICD-10-CM

## 2023-01-06 ENCOUNTER — Encounter (HOSPITAL_COMMUNITY): Payer: Self-pay | Admitting: Physician Assistant

## 2023-01-06 ENCOUNTER — Ambulatory Visit (HOSPITAL_COMMUNITY)
Admission: EM | Admit: 2023-01-06 | Discharge: 2023-01-06 | Disposition: A | Discharge status: Home / Self Care | Payer: Medicaid Other | Attending: Physician Assistant | Admitting: Physician Assistant

## 2023-01-06 ENCOUNTER — Ambulatory Visit (INDEPENDENT_AMBULATORY_CARE_PROVIDER_SITE_OTHER): Payer: Medicaid Other

## 2023-01-06 ENCOUNTER — Other Ambulatory Visit: Payer: Self-pay

## 2023-01-06 DIAGNOSIS — J4521 Mild intermittent asthma with (acute) exacerbation: Secondary | ICD-10-CM | POA: Diagnosis not present

## 2023-01-06 DIAGNOSIS — J069 Acute upper respiratory infection, unspecified: Secondary | ICD-10-CM

## 2023-01-06 DIAGNOSIS — Z1152 Encounter for screening for COVID-19: Secondary | ICD-10-CM | POA: Insufficient documentation

## 2023-01-06 DIAGNOSIS — R059 Cough, unspecified: Secondary | ICD-10-CM | POA: Diagnosis not present

## 2023-01-06 LAB — POCT URINE PREGNANCY: Preg Test, Ur: NEGATIVE

## 2023-01-06 MED ORDER — PREDNISONE 10 MG (21) PO TBPK: ORAL_TABLET | ORAL | 0 refills | Status: DC

## 2023-01-06 MED ORDER — AEROCHAMBER PLUS FLO-VU LARGE MISC
1.0000 | Freq: Once | Status: AC
  Administered 2023-01-06: 1

## 2023-01-06 MED ORDER — IBUPROFEN 800 MG PO TABS
ORAL_TABLET | ORAL | Status: AC
  Filled 2023-01-06: qty 1

## 2023-01-06 MED ORDER — ALBUTEROL SULFATE HFA 108 (90 BASE) MCG/ACT IN AERS
2.0000 | INHALATION_SPRAY | Freq: Once | RESPIRATORY_TRACT | Status: AC
  Administered 2023-01-06: 2 via RESPIRATORY_TRACT

## 2023-01-06 MED ORDER — PROMETHAZINE-DM 6.25-15 MG/5ML PO SYRP: 5.0000 mL | ORAL_SOLUTION | Freq: Two times a day (BID) | ORAL | 0 refills | Status: DC | PRN

## 2023-01-06 MED ORDER — ALBUTEROL SULFATE HFA 108 (90 BASE) MCG/ACT IN AERS
INHALATION_SPRAY | RESPIRATORY_TRACT | Status: AC
  Filled 2023-01-06: qty 6.7

## 2023-01-06 MED ORDER — IBUPROFEN 800 MG PO TABS
800.0000 mg | ORAL_TABLET | Freq: Once | ORAL | Status: AC
  Administered 2023-01-06: 800 mg via ORAL

## 2023-01-06 MED ORDER — AEROCHAMBER PLUS FLO-VU LARGE MISC
Status: AC
  Filled 2023-01-06: qty 1

## 2023-01-06 NOTE — ED Provider Notes (Signed)
[facility]    CSN: [id number] Arrival date & time: 01/06/23  3762      History   Chief Complaint Chief Complaint  Patient presents with   URI    Patient arrive with c/o cold like symptoms with persistent cough, HA, body aches and HA for the past 4 days.    HPI Shelly Gibbs is a 36 y.o. female.   Patient presents today with a 3 to 4-day history of URI symptoms including cough, headache, body aches, sinus pressure, chest tightness.  Denies any measurable fever but has been feeling warm.  She has been taking cold/flu multisymptom medication without improvement of symptoms.  Denies any known sick contacts.  She has had COVID twice in the past with last episode in 2021.  She has not had COVID-19 vaccination.  She does have a history of asthma and has been using her albuterol inhaler intermittently with minimal improvement of symptoms.  Denies hospitalization related to asthma.  She denies any history of seasonal allergies and does not take medication for this on a regular basis.  Denies history of smoking.  Denies any recent antibiotics or steroids.  Denies history of diabetes.  She does not believe that she is pregnant but is unsure if she could be.  She is eating and drinking normally.    Past Medical History:  Diagnosis Date   Asthma    inhaler last used "long time ago"   BV (bacterial vaginosis)    Chlamydia    Gonorrhea 08/03/2012   Headache(784.0)    Indication for care in labor or delivery 12/20/2013   Pregnancy 12/07/2013   Trichomonas infection    UTI (lower urinary tract infection)    Visit for wound check 10/28/2016   Yeast infection     Patient Active Problem List   Diagnosis Date Noted   Generalized anxiety disorder 10/04/2021   ASCUS of cervix with negative high risk HPV 06/08/2020   Bacterial vaginosis 07/30/2012   Impetigo 07/30/2012    Past Surgical History:  Procedure Laterality Date   DILATION AND CURETTAGE OF UTERUS     INDUCED  ABORTION     x 2    OB History     Gravida  9   Para  2   Term  2   Preterm  0   AB  6   Living  2      SAB  1   IAB  5   Ectopic  0   Multiple  0   Live Births  2            Home Medications    Prior to Admission medications   Medication Sig Start Date End Date Taking? Authorizing Provider  albuterol (VENTOLIN HFA) 108 (90 Base) MCG/ACT inhaler Inhale 2 puffs into the lungs every 6 (six) hours as needed for wheezing or shortness of breath.   Yes [provider]  predniSONE (STERAPRED UNI-PAK 21 TAB) 10 MG (21) TBPK tablet As directed 01/06/23  Yes Diany Formosa K, PA-C  promethazine-dextromethorphan (PROMETHAZINE-DM) 6.25-15 MG/5ML syrup Take 5 mLs by mouth 2 (two) times daily as needed for cough. 01/06/23  Yes Adriena Manfre, Noberto Retort, PA-C    Family History Family History  Problem Relation Age of Onset   Diabetes Maternal Aunt    Anesthesia problems Neg Hx    Other Neg Hx    Hearing loss Neg Hx     Social History Social History   Tobacco Use  Smoking status: Never   Smokeless tobacco: Never  Vaping Use   Vaping Use: Never used  Substance Use Topics   Alcohol use: Yes    Comment: occasional    Drug use: No     Allergies   Latex, Percocet [oxycodone-acetaminophen], and Tylenol [acetaminophen]   Review of Systems Review of Systems  Constitutional:  Positive for activity change and fever (Subjective). Negative for appetite change and fatigue.  HENT:  Positive for congestion, sinus pressure and sore throat. Negative for postnasal drip and sneezing.   Respiratory:  Positive for cough and chest tightness. Negative for shortness of breath and wheezing.   Cardiovascular:  Negative for chest pain.  Gastrointestinal:  Negative for abdominal pain, diarrhea, nausea and vomiting.  Neurological:  Positive for headaches. Negative for dizziness and light-headedness.     Physical Exam Triage Vital Signs ED Triage Vitals  Enc Vitals Group     BP  01/06/23 0938 (!) 143/95     Pulse Rate 01/06/23 0938 100     Resp 01/06/23 0938 19     Temp 01/06/23 0938 99.3 F (37.4 C)     Temp Source 01/06/23 0938 Oral     SpO2 01/06/23 0938 98 %     Weight 01/06/23 0939 200 lb 9.9 oz (91 kg)     Height 01/06/23 0939 5\' 2"  (1.575 m)     Head Circumference --      Peak Flow --      Pain Score 01/06/23 0939 6     Pain Loc --      Pain Edu? --      Excl. in GC? --    No data found.  Updated Vital Signs BP (!) 143/95 (BP Location: Right Arm)   Pulse 100   Temp 99.3 F (37.4 C) (Oral)   Resp 19   Ht 5\' 2"  (1.575 m)   Wt 200 lb 9.9 oz (91 kg)   SpO2 98%   BMI 36.69 kg/m   Visual Acuity Right Eye Distance:   Left Eye Distance:   Bilateral Distance:    Right Eye Near:   Left Eye Near:    Bilateral Near:     Physical Exam Vitals reviewed.  Constitutional:      General: She is awake. She is not in acute distress.    Appearance: Normal appearance. She is well-developed. She is not ill-appearing.     Comments: Very pleasant female appears stated age in no acute distress sitting comfortably in exam room  HENT:     Head: Normocephalic and atraumatic.     Right Ear: Tympanic membrane, ear canal and external ear normal. No middle ear effusion. Tympanic membrane is not erythematous or bulging.     Left Ear: Ear canal and external ear normal. A middle ear effusion is present. Tympanic membrane is not erythematous or bulging.     Nose:     Right Sinus: Maxillary sinus tenderness and frontal sinus tenderness present.     Left Sinus: Maxillary sinus tenderness and frontal sinus tenderness present.     Mouth/Throat:     Pharynx: Uvula midline. Posterior oropharyngeal erythema present. No oropharyngeal exudate.     Tonsils: No tonsillar exudate or tonsillar abscesses.     Comments: Erythema and drainage in posterior oropharynx Cardiovascular:     Rate and Rhythm: Normal rate and regular rhythm.     Heart sounds: Normal heart sounds, S1  normal and S2 normal. No murmur heard. Pulmonary:  Effort: Pulmonary effort is normal.     Breath sounds: Examination of the right-lower field reveals decreased breath sounds. Examination of the left-lower field reveals decreased breath sounds. Decreased breath sounds present. No wheezing, rhonchi or rales.  Psychiatric:        Behavior: Behavior is cooperative.      UC Treatments / Results  Labs (all labs ordered are listed, but only abnormal results are displayed) Labs Reviewed  SARS CORONAVIRUS 2 (TAT 6-24 HRS)  POCT URINE PREGNANCY    EKG   Radiology DG Chest 2 View  Result Date: 01/06/2023 CLINICAL DATA:  Worsening cough. EXAM: CHEST - 2 VIEW COMPARISON:  None Available. FINDINGS: Cardiac silhouette and mediastinal contours are within normal limits. The lungs are clear. No pleural effusion or pneumothorax. No acute skeletal abnormality. IMPRESSION: No active cardiopulmonary disease. Electronically Signed   By: Neita Garnet M.D.   On: 01/06/2023 10:59    Procedures Procedures (including critical care time)  Medications Ordered in UC Medications  albuterol (VENTOLIN HFA) 108 (90 Base) MCG/ACT inhaler 2 puff (2 puffs Inhalation Given 01/06/23 1008)  AeroChamber Plus Flo-Vu Large MISC 1 each (1 each Other Given 01/06/23 1008)  ibuprofen (ADVIL) tablet 800 mg (800 mg Oral Given 01/06/23 1121)    Initial Impression / Assessment and Plan / UC Course  I have reviewed the triage vital signs and the nursing notes.  Pertinent labs & imaging results that were available during my care of the patient were reviewed by me and considered in my medical decision making (see chart for details).     Patient is well-appearing, afebrile, nontoxic, nontachycardic.  She was given albuterol in clinic with improvement but not resolution of symptoms.  She was sent home with this medication and spacer with instruction to use this every 4-6 hours as needed.  I suspect she has viral illness that  has flared her asthma.  Will treat for asthma exacerbation with prednisone taper.  Discussed that she is not to take NSAIDs with this medication due to risk of GI bleeding.  She can use over-the-counter medication including Mucinex, Tylenol, Flonase.  She was given Promethazine DM for cough.  Discussed that this can be sedating and she is not to drive or drink alcohol with taking it.  Chest x-ray was obtained given decreased aeration of bilateral bases on initial exam that showed no acute cardiopulmonary disease.  No evidence of acute infection that warrant initiation of antibiotics.  We discussed that if her symptoms or not improving within a week she should return for reevaluation.  COVID testing was obtained and is pending.  Patient does have a history of asthma so is a candidate for antiviral therapy but does not have a recent metabolic panel so would need to consider molnupiravir.  Urine pregnancy was negative in clinic and she would need to be reminded that she should actively prevent pregnancy for minimum of 3 months after completing course of medication.  Discussed that if she has any worsening symptoms including chest pain, shortness of breath, weakness, worsening cough, nausea, vomiting she needs to be seen immediately.  Strict return precautions given to which she expressed understanding.  Final Clinical Impressions(s) / UC Diagnoses   Final diagnoses:  Upper respiratory tract infection, unspecified type  Mild intermittent asthma with acute exacerbation     Discharge Instructions      I believe you have a virus that is flaring your asthma.  Use your albuterol inhaler as discussed during clinic visit every  4-6 hours as needed.  Start prednisone.  Do not take NSAIDs with this medication including aspirin, ibuprofen/Advil, naproxen/Aleve.  You can use Tylenol.  Use over-the-counter medication including Mucinex, Flonase, Tylenol for additional symptom relief.  Take Promethazine DM at night for  cough.  This will make you sleepy so do not drive or drink alcohol while taking it.  We will contact you if you are positive for COVID.  If you have any worsening symptoms you need to be seen immediately including increasing shortness of breath, chest pain, nausea, vomiting, weakness.     ED Prescriptions     Medication Sig Dispense Auth. Provider   predniSONE (STERAPRED UNI-PAK 21 TAB) 10 MG (21) TBPK tablet As directed 21 tablet Mitali Shenefield K, PA-C   promethazine-dextromethorphan (PROMETHAZINE-DM) 6.25-15 MG/5ML syrup Take 5 mLs by mouth 2 (two) times daily as needed for cough. 118 mL Phares Zaccone K, PA-C      PDMP not reviewed this encounter.   Jeani Hawking, PA-C 01/06/23 1133

## 2023-01-06 NOTE — Discharge Instructions (Addendum)
I believe you have a virus that is flaring your asthma.  Use your albuterol inhaler as discussed during clinic visit every 4-6 hours as needed.  Start prednisone.  Do not take NSAIDs with this medication including aspirin, ibuprofen/Advil, naproxen/Aleve.  You can use Tylenol.  Use over-the-counter medication including Mucinex, Flonase, Tylenol for additional symptom relief.  Take Promethazine DM at night for cough.  This will make you sleepy so do not drive or drink alcohol while taking it.  We will contact you if you are positive for COVID.  If you have any worsening symptoms you need to be seen immediately including increasing shortness of breath, chest pain, nausea, vomiting, weakness.

## 2023-01-06 NOTE — ED Notes (Signed)
First dose of Ibuprofen given to the pt and pt let it fall on the floor, medication wasted and another dose given to the pt.

## 2023-01-06 NOTE — ED Triage Notes (Signed)
Patient arrive with c/o cold like symptoms with persistent cough, HA, body aches and HA for the past 4 days.

## 2023-01-07 LAB — SARS CORONAVIRUS 2 (TAT 6-24 HRS): SARS Coronavirus 2: NEGATIVE

## 2023-01-11 DIAGNOSIS — Z419 Encounter for procedure for purposes other than remedying health state, unspecified: Secondary | ICD-10-CM | POA: Diagnosis not present

## 2023-02-04 ENCOUNTER — Encounter (HOSPITAL_COMMUNITY): Payer: Self-pay

## 2023-02-04 ENCOUNTER — Emergency Department (HOSPITAL_COMMUNITY)
Admission: EM | Admit: 2023-02-04 | Discharge: 2023-02-04 | Disposition: A | Discharge status: Home / Self Care | Payer: Medicaid Other | Attending: Student | Admitting: Student

## 2023-02-04 ENCOUNTER — Emergency Department (HOSPITAL_COMMUNITY): Payer: Medicaid Other

## 2023-02-04 ENCOUNTER — Other Ambulatory Visit: Payer: Self-pay

## 2023-02-04 DIAGNOSIS — Z91040 Latex allergy status: Secondary | ICD-10-CM | POA: Insufficient documentation

## 2023-02-04 DIAGNOSIS — J45909 Unspecified asthma, uncomplicated: Secondary | ICD-10-CM | POA: Insufficient documentation

## 2023-02-04 DIAGNOSIS — M25512 Pain in left shoulder: Secondary | ICD-10-CM | POA: Insufficient documentation

## 2023-02-04 DIAGNOSIS — Z7951 Long term (current) use of inhaled steroids: Secondary | ICD-10-CM | POA: Diagnosis not present

## 2023-02-04 DIAGNOSIS — Y92410 Unspecified street and highway as the place of occurrence of the external cause: Secondary | ICD-10-CM | POA: Insufficient documentation

## 2023-02-04 DIAGNOSIS — M25519 Pain in unspecified shoulder: Secondary | ICD-10-CM | POA: Diagnosis not present

## 2023-02-04 MED ORDER — LIDOCAINE 5 % EX PTCH: 1.0000 | MEDICATED_PATCH | CUTANEOUS | 0 refills | Status: DC

## 2023-02-04 MED ORDER — LIDOCAINE 5 % EX PTCH
1.0000 | MEDICATED_PATCH | CUTANEOUS | Status: DC
  Administered 2023-02-04: 1 via TRANSDERMAL
  Filled 2023-02-04: qty 1
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
## 2023-02-10 DIAGNOSIS — Z419 Encounter for procedure for purposes other than remedying health state, unspecified: Secondary | ICD-10-CM | POA: Diagnosis not present

## 2023-03-13 DIAGNOSIS — Z419 Encounter for procedure for purposes other than remedying health state, unspecified: Secondary | ICD-10-CM | POA: Diagnosis not present

## 2023-04-13 DIAGNOSIS — Z419 Encounter for procedure for purposes other than remedying health state, unspecified: Secondary | ICD-10-CM | POA: Diagnosis not present

## 2023-05-09 ENCOUNTER — Other Ambulatory Visit: Payer: Self-pay | Admitting: Obstetrics and Gynecology

## 2023-05-09 DIAGNOSIS — B9689 Other specified bacterial agents as the cause of diseases classified elsewhere: Secondary | ICD-10-CM

## 2023-05-13 DIAGNOSIS — Z419 Encounter for procedure for purposes other than remedying health state, unspecified: Secondary | ICD-10-CM | POA: Diagnosis not present

## 2023-06-13 DIAGNOSIS — Z419 Encounter for procedure for purposes other than remedying health state, unspecified: Secondary | ICD-10-CM | POA: Diagnosis not present

## 2023-07-08 ENCOUNTER — Ambulatory Visit (HOSPITAL_COMMUNITY)
Admission: EM | Admit: 2023-07-08 | Discharge: 2023-07-08 | Disposition: A | Discharge status: Home / Self Care | Payer: Medicaid Other | Attending: Internal Medicine | Admitting: Internal Medicine

## 2023-07-08 ENCOUNTER — Encounter (HOSPITAL_COMMUNITY): Payer: Self-pay

## 2023-07-08 DIAGNOSIS — Z3202 Encounter for pregnancy test, result negative: Secondary | ICD-10-CM

## 2023-07-08 DIAGNOSIS — N898 Other specified noninflammatory disorders of vagina: Secondary | ICD-10-CM

## 2023-07-08 DIAGNOSIS — Z113 Encounter for screening for infections with a predominantly sexual mode of transmission: Secondary | ICD-10-CM

## 2023-07-08 LAB — POCT URINALYSIS DIP (MANUAL ENTRY)
Bilirubin, UA: NEGATIVE
Blood, UA: NEGATIVE
Glucose, UA: NEGATIVE mg/dL
Ketones, POC UA: NEGATIVE mg/dL
Leukocytes, UA: NEGATIVE
Nitrite, UA: NEGATIVE
Protein Ur, POC: NEGATIVE mg/dL
Spec Grav, UA: 1.025 (ref 1.010–1.025)
Urobilinogen, UA: 1 U/dL
pH, UA: 5.5 (ref 5.0–8.0)

## 2023-07-08 LAB — POCT URINE PREGNANCY: Preg Test, Ur: NEGATIVE

## 2023-07-08 NOTE — Discharge Instructions (Signed)
Pregnancy test was negative and urine was clear.  Vaginal swab is pending.  We will call when it results and send any appropriate treatment.

## 2023-07-08 NOTE — ED Provider Notes (Signed)
[facility]    CSN: [id number] Arrival date & time: 07/08/23  1653      History   Chief Complaint No chief complaint on file.   HPI Shelly Gibbs is a 37 y.o. female.   Patient presents with vaginal irritation and itching that started about 4 days ago.  She is not sure if she has had discharge.  Denies dysuria, urinary frequency, abdominal pain, pelvic pain.  Last menstrual cycle was 06/20/2023 but patient is requesting pregnancy testing.  She does not use any form of birth control.  She is sexually active but denies exposure to STD.  She used some previously prescribed MetroGel with no improvement.     Past Medical History:  Diagnosis Date   Asthma    inhaler last used "long time ago"   BV (bacterial vaginosis)    Chlamydia    Gonorrhea 08/03/2012   Headache(784.0)    Indication for care in labor or delivery 12/20/2013   Pregnancy 12/07/2013   Trichomonas infection    UTI (lower urinary tract infection)    Visit for wound check 10/28/2016   Yeast infection     Patient Active Problem List   Diagnosis Date Noted   Generalized anxiety disorder 10/04/2021   ASCUS of cervix with negative high risk HPV 06/08/2020   Bacterial vaginosis 07/30/2012   Impetigo 07/30/2012    Past Surgical History:  Procedure Laterality Date   DILATION AND CURETTAGE OF UTERUS     INDUCED ABORTION     x 2    OB History     Gravida  9   Para  2   Term  2   Preterm  0   AB  6   Living  2      SAB  1   IAB  5   Ectopic  0   Multiple  0   Live Births  2            Home Medications    Prior to Admission medications   Medication Sig Start Date End Date Taking? Authorizing Provider  metroNIDAZOLE (METROGEL) 0.75 % vaginal gel PLACE 1 APPLICATORFUL VAGINALLY AT BEDTIME FOR 5 DAYS 05/09/23  Yes Lorriane Shire, MD  albuterol (VENTOLIN HFA) 108 (90 Base) MCG/ACT inhaler Inhale 2 puffs into the lungs every 6 (six) hours as needed for wheezing or  shortness of breath.    [provider]  lidocaine (LIDODERM) 5 % Place 1 patch onto the skin daily. Remove & Discard patch within 12 hours or as directed by MD 02/04/23   Kommor, Wyn Forster, MD  predniSONE (STERAPRED UNI-PAK 21 TAB) 10 MG (21) TBPK tablet As directed 01/06/23   Raspet, Erin K, PA-C  promethazine-dextromethorphan (PROMETHAZINE-DM) 6.25-15 MG/5ML syrup Take 5 mLs by mouth 2 (two) times daily as needed for cough. 01/06/23   Raspet, Noberto Retort, PA-C    Family History Family History  Problem Relation Age of Onset   Diabetes Maternal Aunt    Anesthesia problems Neg Hx    Other Neg Hx    Hearing loss Neg Hx     Social History Social History   Tobacco Use   Smoking status: Never   Smokeless tobacco: Never  Vaping Use   Vaping status: Never Used  Substance Use Topics   Alcohol use: Yes    Comment: occasional    Drug use: No     Allergies   Latex, Percocet [oxycodone-acetaminophen], and Tylenol [acetaminophen]   Review of Systems Review  of Systems Per HPI  Physical Exam Triage Vital Signs ED Triage Vitals [07/08/23 1720]  Encounter Vitals Group     BP 132/88     Systolic BP Percentile      Diastolic BP Percentile      Pulse Rate 75     Resp 18     Temp 99.2 F (37.3 C)     Temp Source Oral     SpO2 97 %     Weight      Height      Head Circumference      Peak Flow      Pain Score      Pain Loc      Pain Education      Exclude from Growth Chart    No data found.  Updated Vital Signs BP 132/88 (BP Location: Left Arm)   Pulse 75   Temp 99.2 F (37.3 C) (Oral)   Resp 18   LMP 06/20/2023 (Approximate)   SpO2 97%   Visual Acuity Right Eye Distance:   Left Eye Distance:   Bilateral Distance:    Right Eye Near:   Left Eye Near:    Bilateral Near:     Physical Exam Constitutional:      General: She is not in acute distress.    Appearance: Normal appearance. She is not toxic-appearing or diaphoretic.  HENT:     Head: Normocephalic and  atraumatic.  Eyes:     Extraocular Movements: Extraocular movements intact.     Conjunctiva/sclera: Conjunctivae normal.  Pulmonary:     Effort: Pulmonary effort is normal.  Genitourinary:    Comments: Deferred with shared decision making. Self swab performed.  Neurological:     General: No focal deficit present.     Mental Status: She is alert and oriented to person, place, and time. Mental status is at baseline.  Psychiatric:        Mood and Affect: Mood normal.        Behavior: Behavior normal.        Thought Content: Thought content normal.        Judgment: Judgment normal.      UC Treatments / Results  Labs (all labs ordered are listed, but only abnormal results are displayed) Labs Reviewed  POCT URINALYSIS DIP (MANUAL ENTRY) - Abnormal; Notable for the following components:      Result Value   Color, UA straw (*)    All other components within normal limits  POCT URINE PREGNANCY  CERVICOVAGINAL ANCILLARY ONLY    EKG   Radiology No results found.  Procedures Procedures (including critical care time)  Medications Ordered in UC Medications - No data to display  Initial Impression / Assessment and Plan / UC Course  I have reviewed the triage vital signs and the nursing notes.  Pertinent labs & imaging results that were available during my care of the patient were reviewed by me and considered in my medical decision making (see chart for details).     Urine pregnancy test negative.  UA unremarkable.  Cervicovaginal swab pending.  Will await result prior to treatment.  Advised strict follow-up precautions if symptoms persist or worsen.  Patient verbalized understanding and was agreeable with plan. Final Clinical Impressions(s) / UC Diagnoses   Final diagnoses:  Vaginal irritation  Screening examination for venereal disease  Urine pregnancy test negative     Discharge Instructions      Pregnancy test was negative and urine was clear.  Vaginal swab is  pending.  We will call when it results and send any appropriate treatment.    ED Prescriptions   None    PDMP not reviewed this encounter.   Gustavus Bryant, [place] 07/08/23 1740

## 2023-07-08 NOTE — ED Triage Notes (Signed)
Pt presents to office for vaginal irritation x 1 week. Pt states she was taking Metrogel but her symptoms did not improve.

## 2023-07-09 ENCOUNTER — Telehealth: Payer: Self-pay

## 2023-07-09 LAB — CERVICOVAGINAL ANCILLARY ONLY
Bacterial Vaginitis (gardnerella): POSITIVE — AB
Candida Glabrata: NEGATIVE
Candida Vaginitis: POSITIVE — AB
Chlamydia: NEGATIVE
Comment: NEGATIVE
Comment: NEGATIVE
Comment: NEGATIVE
Comment: NEGATIVE
Comment: NEGATIVE
Comment: NORMAL
Neisseria Gonorrhea: NEGATIVE
Trichomonas: NEGATIVE

## 2023-07-09 MED ORDER — METRONIDAZOLE 0.75 % VA GEL: 1.0000 | Freq: Every day | VAGINAL | 0 refills | Status: AC

## 2023-07-09 MED ORDER — FLUCONAZOLE 150 MG PO TABS: 150.0000 mg | ORAL_TABLET | Freq: Once | ORAL | 0 refills | Status: AC

## 2023-07-09 NOTE — Telephone Encounter (Signed)
Per protocol, pt requires tx with metronidazole and Diflucan. Reviewed with patient, verified pharmacy, prescription sent.

## 2023-07-13 DIAGNOSIS — Z419 Encounter for procedure for purposes other than remedying health state, unspecified: Secondary | ICD-10-CM | POA: Diagnosis not present

## 2023-07-17 ENCOUNTER — Ambulatory Visit: Payer: Medicaid Other | Admitting: Obstetrics and Gynecology

## 2023-08-13 DIAGNOSIS — Z419 Encounter for procedure for purposes other than remedying health state, unspecified: Secondary | ICD-10-CM | POA: Diagnosis not present

## 2023-08-28 ENCOUNTER — Ambulatory Visit: Payer: Medicaid Other | Admitting: Obstetrics and Gynecology

## 2023-08-28 ENCOUNTER — Other Ambulatory Visit: Payer: Self-pay

## 2023-08-28 ENCOUNTER — Other Ambulatory Visit (HOSPITAL_COMMUNITY)
Admission: RE | Admit: 2023-08-28 | Discharge: 2023-08-28 | Disposition: A | Discharge status: Home / Self Care | Payer: Medicaid Other | Source: Ambulatory Visit | Attending: Obstetrics and Gynecology | Admitting: Obstetrics and Gynecology

## 2023-08-28 VITALS — BP 135/88 | HR 74 | Wt 198.0 lb

## 2023-08-28 DIAGNOSIS — Z0000 Encounter for general adult medical examination without abnormal findings: Secondary | ICD-10-CM | POA: Diagnosis not present

## 2023-08-28 DIAGNOSIS — Z3169 Encounter for other general counseling and advice on procreation: Secondary | ICD-10-CM

## 2023-08-28 NOTE — Progress Notes (Signed)
ANNUAL EXAM Patient name: Shelly Gibbs MRN [id number]  Date of birth: [birth year] Chief Complaint:   Gynecologic Exam  History of Present Illness:   Shelly Gibbs is a 38 y.o. [phone]  female being seen today for a routine annual exam.  Current complaints: annual, trying to conceive, recurrent BV  - recurrent BV after intercourse (symptoms of odor) - trying to conceive, wondering about eggs - STI vaginal swabs only - no breast or nipple concerns   Discussed the use of AI scribe software for clinical note transcription with the patient, who gave verbal consent to proceed.  History of Present Illness   The patient, with a history of recurrent bacterial vaginosis (BV), presents with concerns about a persistent odor noticed after intercourse. The odor is not present after every sexual encounter, but was recently noticed after intercourse with her long-term partner. The patient practices good hygiene, urinating before and after intercourse and ensuring both partners wash off. She uses Dove sensitive skin soap and [employer] Hammer detergent consistently. The patient also uses Carefree scented panty liners daily, which she has been advised could be contributing to the issue.  The patient has been dealing with BV for a significant period, predating her current partner. She recalls a previous treatment regimen involving boric acid pills, which she discontinued as she did not perceive any improvement. She also admits to inconsistent use of probiotics due to gastrointestinal discomfort and frequent switching of brands.  The patient is currently trying to conceive with her partner, and she has been unsuccessful for a year. She stopped receiving contraceptive shots in 2023. The patient has had children previously, suggesting fertility is not an issue on her part. She has suggested her partner get tested, as she suspects his semen may be contributing to the recurrent BV and possibly affecting her  ability to conceive.       No LMP recorded. Patient has had an injection.   The pregnancy intention screening data noted above was reviewed. Potential methods of contraception were discussed. The patient elected to proceed with No data recorded.   Last pap 06/05/20. Results were: ASCUS w/ HRHPV negative. 11/21/16-NILM, HPV neg Last mammogram: n/a Last colonoscopy: n/a STI testing: accepts GC/CT BC: none      08/29/2022   10:15 AM 10/04/2021    9:14 AM 01/18/2021   10:15 AM 11/02/2020    3:31 PM 08/21/2020    4:51 PM  Depression screen PHQ 2/9  Decreased Interest 0 0 0 0 0  Down, Depressed, Hopeless 0 0 0 0 0  PHQ - 2 Score 0 0 0 0 0  Altered sleeping 1  0 2 0  Tired, decreased energy 1  0 2 0  Change in appetite 0  0 0 0  Feeling bad or failure about yourself  0  0 0 0  Trouble concentrating 0  0 0 0  Moving slowly or fidgety/restless 0  0 0 0  Suicidal thoughts 0  0 0 0  PHQ-9 Score 2  0 4 0  Difficult doing work/chores   Not difficult at all          08/29/2022   10:15 AM 10/04/2021   10:01 AM 01/18/2021   10:15 AM 11/02/2020    3:31 PM  GAD 7 : Generalized Anxiety Score  Nervous, Anxious, on Edge 1 1 0 0  Control/stop worrying 0 1 0 1  Worry too much - different things 0 1 0 0  Trouble relaxing  0 1 0 0  Restless 0 0 0 0  Easily annoyed or irritable 0 1 0 1  Afraid - awful might happen 0 0 0 0  Total GAD 7 Score 1 5 0 2  Anxiety Difficulty  Not difficult at all Not difficult at all      Review of Systems:   Pertinent items are noted in HPI Denies any headaches, blurred vision, fatigue, shortness of breath, chest pain, abdominal pain, abnormal vaginal discharge/itching/odor/irritation, problems with periods, bowel movements, urination, or intercourse unless otherwise stated above. Pertinent History Reviewed:  Reviewed past medical,surgical, social and family history.  Reviewed problem list, medications and allergies. Physical Assessment:  There were no vitals  filed for this visit.There is no height or weight on file to calculate BMI.        Physical Examination:   General appearance - well appearing, and in no distress  Mental status - alert, oriented to person, place, and time  Psych:  She has a normal mood and affect  Skin - warm and dry, normal color, no suspicious lesions noted  Chest - effort normal, all lung fields clear to auscultation bilaterally  Heart - normal rate and regular rhythm  Neck:  midline trachea, no thyromegaly or nodules  Abdomen - soft, nontender, nondistended, no masses or organomegaly  Pelvic - VULVA: normal appearing vulva with no masses, tenderness or lesions  VAGINA: normal appearing vagina with normal color and discharge, no lesions  CERVIX: normal appearing cervix without discharge or lesions, no CMT  Thin prep pap is done with HR HPV cotesting  UTERUS: uterus is felt to be normal size, shape, consistency and nontender   ADNEXA: No adnexal masses or tenderness noted.  Extremities:  No swelling or varicosities noted  Chaperone present for exam  No results found for this or any previous visit (from the past 24 hours).  Assessment & Plan:    Assessment and Plan    Recurrent Bacterial Vaginosis Noted to have symptoms primarily after intercourse. Previously tried boric acid and probiotics with limited success. Currently using panty liners and Dove sensitive skin soap. -Consider changing to organic cotton panty liners. -Consider prolonged treatment with Metrogel if BV is confirmed on today's swab. -Partner to consider reducing smoking and marijuana use and complete semen analysis.  Infertility Trying to conceive for a year with current partner. Previously fertile with other partners. No known risk factors for infertility. -Partner to consider semen analysis. -Consider future blood test to assess ovarian function. -May have delayed return to fertility due to prior depo provera use  General Health  Maintenance -Continue with annual breast exam and pap smear. -accepts GC/CT testing today -Pap smear collected -   Will need prolonged BV treatment if swab returns positive for BV       Orders Placed This Encounter  Procedures   Anti mullerian hormone    Meds: No orders of the defined types were placed in this encounter.   Follow-up: No follow-ups on file.  Lorriane Shire, MD, FACOG Minimally Invasive Gynecologic Surgery  Obstetrics and Gynecology, [facility] for [facility], [facility] 08/29/2023

## 2023-08-29 ENCOUNTER — Encounter: Payer: Self-pay | Admitting: Obstetrics and Gynecology

## 2023-09-02 ENCOUNTER — Telehealth: Payer: Self-pay | Admitting: Lactation Services

## 2023-09-02 NOTE — Telephone Encounter (Signed)
Called Cytology to check to see if BV and yeast was added to swab. She reports that testing was added and should be resulted tomorrow afternoon. Will notify patient.

## 2023-09-03 ENCOUNTER — Other Ambulatory Visit: Payer: Self-pay | Admitting: Obstetrics and Gynecology

## 2023-09-03 DIAGNOSIS — B3731 Acute candidiasis of vulva and vagina: Secondary | ICD-10-CM

## 2023-09-03 DIAGNOSIS — B9689 Other specified bacterial agents as the cause of diseases classified elsewhere: Secondary | ICD-10-CM

## 2023-09-03 LAB — CERVICOVAGINAL ANCILLARY ONLY
Chlamydia: NEGATIVE
Comment: NEGATIVE
Comment: NEGATIVE
Comment: NEGATIVE
Comment: NEGATIVE
Comment: NEGATIVE
Comment: NORMAL
Comment: NORMAL
Comment: POSITIVE — AB
Comment: POSITIVE — AB
Neisseria Gonorrhea: NEGATIVE
Trichomonas: NEGATIVE

## 2023-09-03 MED ORDER — FLUCONAZOLE 150 MG PO TABS
150.0000 mg | ORAL_TABLET | Freq: Once | ORAL | 1 refills | Status: DC
Start: 2023-09-03 — End: 2023-11-25

## 2023-09-03 MED ORDER — METRONIDAZOLE 0.75 % VA GEL
1.0000 | Freq: Every day | VAGINAL | 5 refills | Status: DC
Start: 2023-09-03 — End: 2024-04-08

## 2023-09-04 LAB — CYTOLOGY - PAP
Adequacy: ABSENT
Chlamydia: NEGATIVE
Comment: NEGATIVE
Comment: NEGATIVE
Comment: NEGATIVE
Comment: NORMAL
Diagnosis: NEGATIVE
High risk HPV: NEGATIVE
Neisseria Gonorrhea: NEGATIVE
Trichomonas: NEGATIVE

## 2023-09-13 DIAGNOSIS — Z419 Encounter for procedure for purposes other than remedying health state, unspecified: Secondary | ICD-10-CM | POA: Diagnosis not present

## 2023-10-11 DIAGNOSIS — Z419 Encounter for procedure for purposes other than remedying health state, unspecified: Secondary | ICD-10-CM | POA: Diagnosis not present

## 2023-11-22 DIAGNOSIS — Z419 Encounter for procedure for purposes other than remedying health state, unspecified: Secondary | ICD-10-CM | POA: Diagnosis not present

## 2023-11-25 ENCOUNTER — Other Ambulatory Visit: Payer: Self-pay | Admitting: Obstetrics and Gynecology

## 2023-11-25 DIAGNOSIS — B3731 Acute candidiasis of vulva and vagina: Secondary | ICD-10-CM

## 2023-12-22 DIAGNOSIS — Z419 Encounter for procedure for purposes other than remedying health state, unspecified: Secondary | ICD-10-CM | POA: Diagnosis not present

## 2024-01-13 ENCOUNTER — Other Ambulatory Visit: Payer: Self-pay | Admitting: Obstetrics and Gynecology

## 2024-01-13 DIAGNOSIS — B3731 Acute candidiasis of vulva and vagina: Secondary | ICD-10-CM

## 2024-01-22 DIAGNOSIS — Z419 Encounter for procedure for purposes other than remedying health state, unspecified: Secondary | ICD-10-CM | POA: Diagnosis not present

## 2024-02-21 DIAGNOSIS — Z419 Encounter for procedure for purposes other than remedying health state, unspecified: Secondary | ICD-10-CM | POA: Diagnosis not present

## 2024-02-22 ENCOUNTER — Emergency Department (HOSPITAL_COMMUNITY)
Admission: EM | Admit: 2024-02-22 | Discharge: 2024-02-22 | Disposition: A | Discharge status: Home / Self Care | Attending: Emergency Medicine | Admitting: Emergency Medicine

## 2024-02-22 ENCOUNTER — Encounter (HOSPITAL_COMMUNITY): Payer: Self-pay

## 2024-02-22 ENCOUNTER — Emergency Department (HOSPITAL_COMMUNITY)

## 2024-02-22 ENCOUNTER — Other Ambulatory Visit: Payer: Self-pay

## 2024-02-22 DIAGNOSIS — S62654A Nondisplaced fracture of medial phalanx of right ring finger, initial encounter for closed fracture: Secondary | ICD-10-CM | POA: Diagnosis not present

## 2024-02-22 DIAGNOSIS — Z91040 Latex allergy status: Secondary | ICD-10-CM | POA: Insufficient documentation

## 2024-02-22 DIAGNOSIS — W208XXA Other cause of strike by thrown, projected or falling object, initial encounter: Secondary | ICD-10-CM | POA: Diagnosis not present

## 2024-02-22 DIAGNOSIS — M79644 Pain in right finger(s): Secondary | ICD-10-CM | POA: Diagnosis present

## 2024-02-22 DIAGNOSIS — S62624A Displaced fracture of medial phalanx of right ring finger, initial encounter for closed fracture: Secondary | ICD-10-CM | POA: Diagnosis not present

## 2024-02-22 NOTE — ED Triage Notes (Signed)
 Pt injured her right ring finger on Friday while moving some boxes. Finger is red and swollen

## 2024-02-22 NOTE — ED Provider Notes (Signed)
 [facility] AT [facility] Provider Note   CSN: [id number] Arrival date & time: 02/22/24  9077     Patient presents with: Finger Injury   Shelly Gibbs is a 38 y.o. female.   38 year old female with prior medical history as detailed below presents for evaluation.  Patient reports that she dropped a box on her right hand on Friday.  Her right ring finger was angulated and painful.  She straightened her finger.  She presents now requesting an x-ray to check for a broken finger.  She denies other complaint.  She is right-handed.  She denies any bleeding.  The history is provided by the patient and medical records.       Prior to Admission medications   Medication Sig Start Date End Date Taking? Authorizing Provider  albuterol  (VENTOLIN  HFA) 108 (90 Base) MCG/ACT inhaler Inhale 2 puffs into the lungs every 6 (six) hours as needed for wheezing or shortness of breath.    [provider]  metroNIDAZOLE  (METROGEL ) 0.75 % vaginal gel PLACE 1 APPLICATORFUL VAGINALLY AT BEDTIME FOR 5 DAYS 05/09/23   Ajewole, Christana, MD  metroNIDAZOLE  (METROGEL ) 0.75 % vaginal gel Place 1 Applicatorful vaginally at bedtime. Apply one applicatorful to vagina at bedtime for 7 days, then twice a week for 4 months. 09/03/23   Jeralyn Crutch, MD    Allergies: Latex, Percocet [oxycodone -acetaminophen ], and Tylenol  [acetaminophen ]    Review of Systems  All other systems reviewed and are negative.   Updated Vital Signs BP (!) 152/104 (BP Location: Right Arm)   Pulse 78   Temp 99 F (37.2 C)   Resp 18   SpO2 98%   Physical Exam Vitals and nursing note reviewed.  Constitutional:      General: She is not in acute distress.    Appearance: Normal appearance. She is well-developed.  HENT:     Head: Normocephalic and atraumatic.  Eyes:     Conjunctiva/sclera: Conjunctivae normal.     Pupils: Pupils are equal, round, and reactive to light.  Cardiovascular:      Rate and Rhythm: Normal rate and regular rhythm.     Heart sounds: Normal heart sounds.  Pulmonary:     Effort: Pulmonary effort is normal. No respiratory distress.     Breath sounds: Normal breath sounds.  Abdominal:     General: There is no distension.     Palpations: Abdomen is soft.     Tenderness: There is no abdominal tenderness.  Musculoskeletal:        General: No deformity. Normal range of motion.     Cervical back: Normal range of motion and neck supple.     Comments: Ecchymosis to the right ring finger.  Range of motion is limited secondary to pain.  No open wound.  Distal ring finger is neurovascularly intact  Skin:    General: Skin is warm and dry.  Neurological:     General: No focal deficit present.     Mental Status: She is alert and oriented to person, place, and time.     (all labs ordered are listed, but only abnormal results are displayed) Labs Reviewed - No data to display  EKG: None  Radiology: No results found.   Procedures   Medications Ordered in the ED - No data to display  Medical Decision Making Amount and/or Complexity of Data Reviewed Radiology: ordered.    Medical Screen Complete  This patient presented to the ED with complaint of finger injury.  This complaint involves an extensive number of treatment options. The initial differential diagnosis includes, but is not limited to, contusion, sprain, fracture  This presentation is: Acute, Self-Limited, Previously Undiagnosed, and Uncertain Prognosis  Patient with finger injury and fracture identified on x-ray.  Finger splinted.  Patient understands need to follow-up with hand specialist for definitive treatment.  Importance close follow-up stressed.  Strict return precautions given understood.  Problem List / ED Course:  Finger fracture   Disposition:  After consideration of the diagnostic results and the patients response to treatment, I feel  that the patent would benefit from close outpatient follow-up.       Final diagnoses:  Closed nondisplaced fracture of middle phalanx of right ring finger, initial encounter    ED Discharge Orders     None          Laurice Maude BROCKS, MD 02/22/24 [phone]

## 2024-02-22 NOTE — Discharge Instructions (Addendum)
 Return for any problem.  Follow-up closely with Dr. Romona for further treatment of your broken finger.  Keep splint in place as instructed.

## 2024-02-22 NOTE — ED Notes (Signed)
 Pt given ice pack

## 2024-03-01 DIAGNOSIS — S62624A Displaced fracture of medial phalanx of right ring finger, initial encounter for closed fracture: Secondary | ICD-10-CM | POA: Diagnosis not present

## 2024-03-16 DIAGNOSIS — S62624D Displaced fracture of medial phalanx of right ring finger, subsequent encounter for fracture with routine healing: Secondary | ICD-10-CM | POA: Diagnosis not present

## 2024-03-22 ENCOUNTER — Ambulatory Visit: Admitting: Obstetrics and Gynecology

## 2024-03-23 DIAGNOSIS — Z419 Encounter for procedure for purposes other than remedying health state, unspecified: Secondary | ICD-10-CM | POA: Diagnosis not present

## 2024-03-24 DIAGNOSIS — M25641 Stiffness of right hand, not elsewhere classified: Secondary | ICD-10-CM | POA: Diagnosis not present

## 2024-03-24 DIAGNOSIS — M79644 Pain in right finger(s): Secondary | ICD-10-CM | POA: Diagnosis not present

## 2024-03-27 ENCOUNTER — Other Ambulatory Visit: Payer: Self-pay

## 2024-03-27 ENCOUNTER — Ambulatory Visit (HOSPITAL_COMMUNITY)
Admission: EM | Admit: 2024-03-27 | Discharge: 2024-03-27 | Disposition: A | Discharge status: Home / Self Care | Attending: Family Medicine | Admitting: Family Medicine

## 2024-03-27 ENCOUNTER — Encounter (HOSPITAL_COMMUNITY): Payer: Self-pay | Admitting: *Deleted

## 2024-03-27 DIAGNOSIS — N760 Acute vaginitis: Secondary | ICD-10-CM | POA: Diagnosis not present

## 2024-03-27 MED ORDER — KETOCONAZOLE 2 % EX CREA: 1.0000 | TOPICAL_CREAM | Freq: Every day | CUTANEOUS | 0 refills | Status: DC

## 2024-03-27 MED ORDER — FLUCONAZOLE 100 MG PO TABS: ORAL_TABLET | ORAL | 0 refills | Status: DC

## 2024-03-27 NOTE — ED Triage Notes (Signed)
 PT reports vaginal itching started on 8/7 . Pt reports she has a vag. Yeast infection . Pt has used the OTC med for yeast with out relief.

## 2024-03-27 NOTE — Discharge Instructions (Addendum)
 Staff will notify you if there is anything positive on the swab (or on the blood work if that has been taken at this visit). It can take 2-3 days for the tests to result, depending on the day of the week your test was taken. You will only be notified if there are any positives on the testing; test results will also go to your MyChart if you are signed up for MyChart.   Fluconazole  100 mg--2 tablets by mouth the first day then 1 tablet by mouth daily  for 6 more days.  Ketoconazole  cream--apply once daily to the external sore rash area till better.

## 2024-03-27 NOTE — ED Provider Notes (Signed)
 [facility]    CSN: [id number] Arrival date & time: 03/27/24  1349      History   Chief Complaint Chief Complaint  Patient presents with   Vaginal Itching    HPI Shelly Gibbs is a 38 y.o. female.    Vaginal Itching  Here for vaginal itching that began on August 7.  She has noticed some erythematous rash in her perineal area also.  She is having thick white cottage cheese type discharge.  No pelvic pain.  Last menstrual cycle also began August 7.  She is allergic to Tylenol  and Percocet which cause hives.  Metronidazole  makes her vomit.  She has been using MetroGel  vaginal treatment twice a week to try to prevent BV infection.  She did use a 1 day Monistat treatment that seemed to soothe the symptoms while it was present, but the symptoms worsened after the cream wore off.  Past Medical History:  Diagnosis Date   Asthma    inhaler last used long time ago   BV (bacterial vaginosis)    Chlamydia    Gonorrhea 08/03/2012   Headache(784.0)    Indication for care in labor or delivery 12/20/2013   Pregnancy 12/07/2013   Trichomonas infection    UTI (lower urinary tract infection)    Visit for wound check 10/28/2016   Yeast infection     Patient Active Problem List   Diagnosis Date Noted   Generalized anxiety disorder 10/04/2021   ASCUS of cervix with negative high risk HPV 06/08/2020   Bacterial vaginosis 07/30/2012   Impetigo 07/30/2012    Past Surgical History:  Procedure Laterality Date   DILATION AND CURETTAGE OF UTERUS     INDUCED ABORTION     x 2    OB History     Gravida  9   Para  2   Term  2   Preterm  0   AB  6   Living  2      SAB  1   IAB  5   Ectopic  0   Multiple  0   Live Births  2            Home Medications    Prior to Admission medications   Medication Sig Start Date End Date Taking? Authorizing Provider  fluconazole  (DIFLUCAN ) 100 MG tablet 2 tablets by mouth the first day, then 1 tablet  daily for 6 more days. 03/27/24  Yes Vonna Sharlet POUR, MD  ketoconazole  (NIZORAL ) 2 % cream Apply 1 Application topically daily. To affected area till better 03/27/24  Yes Yandiel Bergum, Sharlet POUR, MD  albuterol  (VENTOLIN  HFA) 108 (90 Base) MCG/ACT inhaler Inhale 2 puffs into the lungs every 6 (six) hours as needed for wheezing or shortness of breath.    [provider]  metroNIDAZOLE  (METROGEL ) 0.75 % vaginal gel PLACE 1 APPLICATORFUL VAGINALLY AT BEDTIME FOR 5 DAYS 05/09/23   Ajewole, Christana, MD  metroNIDAZOLE  (METROGEL ) 0.75 % vaginal gel Place 1 Applicatorful vaginally at bedtime. Apply one applicatorful to vagina at bedtime for 7 days, then twice a week for 4 months. 09/03/23   Jeralyn Crutch, MD    Family History Family History  Problem Relation Age of Onset   Diabetes Maternal Aunt    Anesthesia problems Neg Hx    Other Neg Hx    Hearing loss Neg Hx     Social History Social History   Tobacco Use   Smoking status: Never   Smokeless tobacco: Never  Vaping Use   Vaping status: Never Used  Substance Use Topics   Alcohol use: Yes    Comment: occasional    Drug use: No     Allergies   Latex, Percocet [oxycodone -acetaminophen ], and Tylenol  [acetaminophen ]   Review of Systems Review of Systems   Physical Exam Triage Vital Signs ED Triage Vitals  Encounter Vitals Group     BP 03/27/24 1431 137/89     Girls Systolic BP Percentile --      Girls Diastolic BP Percentile --      Boys Systolic BP Percentile --      Boys Diastolic BP Percentile --      Pulse Rate 03/27/24 1431 77     Resp 03/27/24 1431 16     Temp 03/27/24 1431 98.2 F (36.8 C)     Temp src --      SpO2 03/27/24 1431 100 %     Weight --      Height --      Head Circumference --      Peak Flow --      Pain Score 03/27/24 1429 0     Pain Loc --      Pain Education --      Exclude from Growth Chart --    No data found.  Updated Vital Signs BP 137/89   Pulse 77   Temp 98.2 F (36.8 C)    Resp 16   LMP 03/18/2024   SpO2 100%   Visual Acuity Right Eye Distance:   Left Eye Distance:   Bilateral Distance:    Right Eye Near:   Left Eye Near:    Bilateral Near:     Physical Exam Vitals reviewed.  Constitutional:      General: She is not in acute distress.    Appearance: She is not toxic-appearing.  HENT:     Mouth/Throat:     Mouth: Mucous membranes are moist.  Genitourinary:    Comments: Chaperone is present during the time of exam.  There is a confluent erythematous rash over her mons pubis and her perineum in the inguinal creases.  No weeping and no sign of secondary infection. Skin:    Coloration: Skin is not jaundiced or pale.  Neurological:     Mental Status: She is alert and oriented to person, place, and time.  Psychiatric:        Behavior: Behavior normal.      UC Treatments / Results  Labs (all labs ordered are listed, but only abnormal results are displayed) Labs Reviewed  CERVICOVAGINAL ANCILLARY ONLY    EKG   Radiology No results found.  Procedures Procedures (including critical care time)  Medications Ordered in UC Medications - No data to display  Initial Impression / Assessment and Plan / UC Course  I have reviewed the triage vital signs and the nursing notes.  Pertinent labs & imaging results that were available during my care of the patient were reviewed by me and considered in my medical decision making (see chart for details).     Vaginal self swab is done, and we will notify of any positives on that and treat per protocol.  Fluconazole  was sent in for it daily treatment for 1 week for vaginal candidiasis and candidiasis of the perineum.    Final Clinical Impressions(s) / UC Diagnoses   Final diagnoses:  Acute vaginitis     Discharge Instructions      Staff will notify you if there is  anything positive on the swab (or on the blood work if that has been taken at this visit). It can take 2-3 days for the tests  to result, depending on the day of the week your test was taken. You will only be notified if there are any positives on the testing; test results will also go to your MyChart if you are signed up for MyChart.   Fluconazole  100 mg--2 tablets by mouth the first day then 1 tablet by mouth daily  for 6 more days.  Ketoconazole  cream--apply once daily to the external sore rash area till better.      ED Prescriptions     Medication Sig Dispense Auth. Provider   fluconazole  (DIFLUCAN ) 100 MG tablet 2 tablets by mouth the first day, then 1 tablet daily for 6 more days. 8 tablet Mechele Kittleson K, MD   ketoconazole  (NIZORAL ) 2 % cream Apply 1 Application topically daily. To affected area till better 30 g Vonna Sharlet POUR, MD      PDMP not reviewed this encounter.   Vonna Sharlet POUR, MD 03/27/24 [phone]

## 2024-03-29 ENCOUNTER — Telehealth (HOSPITAL_COMMUNITY): Payer: Self-pay

## 2024-03-29 LAB — CERVICOVAGINAL ANCILLARY ONLY
Bacterial Vaginitis (gardnerella): NEGATIVE
Candida Glabrata: NEGATIVE
Candida Vaginitis: NEGATIVE
Chlamydia: NEGATIVE
Comment: NEGATIVE
Comment: NEGATIVE
Comment: NEGATIVE
Comment: NEGATIVE
Comment: NEGATIVE
Comment: NORMAL
Neisseria Gonorrhea: NEGATIVE
Trichomonas: NEGATIVE

## 2024-03-29 NOTE — Telephone Encounter (Signed)
 Received call from pt stating pharmacy told her rx had missing info. Per pharmacy, insurance requires site of application and grams per application. Per P. Banister, MD,  2 grams per application, applied once daily to perineum and inguinal areas.  LVM with VO with pharmacy.  Pt updated per patient access.

## 2024-04-01 ENCOUNTER — Telehealth (HOSPITAL_COMMUNITY): Payer: Self-pay

## 2024-04-01 NOTE — Telephone Encounter (Signed)
 Pt notified of Dr. Claude recommendations to return to clinic d/t medication not helping.

## 2024-04-02 ENCOUNTER — Ambulatory Visit (HOSPITAL_COMMUNITY)
Admission: EM | Admit: 2024-04-02 | Discharge: 2024-04-02 | Disposition: A | Discharge status: Home / Self Care | Attending: Emergency Medicine | Admitting: Emergency Medicine

## 2024-04-02 ENCOUNTER — Encounter (HOSPITAL_COMMUNITY): Payer: Self-pay

## 2024-04-02 DIAGNOSIS — B9689 Other specified bacterial agents as the cause of diseases classified elsewhere: Secondary | ICD-10-CM

## 2024-04-02 DIAGNOSIS — N760 Acute vaginitis: Secondary | ICD-10-CM | POA: Diagnosis not present

## 2024-04-02 DIAGNOSIS — L089 Local infection of the skin and subcutaneous tissue, unspecified: Secondary | ICD-10-CM | POA: Diagnosis not present

## 2024-04-02 MED ORDER — CEPHALEXIN 500 MG PO CAPS: 500.0000 mg | ORAL_CAPSULE | Freq: Two times a day (BID) | ORAL | 0 refills | Status: DC

## 2024-04-02 NOTE — Discharge Instructions (Addendum)
 I am treating you for a bacterial infection of the skin Take the Keflex  -- 1 pill, twice daily (every 12 hours), for 7 days in a row. Take with food to avoid upset stomach. Finish all the pills!  Use cool compress on the area. Wash gently with water Do not apply other lotions or soaps Avoid tight fitting clothing  Please call your ob/gyn for a follow up visit if symptoms persist

## 2024-04-02 NOTE — ED Triage Notes (Signed)
 Patient here today with c/o vaginal itching X 15 days. Patient was here on 03/27/2024 and completed treatment with no relief. Patient had also tried using Monistat with no relief.

## 2024-04-02 NOTE — ED Provider Notes (Signed)
 [facility]    CSN: [id number] Arrival date & time: 04/02/24  1820     History   Chief Complaint Chief Complaint  Patient presents with   Vaginal Itching    HPI Shelly Gibbs Record is a 38 y.o. female.  Labial itching for 2 weeks Seen here 6 days ago. Had confluent erythematous rash in groin area, treated with topical ketoconazole , and oral fluconazole  -- daily for 6 days. Reports still itching and burning She used monistat as well  No discharge No urinary symptoms No fevers  Her cytology swab 6 days ago was negative for all  Past Medical History:  Diagnosis Date   Asthma    inhaler last used long time ago   BV (bacterial vaginosis)    Chlamydia    Gonorrhea 08/03/2012   Headache(784.0)    Indication for care in labor or delivery 12/20/2013   Pregnancy 12/07/2013   Trichomonas infection    UTI (lower urinary tract infection)    Visit for wound check 10/28/2016   Yeast infection     Patient Active Problem List   Diagnosis Date Noted   Generalized anxiety disorder 10/04/2021   ASCUS of cervix with negative high risk HPV 06/08/2020   Bacterial vaginosis 07/30/2012   Impetigo 07/30/2012    Past Surgical History:  Procedure Laterality Date   DILATION AND CURETTAGE OF UTERUS     INDUCED ABORTION     x 2    OB History     Gravida  9   Para  2   Term  2   Preterm  0   AB  6   Living  2      SAB  1   IAB  5   Ectopic  0   Multiple  0   Live Births  2            Home Medications    Prior to Admission medications   Medication Sig Start Date End Date Taking? Authorizing Provider  cephALEXin  (KEFLEX ) 500 MG capsule Take 1 capsule (500 mg total) by mouth 2 (two) times daily for 7 days. 04/02/24 04/09/24 Yes Davyon Fisch, Asberry, PA-C  albuterol  (VENTOLIN  HFA) 108 (90 Base) MCG/ACT inhaler Inhale 2 puffs into the lungs every 6 (six) hours as needed for wheezing or shortness of breath.    [provider]  ketoconazole   (NIZORAL ) 2 % cream Apply 1 Application topically daily. To affected area till better 03/27/24   Vonna Sharlet POUR, MD  metroNIDAZOLE  (METROGEL ) 0.75 % vaginal gel PLACE 1 APPLICATORFUL VAGINALLY AT BEDTIME FOR 5 DAYS 05/09/23   Ajewole, Christana, MD  metroNIDAZOLE  (METROGEL ) 0.75 % vaginal gel Place 1 Applicatorful vaginally at bedtime. Apply one applicatorful to vagina at bedtime for 7 days, then twice a week for 4 months. 09/03/23   Jeralyn Crutch, MD    Family History Family History  Problem Relation Age of Onset   Diabetes Maternal Aunt    Anesthesia problems Neg Hx    Other Neg Hx    Hearing loss Neg Hx     Social History Social History   Tobacco Use   Smoking status: Never   Smokeless tobacco: Never  Vaping Use   Vaping status: Never Used  Substance Use Topics   Alcohol use: Yes    Comment: occasional    Drug use: No     Allergies   Latex, Percocet [oxycodone -acetaminophen ], and Tylenol  [acetaminophen ]   Review of Systems Review of Systems  As per  HPI  Physical Exam Triage Vital Signs ED Triage Vitals  Encounter Vitals Group     BP 04/02/24 1916 (!) 154/88     Girls Systolic BP Percentile --      Girls Diastolic BP Percentile --      Boys Systolic BP Percentile --      Boys Diastolic BP Percentile --      Pulse Rate 04/02/24 1916 71     Resp 04/02/24 1916 16     Temp 04/02/24 1916 98.7 F (37.1 C)     Temp Source 04/02/24 1916 Oral     SpO2 04/02/24 1916 100 %     Weight --      Height --      Head Circumference --      Peak Flow --      Pain Score 04/02/24 1919 0     Pain Loc --      Pain Education --      Exclude from Growth Chart --    No data found.  Updated Vital Signs BP (!) 154/88 (BP Location: Right Arm)   Pulse 71   Temp 98.7 F (37.1 C) (Oral)   Resp 16   LMP 03/18/2024 (Exact Date)   SpO2 100%   Breastfeeding No    Physical Exam Vitals and nursing note reviewed. Exam conducted with a chaperone present Dairl PEAK).   Constitutional:      General: She is not in acute distress.    Appearance: Normal appearance.  HENT:     Mouth/Throat:     Pharynx: Oropharynx is clear.  Cardiovascular:     Rate and Rhythm: Normal rate and regular rhythm.     Heart sounds: Normal heart sounds.  Pulmonary:     Effort: Pulmonary effort is normal.     Breath sounds: Normal breath sounds.  Genitourinary:    Comments: Rash present. Erythematous in the folds of groin and over labia. No lesions or ulcerations  Neurological:     Mental Status: She is alert and oriented to person, place, and time.     UC Treatments / Results  Labs (all labs ordered are listed, but only abnormal results are displayed) Labs Reviewed - No data to display  EKG  Radiology No results found.  Procedures Procedures   Medications Ordered in UC Medications - No data to display  Initial Impression / Assessment and Plan / UC Course  I have reviewed the triage vital signs and the nursing notes.  Pertinent labs & imaging results that were available during my care of the patient were reviewed by me and considered in my medical decision making (see chart for details).  At this point I suspect the yeast has been treated, and this is now bacterial  Cover with keflex  BID x 7 days Advised avoid scratching. Try cool compress Other supportive care and precautions Ob/gyn follow up recommended if persisting   Final Clinical Impressions(s) / UC Diagnoses   Final diagnoses:  Bacterial skin infection  Acute vaginitis     Discharge Instructions      I am treating you for a bacterial infection of the skin Take the Keflex  -- 1 pill, twice daily (every 12 hours), for 7 days in a row. Take with food to avoid upset stomach. Finish all the pills!  Use cool compress on the area. Wash gently with water Do not apply other lotions or soaps Avoid tight fitting clothing  Please call your ob/gyn for a follow up visit  if symptoms persist       ED Prescriptions     Medication Sig Dispense Auth. Provider   cephALEXin  (KEFLEX ) 500 MG capsule Take 1 capsule (500 mg total) by mouth 2 (two) times daily for 7 days. 14 capsule Eleanor Dimichele, Asberry, PA-C      PDMP not reviewed this encounter.   Jeryl Asberry RIGGERS 04/02/24 2042

## 2024-04-06 ENCOUNTER — Telehealth (HOSPITAL_COMMUNITY): Payer: Self-pay

## 2024-04-06 NOTE — Telephone Encounter (Signed)
 Pt called, states on her 5th day of antibiotics with no relief. States has 2 days left. Pt recommended to return to clinic or f/u with PCP.

## 2024-04-08 ENCOUNTER — Encounter (HOSPITAL_COMMUNITY): Payer: Self-pay

## 2024-04-08 ENCOUNTER — Ambulatory Visit (HOSPITAL_COMMUNITY)
Admission: EM | Admit: 2024-04-08 | Discharge: 2024-04-08 | Disposition: A | Discharge status: Home / Self Care | Attending: Nurse Practitioner | Admitting: Nurse Practitioner

## 2024-04-08 ENCOUNTER — Telehealth (HOSPITAL_COMMUNITY): Payer: Self-pay | Admitting: Nurse Practitioner

## 2024-04-08 DIAGNOSIS — L304 Erythema intertrigo: Secondary | ICD-10-CM

## 2024-04-08 MED ORDER — ZINC OXIDE 20 % EX OINT: TOPICAL_OINTMENT | CUTANEOUS | 0 refills | Status: DC

## 2024-04-08 MED ORDER — HYDROCORTISONE 2.5 % EX OINT: TOPICAL_OINTMENT | Freq: Two times a day (BID) | CUTANEOUS | 0 refills | Status: AC

## 2024-04-08 MED ORDER — CLOTRIMAZOLE 1 % EX CREA: TOPICAL_CREAM | CUTANEOUS | 0 refills | Status: AC

## 2024-04-08 MED ORDER — NYSTATIN 100000 UNIT/GM EX OINT
1.0000 | TOPICAL_OINTMENT | Freq: Two times a day (BID) | CUTANEOUS | 0 refills | Status: AC
Start: 2024-04-08 — End: ?

## 2024-04-08 MED ORDER — FLUCONAZOLE 150 MG PO TABS: 150.0000 mg | ORAL_TABLET | ORAL | 0 refills | Status: AC

## 2024-04-08 NOTE — ED Triage Notes (Signed)
 Patient presenting with vaginal itching and burning onset 3 weeks ago.  Prescriptions or OTC medications tried: Yes- antibiotics for 7 days    with no relief

## 2024-04-08 NOTE — ED Provider Notes (Signed)
 [facility]    CSN: [id number] Arrival date & time: 04/08/24  9163      History   Chief Complaint Chief Complaint  Patient presents with   Vaginal Itching    HPI Shelly Gibbs is a 38 y.o. female.   Discussed the use of AI scribe software for clinical note transcription with the patient, who gave verbal consent to proceed.   The patient presents for her third visit to urgent care with persistent vaginal itching that began in early August, coinciding with the onset of her menstrual period. She describes the area as very itchy and uncomfortable, with symptoms worsening at night.  On her initial visit on 03/27/2024, she reported both itching and vaginal discharge. She was diagnosed with acute vaginitis and treated with a seven-day course of fluconazole  and ketoconazole  topical cream. Cervical swab at that time was negative for gonorrhea, chlamydia, bacterial vaginosis, and yeast. She reports no symptom relief with this regimen. On 04/02/2024 she returned for reevaluation, at which time vaginal discharge had resolved but itching persisted without associated urinary symptoms. She was diagnosed with possible bacterial infection and prescribed a seven-day course of cephalexin . On 04/06/2024, while still taking antibiotics, she called the clinic reporting no improvement and was advised to follow up either here or with her primary care provider.  She presents today for further reevaluation, noting ongoing itching and general discomfort in the affected area without discharge, urinary symptoms, or fever. She denies pain or burning with urination. To manage symptoms, she has avoided wearing underwear and has limited cleansing to water only. She expresses frustration over the lack of improvement despite multiple courses of treatment.  The following portions of the patient's history were reviewed and updated as appropriate: allergies, current medications, past family history, past medical  history, past social history, past surgical history, and problem list.     Past Medical History:  Diagnosis Date   Asthma    inhaler last used long time ago   BV (bacterial vaginosis)    Chlamydia    Gonorrhea 08/03/2012   Headache(784.0)    Indication for care in labor or delivery 12/20/2013   Pregnancy 12/07/2013   Trichomonas infection    UTI (lower urinary tract infection)    Visit for wound check 10/28/2016   Yeast infection     Patient Active Problem List   Diagnosis Date Noted   Generalized anxiety disorder 10/04/2021   ASCUS of cervix with negative high risk HPV 06/08/2020   Bacterial vaginosis 07/30/2012   Impetigo 07/30/2012    Past Surgical History:  Procedure Laterality Date   DILATION AND CURETTAGE OF UTERUS     INDUCED ABORTION     x 2    OB History     Gravida  9   Para  2   Term  2   Preterm  0   AB  6   Living  2      SAB  1   IAB  5   Ectopic  0   Multiple  0   Live Births  2            Home Medications    Prior to Admission medications   Medication Sig Start Date End Date Taking? Authorizing Provider  clotrimazole  (LOTRIMIN ) 1 % cream Apply thin layer to affected areas twice a day for up to 3 weeks 04/08/24  Yes Iola Lukes, FNP  fluconazole  (DIFLUCAN ) 150 MG tablet Take 1 tablet (150 mg total) by  mouth once a week for 3 doses. Take 1 tablet by mouth every Thursday for 3 weeks 04/08/24 04/23/24 Yes Iola Lukes, FNP  hydrocortisone  2.5%-nystatin -zinc  oxide 20% 1:1:1 ointment mixture Apply a thin layer to the affected area twice daily for 1 week, then once daily for 1 week, and then every other day for 1 week. 04/08/24  Yes Iola Lukes, FNP  albuterol  (VENTOLIN  HFA) 108 (90 Base) MCG/ACT inhaler Inhale 2 puffs into the lungs every 6 (six) hours as needed for wheezing or shortness of breath.    [provider]    Family History Family History  Problem Relation Age of Onset   Diabetes Maternal  Aunt    Anesthesia problems Neg Hx    Other Neg Hx    Hearing loss Neg Hx     Social History Social History   Tobacco Use   Smoking status: Never   Smokeless tobacco: Never  Vaping Use   Vaping status: Never Used  Substance Use Topics   Alcohol use: Yes    Comment: occasional    Drug use: No     Allergies   Latex, Percocet [oxycodone -acetaminophen ], and Tylenol  [acetaminophen ]   Review of Systems Review of Systems  Constitutional:  Negative for fever.  Genitourinary:  Negative for dysuria, menstrual problem (LMP: 03/18/24), pelvic pain, vaginal discharge and vaginal pain.       Vaginal irritation and itching. No odor.   All other systems reviewed and are negative.    Physical Exam Triage Vital Signs ED Triage Vitals  Encounter Vitals Group     BP 04/08/24 0956 (!) 126/90     Girls Systolic BP Percentile --      Girls Diastolic BP Percentile --      Boys Systolic BP Percentile --      Boys Diastolic BP Percentile --      Pulse Rate 04/08/24 0956 69     Resp 04/08/24 0956 16     Temp 04/08/24 0956 98.1 F (36.7 C)     Temp Source 04/08/24 0956 Oral     SpO2 04/08/24 0956 100 %     Weight --      Height --      Head Circumference --      Peak Flow --      Pain Score 04/08/24 0955 10     Pain Loc --      Pain Education --      Exclude from Growth Chart --    No data found.  Updated Vital Signs BP (!) 126/90 (BP Location: Right Arm)   Pulse 69   Temp 98.1 F (36.7 C) (Oral)   Resp 16   LMP 03/18/2024 (Exact Date)   SpO2 100%   Visual Acuity Right Eye Distance:   Left Eye Distance:   Bilateral Distance:    Right Eye Near:   Left Eye Near:    Bilateral Near:     Physical Exam Vitals reviewed. Exam conducted with a chaperone present Bo Pounds, CMA.).  Constitutional:      General: She is awake. She is not in acute distress.    Appearance: Normal appearance. She is well-developed. She is not ill-appearing, toxic-appearing or diaphoretic.   HENT:     Head: Normocephalic.     Right Ear: Hearing normal.     Left Ear: Hearing normal.     Nose: Nose normal.     Mouth/Throat:     Mouth: Mucous membranes are moist.  Eyes:  General: Vision grossly intact.     Conjunctiva/sclera: Conjunctivae normal.  Cardiovascular:     Rate and Rhythm: Normal rate and regular rhythm.     Heart sounds: Normal heart sounds.  Pulmonary:     Effort: Pulmonary effort is normal.     Breath sounds: Normal breath sounds and air entry.  Genitourinary:     Comments: Dry, raised, hyperpigmented rash in patchy distribution within the bilateral inguinal creases. The rash extends into the perineal region. There is no erythema or moisture noted. The anus, labia majora, and mons pubis are spared. No open lesions, drainage, or signs of secondary infection are present. Musculoskeletal:        General: Normal range of motion.     Cervical back: Full passive range of motion without pain, normal range of motion and neck supple.  Skin:    General: Skin is warm and dry.  Neurological:     General: No focal deficit present.     Mental Status: She is alert and oriented to person, place, and time.  Psychiatric:        Speech: Speech normal.        Behavior: Behavior is cooperative.      UC Treatments / Results  Labs (all labs ordered are listed, but only abnormal results are displayed) Labs Reviewed - No data to display  EKG   Radiology No results found.  Procedures Procedures (including critical care time)  Medications Ordered in UC Medications - No data to display  Initial Impression / Assessment and Plan / UC Course  I have reviewed the triage vital signs and the nursing notes.  Pertinent labs & imaging results that were available during my care of the patient were reviewed by me and considered in my medical decision making (see chart for details).    Patient presents with bilateral intertriginous dermatitis that began in early August  in association with menstruation. She was initially treated for yeast infection with a seven-day course of oral and topical antifungal therapy, which was ineffective and worsened local moisture. She was later diagnosed with cellulitis on August 22 and treated with cephalexin  for one week without improvement. She continues to experience itching, worse at night, and persistent discomfort. Examination today shows intertriginous dermatitis without discharge or odor. Current management includes clotrimazole  cream applied twice daily for up to three weeks and fluconazole  150 mg once weekly for three weeks. A combination cream containing a low-potency corticosteroid, nystatin , and zinc  oxide was also prescribed to provide both antifungal coverage and barrier protection, to be applied twice daily for one week, then once daily for one week, then every other day for one week. Patient was instructed not to apply both creams simultaneously and to wait at least 30 minutes between applications. Supportive measures were reviewed, including avoidance of fragranced soaps or products, avoiding panties in favor of loose female boxers to reduce moisture, patting area dry after showering, and using a hair dryer on cool setting to keep the area dry. Symptoms are expected to resolve within two weeks but may take up to four. She was advised to monitor closely and follow up with gynecology or dermatology if symptoms do not improve or worsen. Emergency precautions were discussed for any new severe pain, spreading redness, fever, or drainage.  Today's evaluation has revealed no signs of a dangerous process. Discussed diagnosis with patient and/or guardian. Patient and/or guardian aware of their diagnosis, possible red flag symptoms to watch out for and need for close follow  up. Patient and/or guardian understands verbal and written discharge instructions. Patient and/or guardian comfortable with plan and disposition.  Patient and/or guardian  has a clear mental status at this time, good insight into illness (after discussion and teaching) and has clear judgment to make decisions regarding their care  Documentation was completed with the aid of voice recognition software. Transcription may contain typographical errors.  Final Clinical Impressions(s) / UC Diagnoses   Final diagnoses:  Intertriginous dermatitis associated with moisture     Discharge Instructions      You were seen today for an ongoing rash in the skin folds of your groin. This condition is called intertriginous dermatitis and can be caused by yeast, bacteria, or irritation from moisture. You were previously treated with antifungal medication and antibiotics without improvement, and today a new treatment plan has been started.  You have been prescribed two creams. Apply the clotrimazole  cream to the affected areas twice a day for up to three weeks. You have also been prescribed a combination cream with a mild steroid, nystatin , and zinc  oxide. Use this twice a day for one week, then once daily for one week, then every other day for one week. Do not apply the creams at the same time. Instead, apply one cream and wait about 30 minutes before applying the other so that both medicines can work effectively. You were also prescribed fluconazole  tablets to take once a week for three weeks.  At home, it is very important to keep the area as dry as possible. Avoid wearing panties and instead wear loose-fitting cotton boxers to help reduce moisture. After showering, gently pat the area dry and consider using a hand-held hair dryer on the cool setting to ensure dryness. Avoid soaps, lotions, or products with fragrance in this area, as they can make symptoms worse. You may notice improvement within two weeks, but it can take up to four weeks for the rash to completely clear.  Follow up with your gynecologist or a dermatologist if your symptoms are not improving after completing  treatment. You should go to the emergency department if you develop severe or rapidly worsening pain, spreading redness, swelling, fever, or drainage from the rash, as these could be signs of a more serious infection.     ED Prescriptions     Medication Sig Dispense Auth. Provider   hydrocortisone  2.5%-nystatin -zinc  oxide 20% 1:1:1 ointment mixture Apply a thin layer to the affected area twice daily for 1 week, then once daily for 1 week, and then every other day for 1 week. 240 g Iola Lukes, FNP   clotrimazole  (LOTRIMIN ) 1 % cream Apply thin layer to affected areas twice a day for up to 3 weeks 15 g Iola Lukes, FNP   fluconazole  (DIFLUCAN ) 150 MG tablet Take 1 tablet (150 mg total) by mouth once a week for 3 doses. Take 1 tablet by mouth every Thursday for 3 weeks 3 tablet Iola Lukes, FNP      PDMP not reviewed this encounter.   Iola Lukes, [place] 04/08/24 [phone]

## 2024-04-08 NOTE — Discharge Instructions (Addendum)
 You were seen today for an ongoing rash in the skin folds of your groin. This condition is called intertriginous dermatitis and can be caused by yeast, bacteria, or irritation from moisture. You were previously treated with antifungal medication and antibiotics without improvement, and today a new treatment plan has been started.  You have been prescribed two creams. Apply the clotrimazole  cream to the affected areas twice a day for up to three weeks. You have also been prescribed a combination cream with a mild steroid, nystatin , and zinc  oxide. Use this twice a day for one week, then once daily for one week, then every other day for one week. Do not apply the creams at the same time. Instead, apply one cream and wait about 30 minutes before applying the other so that both medicines can work effectively. You were also prescribed fluconazole  tablets to take once a week for three weeks.  At home, it is very important to keep the area as dry as possible. Avoid wearing panties and instead wear loose-fitting cotton boxers to help reduce moisture. After showering, gently pat the area dry and consider using a hand-held hair dryer on the cool setting to ensure dryness. Avoid soaps, lotions, or products with fragrance in this area, as they can make symptoms worse. You may notice improvement within two weeks, but it can take up to four weeks for the rash to completely clear.  Follow up with your gynecologist or a dermatologist if your symptoms are not improving after completing treatment. You should go to the emergency department if you develop severe or rapidly worsening pain, spreading redness, swelling, fever, or drainage from the rash, as these could be signs of a more serious infection.

## 2024-04-08 NOTE — Telephone Encounter (Signed)
 Received a phone call from the patient's pharmacy regarding issues with prescribed medications. Pharmacy technician reported that the patient became upset when informed that the compounded hydrocortisone /nystatin /zinc  oxide cream would need to be ordered and would cost approximately $55. During this interaction, the patient reportedly declined this prescription along with the other two prescribed medications.  The pharmacy technician inquired if hydrocortisone  and nystatin  could be ordered separately, with instructions for the patient to purchase zinc  oxide over the counter and mix the medications herself. I informed the technician that I did not want the patient mixing the medications on her own, as the compound requires a specific 1:1:1 formulation. Given the chronic nature of her condition, it is important that treatment be administered exactly as written without patient modification. The technician was instructed to inform the patient that she should either take the medication as prescribed or follow up with a specialist for alternative treatment options.  The pharmacy technician later returned the call after speaking with the pharmacist and patient. The pharmacist indicated that he could prepare the compound if the prescriptions for hydrocortisone  and nystatin  were submitted separately, as he already had zinc  oxide available. The patient reportedly agreed to this plan and also agreed to proceed with the other two prescribed medications. Prescriptions were adjusted accordingly to accommodate this arrangement.

## 2024-04-20 DIAGNOSIS — M79641 Pain in right hand: Secondary | ICD-10-CM | POA: Diagnosis not present

## 2024-04-20 DIAGNOSIS — S62624A Displaced fracture of medial phalanx of right ring finger, initial encounter for closed fracture: Secondary | ICD-10-CM | POA: Diagnosis not present

## 2024-04-23 DIAGNOSIS — Z419 Encounter for procedure for purposes other than remedying health state, unspecified: Secondary | ICD-10-CM | POA: Diagnosis not present

## 2024-05-18 ENCOUNTER — Other Ambulatory Visit: Payer: Self-pay | Admitting: Obstetrics and Gynecology

## 2024-05-18 DIAGNOSIS — B9689 Other specified bacterial agents as the cause of diseases classified elsewhere: Secondary | ICD-10-CM

## 2024-05-28 ENCOUNTER — Ambulatory Visit (INDEPENDENT_AMBULATORY_CARE_PROVIDER_SITE_OTHER)

## 2024-05-28 ENCOUNTER — Other Ambulatory Visit (HOSPITAL_COMMUNITY)
Admission: RE | Admit: 2024-05-28 | Discharge: 2024-05-28 | Disposition: A | Discharge status: Home / Self Care | Source: Ambulatory Visit

## 2024-05-28 VITALS — BP 131/86 | HR 75 | Ht 62.0 in | Wt 203.5 lb

## 2024-05-28 DIAGNOSIS — Z3202 Encounter for pregnancy test, result negative: Secondary | ICD-10-CM | POA: Diagnosis not present

## 2024-05-28 DIAGNOSIS — B3731 Acute candidiasis of vulva and vagina: Secondary | ICD-10-CM | POA: Insufficient documentation

## 2024-05-28 LAB — POCT PREGNANCY, URINE: Preg Test, Ur: NEGATIVE

## 2024-05-28 MED ORDER — MICONAZOLE NITRATE 2 % VA CREA: TOPICAL_CREAM | VAGINAL | 2 refills | Status: AC

## 2024-05-28 MED ORDER — FLUCONAZOLE 150 MG PO TABS: 150.0000 mg | ORAL_TABLET | Freq: Once | ORAL | 0 refills | Status: AC

## 2024-05-28 NOTE — Progress Notes (Signed)
   GYNECOLOGY PROGRESS NOTE  History:  38 y.o. [id number] presents to [facility] office today for problem gyn visit. She reports itching that started on her thighs and vulva and is now inside of the vagina. This originally started August 7th on the first day of her period with itching in the groin. She also notes using Arnette on August 11th. She presented to urgent care on August 16th and was given 7 day of yeast infection medication.  She didn't notice any change by the 5th day of treatment. At that point, she started noting moisture and worsening itching. She spoke with urgent care over the phone and they told her to re-present because she was concerned that she could have cellulitis.  She went back to urgent care on 8/22 and was given an antibiotic for presumed cellulitis. She took all 7 days of the medication and noted no improvement. She then went back on 8/28 and was given cream for presumed fungal infection, including clotrimazole , hydrocortisone -nystatin -zinc  oxide, and fluconazole  pills, which she took for 3 weeks.   She denies h/a, dizziness, shortness of breath, chest pain, n/v, or fever/chills.    The following portions of the patient's history were reviewed and updated as appropriate: allergies, current medications, past family history, past medical history, past social history, past surgical history and problem list. Last pap smear on 08/28/2023 was normal, negative HRHPV.  Health Maintenance Due  Topic Date Due   DTaP/Tdap/Td (1 - Tdap) Never done   Pneumococcal Vaccine (1 of 2 - PCV) Never done   Hepatitis B Vaccines 19-59 Average Risk (1 of 3 - 19+ 3-dose series) Never done   HPV VACCINES (1 - 3-dose SCDM series) Never done   Influenza Vaccine  Never done     Review of Systems:  Pertinent items are noted in HPI.   Objective:  Physical Exam Blood pressure 131/86, pulse 75, height 5' 2 (1.575 m), weight 203 lb 8 oz (92.3 kg), last menstrual period 05/10/2024. VS reviewed, nursing note  reviewed,  Constitutional: well developed, well nourished, no distress HEENT: normocephalic CV: Warm and well-perfused Pulm/chest wall: normal effort Breast Exam: deferred Neuro: alert and oriented x 3 Skin: warm, dry Psych: affect normal Pelvic exam: Completed in the presence of a chaperone Towana Mulch), Bilateral patches noted in groin folds with pruritus to touch, non-tender, rest of skin on vulva and groin warm and dry, no rash, no erythema   Assessment & Plan:  1. Vulvovaginal candidiasis (Primary) Has been seen multiple times in August for groin infections, initial infection likely fungal infection exacerbated by Nair application prior to first visit to urgent care. Now presenting with subjectively mild fungal infection. Advised to discontinue other creams that she has been taking sporadically, will treat with miconazole cream and fluconazole  pill per patient preference. Counseled on lifestyle changes, provided handout. Advised to re-present after 1 week if no improvement, also counseled to make follow-up visit for annual visit in 1 month.   - fluconazole  (DIFLUCAN ) 150 MG tablet; Take 1 tablet (150 mg total) by mouth once for 1 dose. Can take additional dose three days later if symptoms persist  Dispense: 2 tablet; Refill: 0 - miconazole (MONISTAT 7) 2 % vaginal cream; Apply to affected areas for seven days  Dispense: 45 g; Refill: 2 - Cervicovaginal ancillary only( [place])   Return in about 1 month (around 06/28/2024) for Annual visit.   Charlie DELENA Courts, MD 10:48 AM

## 2024-05-31 ENCOUNTER — Ambulatory Visit: Payer: Self-pay

## 2024-05-31 LAB — CERVICOVAGINAL ANCILLARY ONLY
Bacterial Vaginitis (gardnerella): POSITIVE — AB
Candida Glabrata: NEGATIVE
Candida Vaginitis: NEGATIVE
Chlamydia: NEGATIVE
Comment: NEGATIVE
Comment: NEGATIVE
Comment: NEGATIVE
Comment: NEGATIVE
Comment: NEGATIVE
Comment: NORMAL
Neisseria Gonorrhea: NEGATIVE
Trichomonas: NEGATIVE

## 2024-06-22 DIAGNOSIS — S62624D Displaced fracture of medial phalanx of right ring finger, subsequent encounter for fracture with routine healing: Secondary | ICD-10-CM | POA: Diagnosis not present

## 2024-07-22 ENCOUNTER — Other Ambulatory Visit (HOSPITAL_COMMUNITY)
Admission: RE | Admit: 2024-07-22 | Discharge: 2024-07-22 | Disposition: A | Discharge status: Home / Self Care | Source: Ambulatory Visit | Attending: Obstetrics and Gynecology | Admitting: Obstetrics and Gynecology

## 2024-07-22 ENCOUNTER — Other Ambulatory Visit: Payer: Self-pay

## 2024-07-22 ENCOUNTER — Encounter: Payer: Self-pay | Admitting: Obstetrics and Gynecology

## 2024-07-22 ENCOUNTER — Ambulatory Visit: Admitting: Obstetrics and Gynecology

## 2024-07-22 VITALS — BP 138/98 | HR 80 | Ht 62.0 in | Wt 201.9 lb

## 2024-07-22 DIAGNOSIS — L292 Pruritus vulvae: Secondary | ICD-10-CM | POA: Diagnosis not present

## 2024-07-22 DIAGNOSIS — N898 Other specified noninflammatory disorders of vagina: Secondary | ICD-10-CM | POA: Insufficient documentation

## 2024-07-22 DIAGNOSIS — Z113 Encounter for screening for infections with a predominantly sexual mode of transmission: Secondary | ICD-10-CM

## 2024-07-22 DIAGNOSIS — Z3202 Encounter for pregnancy test, result negative: Secondary | ICD-10-CM | POA: Diagnosis not present

## 2024-07-22 LAB — POCT PREGNANCY, URINE: Preg Test, Ur: NEGATIVE

## 2024-07-22 MED ORDER — TRIAMCINOLONE ACETONIDE 0.5 % EX OINT: 1.0000 | TOPICAL_OINTMENT | Freq: Two times a day (BID) | CUTANEOUS | 3 refills | Status: DC

## 2024-07-22 MED ORDER — HYDROXYZINE HCL 25 MG PO TABS: 25.0000 mg | ORAL_TABLET | Freq: Four times a day (QID) | ORAL | 2 refills | Status: DC | PRN

## 2024-07-22 NOTE — Progress Notes (Unsigned)
 GYNECOLOGY VISIT  Patient name: Shelly Gibbs MRN [id number]  Date of birth: Dec 18, 1985 Chief Complaint:   Gynecologic Exam (No pap needed) and Vaginal Itching (Patient states Puffy thighs, maybe some discharge, itchy above clitorus where labia majora meet. Had a fungus on thighs and was irritated when she used nair. She saw the provider in August.)   History:  Shelly Gibbs has been having vulvovaginal itching. In August, itching started on the first day of cycle. Suspected due to hair re-growth and then did nair but continued having itching and burning. Symptoms did not improved. Went to UC 3x; 1. External yeast and given anti-fungal treatment; 2. Returned and given ABX for cellulitis; 3. Itching and warm to touch, told it was a fungus and given a 3 creams and to rotate for 1 week and taper over the following 2 weeks. Initially soothin the itching and once stopped the itching resumed. If it doesn't work, told she should see her doctor. Was seen in office in October and told that she applied chemical to fungus that cuased it to worsen. Given OTC yeast cream and feels that it has made it worse, and started having itching on the inside. Applied the cream tot he outside where the skin has changed; the skin is moist and very itching. For her menses, using tampons; no pads. Switched to sensitive skin detergent - was using arm and hammer with fragrance. No other changes prior to the onset of symptoms. Was still using the metrogel  when it started. Every time she has sex, it knocks it back off. No longer having sex with that partner  Reports night time itching and notes scratching overnight. Had to pay for something over the counter. Still has a lot of the mixed cream remaining.   With priror crem - each was applied twice a day but as soon as she stopped it stopped would stop itching again.   In 2020 shaved - got really large bumps on the vulva; has been using nair since then. Used a different  nair than she usually uses. Typically uses the pinks     The following portions of the patient's history were reviewed and updated as appropriate: allergies, current medications, past family history, past medical history, past social history, past surgical history and problem list.   Health Maintenance:   Last pap     Component Value Date/Time   DIAGPAP  08/28/2023 1139    - Negative for intraepithelial lesion or malignancy (NILM)   DIAGPAP (A) 06/05/2020 1006    - Atypical squamous cells of undetermined significance (ASC-US )   DIAGPAP  11/21/2016 0000    NEGATIVE FOR INTRAEPITHELIAL LESIONS OR MALIGNANCY. BENIGN REACTIVE/REPARATIVE CHANGES.   HPVHIGH Negative 08/28/2023 1139   HPVHIGH Negative 06/05/2020 1006   ADEQPAP  08/28/2023 1139    Satisfactory for evaluation; transformation zone component ABSENT.   ADEQPAP  06/05/2020 1006    Satisfactory for evaluation; transformation zone component PRESENT.   ADEQPAP  11/21/2016 0000    Satisfactory for evaluation  endocervical/transformation zone component PRESENT.    Health Maintenance  Topic Date Due   DTaP/Tdap/Td vaccine (1 - Tdap) Never done   Pneumococcal Vaccine (1 of 2 - PCV) Never done   Hepatitis B Vaccine (1 of 3 - 19+ 3-dose series) Never done   HPV Vaccine (1 - 3-dose SCDM series) Never done   Flu Shot  Never done   Pap with HPV screening  08/27/2028   Hepatitis C Screening  Completed  HIV Screening  Completed   Meningitis B Vaccine  Aged Out   COVID-19 Vaccine  Discontinued      Review of Systems:  {Ros - complete:30496} Comprehensive review of systems was otherwise negative.   Objective:  Physical Exam BP (!) 138/98 (BP Location: Right Arm, Patient Position: Sitting, Cuff Size: Normal)   Pulse 80   Ht 5' 2 (1.575 m)   Wt 201 lb 14.4 oz (91.6 kg)   LMP 07/07/2024 (Exact Date)   BMI 36.93 kg/m    Physical Exam   Labs and Imaging No results found.     Assessment & Plan:  Assessment and  Plan Assessment & Plan        *** Routine preventative health maintenance measures emphasized.  Carter Quarry, MD [facility] for [facility], [facility]

## 2024-07-23 LAB — CERVICOVAGINAL ANCILLARY ONLY
Bacterial Vaginitis (gardnerella): POSITIVE — AB
Candida Glabrata: NEGATIVE
Candida Vaginitis: NEGATIVE
Chlamydia: NEGATIVE
Comment: NEGATIVE
Comment: NEGATIVE
Comment: NEGATIVE
Comment: NEGATIVE
Comment: NEGATIVE
Comment: NORMAL
Neisseria Gonorrhea: NEGATIVE
Trichomonas: NEGATIVE

## 2024-07-26 ENCOUNTER — Ambulatory Visit: Payer: Self-pay | Admitting: Obstetrics and Gynecology

## 2024-07-26 DIAGNOSIS — N898 Other specified noninflammatory disorders of vagina: Secondary | ICD-10-CM

## 2024-07-26 DIAGNOSIS — B9689 Other specified bacterial agents as the cause of diseases classified elsewhere: Secondary | ICD-10-CM

## 2024-07-26 MED ORDER — METRONIDAZOLE 500 MG PO TABS: 500.0000 mg | ORAL_TABLET | Freq: Two times a day (BID) | ORAL | 0 refills | Status: DC

## 2024-07-28 MED ORDER — CLINDAMYCIN HCL 300 MG PO CAPS: 300.0000 mg | ORAL_CAPSULE | Freq: Two times a day (BID) | ORAL | 0 refills | Status: AC

## 2024-07-28 MED ORDER — TINIDAZOLE 500 MG PO TABS: 1.0000 g | ORAL_TABLET | Freq: Every day | ORAL | 0 refills | Status: DC

## 2024-07-30 ENCOUNTER — Other Ambulatory Visit: Payer: Self-pay

## 2024-07-30 DIAGNOSIS — B9689 Other specified bacterial agents as the cause of diseases classified elsewhere: Secondary | ICD-10-CM

## 2024-07-30 MED ORDER — CLINDAMYCIN HCL 300 MG PO CAPS: 300.0000 mg | ORAL_CAPSULE | Freq: Two times a day (BID) | ORAL | 0 refills | Status: AC

## 2024-09-08 ENCOUNTER — Other Ambulatory Visit (HOSPITAL_COMMUNITY)
Admission: RE | Admit: 2024-09-08 | Discharge: 2024-09-08 | Disposition: A | Discharge status: Home / Self Care | Source: Ambulatory Visit | Attending: Obstetrics and Gynecology | Admitting: Obstetrics and Gynecology

## 2024-09-08 ENCOUNTER — Ambulatory Visit: Payer: Self-pay | Admitting: Obstetrics and Gynecology

## 2024-09-08 ENCOUNTER — Encounter: Payer: Self-pay | Admitting: Obstetrics and Gynecology

## 2024-09-08 ENCOUNTER — Other Ambulatory Visit: Payer: Self-pay

## 2024-09-08 VITALS — BP 142/89 | HR 63 | Wt 206.0 lb

## 2024-09-08 DIAGNOSIS — L292 Pruritus vulvae: Secondary | ICD-10-CM | POA: Insufficient documentation

## 2024-09-08 DIAGNOSIS — N898 Other specified noninflammatory disorders of vagina: Secondary | ICD-10-CM

## 2024-09-08 MED ORDER — TRIAMCINOLONE ACETONIDE 0.5 % EX OINT: 1.0000 | TOPICAL_OINTMENT | Freq: Two times a day (BID) | CUTANEOUS | 3 refills | Status: AC

## 2024-09-08 MED ORDER — HYDROXYZINE HCL 25 MG PO TABS: 25.0000 mg | ORAL_TABLET | Freq: Four times a day (QID) | ORAL | 2 refills | Status: AC | PRN

## 2024-09-08 NOTE — Patient Instructions (Addendum)
Follow up with your primary care provider about your blood pressure

## 2024-09-08 NOTE — Progress Notes (Signed)
 "   GYNECOLOGY VISIT  Patient name: Shelly Gibbs MRN [id number]  Date of birth: 1986/07/24 Chief Complaint:   Follow-up  History:  Shelly Gibbs here for BV and vulvar itching follow up. Continued using the compounded cream and the but picked up the clindamycin  and the yeast infectinon. Not having daily itching but right now having the itching. Currently using the cream every 2 days. Did not pick up the hydroxyzine . When taking benadryl  - dizzy and nauseaous. Feels the clindamycin  didn't help the BV and still having discharge with intermitttent odor  Metrogel  will usually help. Clindamycin  didn't help  Boric acid keeps it clear - will use it for 7 days and then symptoms subside for some time but eventually recur Intercourse and period are the triggers of symptom Previously did prolonged metrogel  course   The following portions of the patient's history were reviewed and updated as appropriate: allergies, current medications, past family history, past medical history, past social history, past surgical history and problem list.   Health Maintenance:   Last pap     Component Value Date/Time   DIAGPAP  08/28/2023 1139    - Negative for intraepithelial lesion or malignancy (NILM)   DIAGPAP (A) 06/05/2020 1006    - Atypical squamous cells of undetermined significance (ASC-US )   DIAGPAP  11/21/2016 0000    NEGATIVE FOR INTRAEPITHELIAL LESIONS OR MALIGNANCY. BENIGN REACTIVE/REPARATIVE CHANGES.   HPVHIGH Negative 08/28/2023 1139   HPVHIGH Negative 06/05/2020 1006   ADEQPAP  08/28/2023 1139    Satisfactory for evaluation; transformation zone component ABSENT.   ADEQPAP  06/05/2020 1006    Satisfactory for evaluation; transformation zone component PRESENT.   ADEQPAP  11/21/2016 0000    Satisfactory for evaluation  endocervical/transformation zone component PRESENT.    Health Maintenance  Topic Date Due   DTaP/Tdap/Td vaccine (1 - Tdap) Never done   Pneumococcal Vaccine (1 of 2 -  PCV) Never done   Hepatitis B Vaccine (1 of 3 - 19+ 3-dose series) Never done   Flu Shot  Never done   Pap with HPV screening  08/27/2028   HPV Vaccine (No Doses Required) Completed   Hepatitis C Screening  Completed   HIV Screening  Completed   Meningitis B Vaccine  Aged Out   COVID-19 Vaccine  Discontinued      Review of Systems:  Pertinent items are noted in HPI. Comprehensive review of systems was otherwise negative.   Objective:  Physical Exam BP (!) 152/95   Pulse 68   Wt 206 lb (93.4 kg)   LMP 08/25/2024   BMI 37.68 kg/m    Physical Exam Vitals and nursing note reviewed. Exam conducted with a chaperone present.  Constitutional:      Appearance: Normal appearance.  HENT:     Head: Normocephalic and atraumatic.  Pulmonary:     Effort: Pulmonary effort is normal.  Genitourinary:     Comments: Bilateral lower groin folds with slight whitening of skin down to perianal skin Remainder of vulva appears normal  Skin:    General: Skin is warm and dry.  Neurological:     General: No focal deficit present.     Mental Status: She is alert.  Psychiatric:        Mood and Affect: Mood normal.        Behavior: Behavior normal.        Thought Content: Thought content normal.        Judgment: Judgment normal.  Assessment & Plan:  1. Vulvar itching (Primary) New prescriptions sent for topical steroid and anti-histamine. 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 Also discussed use of aquaphor as barrier to help with itching and anti-histamine to help with sleep.   - hydrOXYzine  (ATARAX ) 25 MG tablet; Take 1 tablet (25 mg total) by mouth every 6 (six) hours as needed for itching.  Dispense: 30 tablet; Refill: 2 - triamcinolone  ointment (KENALOG ) 0.5 %; Apply 1 Application topically 2 (two) times daily.  Dispense: 30 g; Refill: 3 - Cervicovaginal ancillary only( )  2. Vaginal  discharge Having recurrent swab positive BV. Typically responds well to metrogel  and appears to be triggered by menses and intercourse. Feels symptoms not improved with clindamycin  and not able to keep oral metronidazole  down. Discussed trial of condom use with intercourse, and may be of benefit to have partner treated. Also noted use of water-only douche or singular application of boric acid at end of menses to get rid of any od blood that remains in the vaginal canal. Swab collected today.    Carter Quarry, MD Minimally Invasive Gynecologic [facility] "

## 2024-09-09 ENCOUNTER — Ambulatory Visit: Payer: Self-pay | Admitting: Obstetrics and Gynecology

## 2024-09-09 DIAGNOSIS — B9689 Other specified bacterial agents as the cause of diseases classified elsewhere: Secondary | ICD-10-CM

## 2024-09-09 LAB — CERVICOVAGINAL ANCILLARY ONLY
Bacterial Vaginitis (gardnerella): POSITIVE — AB
Candida Glabrata: NEGATIVE
Candida Vaginitis: NEGATIVE
Chlamydia: NEGATIVE
Comment: NEGATIVE
Comment: NEGATIVE
Comment: NEGATIVE
Comment: NEGATIVE
Comment: NORMAL
Neisseria Gonorrhea: NEGATIVE

## 2024-09-09 MED ORDER — METRONIDAZOLE 0.75 % VA GEL: 1.0000 | Freq: Every day | VAGINAL | 1 refills | Status: AC

## 2024-09-17 ENCOUNTER — Other Ambulatory Visit: Payer: Self-pay

## 2024-09-17 MED ORDER — FLUCONAZOLE 150 MG PO TABS: 150.0000 mg | ORAL_TABLET | Freq: Once | ORAL | 1 refills | Status: AC
